# Patient Record
Sex: Male | Born: 1980 | Race: Black or African American | Hispanic: No | Marital: Single | Smoking: Former smoker
Health system: Southern US, Community
[De-identification: ages and names within clinical notes are randomized; demographics above are authoritative.]

## PROBLEM LIST (undated history)

## (undated) DIAGNOSIS — A5401 Gonococcal cystitis and urethritis, unspecified: Secondary | ICD-10-CM

## (undated) DIAGNOSIS — Z8659 Personal history of other mental and behavioral disorders: Secondary | ICD-10-CM

## (undated) DIAGNOSIS — R29898 Other symptoms and signs involving the musculoskeletal system: Secondary | ICD-10-CM

## (undated) DIAGNOSIS — F319 Bipolar disorder, unspecified: Secondary | ICD-10-CM

## (undated) DIAGNOSIS — F209 Schizophrenia, unspecified: Secondary | ICD-10-CM

## (undated) DIAGNOSIS — F259 Schizoaffective disorder, unspecified: Secondary | ICD-10-CM

## (undated) DIAGNOSIS — Z7252 High risk homosexual behavior: Secondary | ICD-10-CM

## (undated) DIAGNOSIS — H7290 Unspecified perforation of tympanic membrane, unspecified ear: Secondary | ICD-10-CM

## (undated) DIAGNOSIS — R001 Bradycardia, unspecified: Secondary | ICD-10-CM

## (undated) HISTORY — DX: Personal history of other mental and behavioral disorders: Z86.59

## (undated) HISTORY — DX: Unspecified perforation of tympanic membrane, unspecified ear: H72.90

## (undated) HISTORY — DX: Bradycardia, unspecified: R00.1

## (undated) HISTORY — DX: Gonococcal cystitis and urethritis, unspecified: A54.01

## (undated) HISTORY — DX: High risk homosexual behavior: Z72.52

## (undated) HISTORY — DX: Other symptoms and signs involving the musculoskeletal system: R29.898

---
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
## 2002-03-30 ENCOUNTER — Encounter: Admission: RE | Admit: 2002-03-30 | Discharge: 2002-03-30 | Payer: Self-pay | Admitting: Internal Medicine

## 2004-07-06 ENCOUNTER — Emergency Department (HOSPITAL_COMMUNITY): Admission: EM | Admit: 2004-07-06 | Discharge: 2004-07-06 | Payer: Self-pay | Admitting: Emergency Medicine

## 2005-01-14 ENCOUNTER — Emergency Department (HOSPITAL_COMMUNITY): Admission: EM | Admit: 2005-01-14 | Discharge: 2005-01-14 | Payer: Self-pay | Admitting: Family Medicine

## 2005-01-21 ENCOUNTER — Emergency Department (HOSPITAL_COMMUNITY): Admission: EM | Admit: 2005-01-21 | Discharge: 2005-01-21 | Payer: Self-pay | Admitting: Family Medicine

## 2005-01-22 ENCOUNTER — Ambulatory Visit: Payer: Self-pay | Admitting: Internal Medicine

## 2005-06-14 ENCOUNTER — Emergency Department (HOSPITAL_COMMUNITY): Admission: EM | Admit: 2005-06-14 | Discharge: 2005-06-14 | Payer: Self-pay | Admitting: Family Medicine

## 2005-06-23 ENCOUNTER — Emergency Department (HOSPITAL_COMMUNITY): Admission: EM | Admit: 2005-06-23 | Discharge: 2005-06-23 | Payer: Self-pay | Admitting: Family Medicine

## 2006-03-02 ENCOUNTER — Ambulatory Visit: Payer: Self-pay | Admitting: Internal Medicine

## 2006-03-02 ENCOUNTER — Ambulatory Visit (HOSPITAL_COMMUNITY): Admission: RE | Admit: 2006-03-02 | Discharge: 2006-03-02 | Payer: Self-pay | Admitting: Internal Medicine

## 2006-11-01 DIAGNOSIS — A5400 Gonococcal infection of lower genitourinary tract, unspecified: Secondary | ICD-10-CM | POA: Insufficient documentation

## 2006-11-01 DIAGNOSIS — I498 Other specified cardiac arrhythmias: Secondary | ICD-10-CM

## 2007-03-16 ENCOUNTER — Encounter (INDEPENDENT_AMBULATORY_CARE_PROVIDER_SITE_OTHER): Payer: Self-pay | Admitting: Unknown Physician Specialty

## 2007-03-16 ENCOUNTER — Ambulatory Visit: Payer: Self-pay | Admitting: Internal Medicine

## 2007-03-16 DIAGNOSIS — F250 Schizoaffective disorder, bipolar type: Secondary | ICD-10-CM

## 2007-03-16 DIAGNOSIS — F259 Schizoaffective disorder, unspecified: Secondary | ICD-10-CM

## 2007-03-16 DIAGNOSIS — Z9189 Other specified personal risk factors, not elsewhere classified: Secondary | ICD-10-CM

## 2007-03-16 HISTORY — DX: Schizoaffective disorder, unspecified: F25.9

## 2008-01-22 ENCOUNTER — Encounter (INDEPENDENT_AMBULATORY_CARE_PROVIDER_SITE_OTHER): Payer: Self-pay | Admitting: *Deleted

## 2008-01-30 ENCOUNTER — Ambulatory Visit: Payer: Self-pay

## 2008-01-31 ENCOUNTER — Encounter (INDEPENDENT_AMBULATORY_CARE_PROVIDER_SITE_OTHER): Payer: Self-pay | Admitting: *Deleted

## 2008-01-31 LAB — CONVERTED CEMR LAB
ALT: 12 units/L (ref 0–53)
Albumin: 4.5 g/dL (ref 3.5–5.2)
Alkaline Phosphatase: 47 units/L (ref 39–117)
BUN: 14 mg/dL (ref 6–23)
Benzodiazepines.: NEGATIVE
Bilirubin Urine: NEGATIVE
CO2: 24 meq/L (ref 19–32)
Calcium: 9.6 mg/dL (ref 8.4–10.5)
Chlamydia, Swab/Urine, PCR: NEGATIVE
Creatinine, Ser: 0.92 mg/dL (ref 0.40–1.50)
Creatinine,U: 159.7 mg/dL
Opiates: NEGATIVE
Potassium: 4 meq/L (ref 3.5–5.3)
Propoxyphene: NEGATIVE
Protein, ur: NEGATIVE mg/dL
Sodium: 137 meq/L (ref 135–145)
Specific Gravity, Urine: 1.021 (ref 1.005–1.03)
Urine Glucose: NEGATIVE mg/dL

## 2008-08-20 ENCOUNTER — Encounter: Payer: Self-pay | Admitting: Internal Medicine

## 2008-08-20 ENCOUNTER — Encounter (INDEPENDENT_AMBULATORY_CARE_PROVIDER_SITE_OTHER): Payer: Self-pay | Admitting: Internal Medicine

## 2008-08-20 ENCOUNTER — Ambulatory Visit: Payer: Self-pay | Admitting: Internal Medicine

## 2008-08-20 DIAGNOSIS — R197 Diarrhea, unspecified: Secondary | ICD-10-CM | POA: Insufficient documentation

## 2008-08-22 LAB — CONVERTED CEMR LAB
ALT: 18 units/L (ref 0–53)
AST: 26 units/L (ref 0–37)
Alkaline Phosphatase: 56 units/L (ref 39–117)
Basophils Absolute: 0 10*3/uL (ref 0.0–0.1)
Basophils Relative: 0 % (ref 0–1)
CO2: 22 meq/L (ref 19–32)
Chloride: 98 meq/L (ref 96–112)
Creatinine, Ser: 1.19 mg/dL (ref 0.40–1.50)
Eosinophils Absolute: 0.1 10*3/uL (ref 0.0–0.7)
Hemoglobin: 17.4 g/dL — ABNORMAL HIGH (ref 13.0–17.0)
Lymphs Abs: 1 10*3/uL (ref 0.7–4.0)
Monocytes Relative: 12 % (ref 3–12)
Neutro Abs: 4.8 10*3/uL (ref 1.7–7.7)
Neutrophils Relative %: 72 % (ref 43–77)
RDW: 13.2 % (ref 11.5–15.5)
Total Protein: 9.2 g/dL — ABNORMAL HIGH (ref 6.0–8.3)

## 2008-08-23 ENCOUNTER — Telehealth (INDEPENDENT_AMBULATORY_CARE_PROVIDER_SITE_OTHER): Payer: Self-pay | Admitting: Internal Medicine

## 2008-11-05 ENCOUNTER — Telehealth (INDEPENDENT_AMBULATORY_CARE_PROVIDER_SITE_OTHER): Payer: Self-pay | Admitting: *Deleted

## 2008-12-03 ENCOUNTER — Ambulatory Visit: Payer: Self-pay | Admitting: Internal Medicine

## 2008-12-03 ENCOUNTER — Encounter: Payer: Self-pay | Admitting: Internal Medicine

## 2008-12-03 LAB — CONVERTED CEMR LAB: GC Probe Amp, Urine: NEGATIVE

## 2008-12-04 ENCOUNTER — Encounter: Payer: Self-pay | Admitting: Internal Medicine

## 2009-04-13 ENCOUNTER — Emergency Department (HOSPITAL_COMMUNITY): Admission: EM | Admit: 2009-04-13 | Discharge: 2009-04-13 | Payer: Self-pay | Admitting: Emergency Medicine

## 2009-07-28 ENCOUNTER — Ambulatory Visit: Payer: Self-pay | Admitting: Infectious Diseases

## 2009-07-28 LAB — CONVERTED CEMR LAB: CO2: 23 meq/L (ref 19–32)

## 2010-01-29 ENCOUNTER — Ambulatory Visit: Payer: Self-pay | Admitting: Internal Medicine

## 2010-01-29 DIAGNOSIS — N342 Other urethritis: Secondary | ICD-10-CM | POA: Insufficient documentation

## 2010-01-30 LAB — CONVERTED CEMR LAB
Casts: NONE SEEN /lpf
Crystals: NONE SEEN
Ketones, ur: NEGATIVE mg/dL
Squamous Epithelial / LPF: NONE SEEN /lpf
Urine Glucose: NEGATIVE mg/dL
Urobilinogen, UA: 0.2 (ref 0.0–1.0)
pH: 7.5 (ref 5.0–8.0)

## 2010-02-17 ENCOUNTER — Ambulatory Visit: Payer: Self-pay | Admitting: Internal Medicine

## 2010-05-02 ENCOUNTER — Emergency Department (HOSPITAL_COMMUNITY): Admission: EM | Admit: 2010-05-02 | Discharge: 2010-05-02 | Payer: Self-pay | Admitting: Emergency Medicine

## 2010-12-17 NOTE — Assessment & Plan Note (Signed)
Summary: f/u labs//kg   Vital Signs:  Patient profile:   30 year old male Height:      66.75 inches (169.55 cm) Weight:      129.5 pounds (58.86 kg) BMI:     20.51 Temp:     97.6 degrees F Pulse rate:   72 / minute BP sitting:   110 / 72  (left arm) Cuff size:   regular  Vitals Entered By: Dorie Rank RN (February 17, 2010 2:53 PM) CC: check up - follow up from previous visit - wonder if needs another shot - still has some burning with urination, Depression Is Patient Diabetic? No Pain Assessment Patient in pain? no      Nutritional Status BMI of 19 -24 = normal  Does patient need assistance? Functional Status Self care Ambulation Normal   Primary Care Provider:  Clerance Lav MD  CC:  check up - follow up from previous visit - wonder if needs another shot - still has some burning with urination and Depression.  History of Present Illness: 30 yo male with medical history outlined below present to [facility] for regular follow up appointment. He has no concerns at the time, except he thinks he still has active infection of gonorrhea and chlamydia. He describes penile discharge and dysuria, no fever, chills, no abdominal or other urinary concerns. no systemic symptoms.    Depression History:      The patient denies a depressed mood most of the day and a diminished interest in his usual daily activities.  The patient denies significant weight loss, significant weight gain, insomnia, hypersomnia, psychomotor agitation, psychomotor retardation, fatigue (loss of energy), feelings of worthlessness (guilt), impaired concentration (indecisiveness), and recurrent thoughts of death or suicide.        The patient denies that he feels like life is not worth living, denies that he wishes that he were dead, and denies that he has thought about ending his life.        Comments:  taking medicine and "feel OK".   Preventive Screening-Counseling & Management  Alcohol-Tobacco     Alcohol type:  BEER     Smoking Status: quit     Year Started: AT TIMES  Caffeine-Diet-Exercise     Does Patient Exercise: yes     Type of exercise: PUSH UP     Times/week:  7  Problems Prior to Update: 1)  Other Urethritis  (ICD-597.89) 2)  Problems Related To High-risk Sexual Behavior  (ICD-V69.2) 3)  Health Maintenance Exam  (ICD-V70.0) 4)  Diarrhea  (ICD-787.91) 5)  Hx of Hx, Personal, Health Hazards Nec  (ICD-V15.89) 6)  Hx of Schizophrenia Nec, Unspecified  (ICD-295.80) 7)  Hx of Disorder, Bipolar Nos  (ICD-296.80) 8)  Gonococcal Urethritis  (ICD-098.0) 9)  Bradycardia  (ICD-427.89)  Medications Prior to Update: 1)  Seroquel  Tabs (Quetiapine Fumarate Tabs) .... Unknown Dose - Per Mental Health 2)  Azithromycin 250 Mg Tabs (Azithromycin) .... Take 4 Tabs Together At Once. 3)  Phenazo 200 Mg Tabs (Phenazopyridine Hcl) .... Take 1 Tab 2-3 Times Aday As Needed For Urinary Burning and Frequency.  Current Medications (verified): 1)  Seroquel  Tabs (Quetiapine Fumarate Tabs) .... Unknown Dose - Per Mental Health 2)  Azithromycin 250 Mg Tabs (Azithromycin) .... Take 4 Tabs Together At Once. 3)  Phenazo 200 Mg Tabs (Phenazopyridine Hcl) .... Take 1 Tab 2-3 Times Aday As Needed For Urinary Burning and Frequency.  Allergies (verified): No Known Drug Allergies  Past  History:  Past Medical History: Last updated: 01/30/2008 bradycardia - Normal EKG and CMET psychiatric disorder - unknown dx hx of urethritis - gonoccal, 5/03 H/o tympanic membrane rupture  Family History: Last updated: 01/30/2008 Non-contributory.  Social History: Last updated: 01/30/2008 Disability, on SSI Occupation:No Single Has a Girl friend and 2 daughters Current Smoker - occ smokes a cigar Occ Alcohol  Risk Factors: Exercise: yes (02/17/2010)  Risk Factors: Smoking Status: quit (02/17/2010)  Family History: Reviewed history from 01/30/2008 and no changes required. Non-contributory.  Social  History: Reviewed history from 01/30/2008 and no changes required. Disability, on SSI Occupation:No Single Has a Girl friend and 2 daughters Current Smoker - occ smokes a cigar Occ Alcohol  Review of Systems       per HPI  Physical Exam  General:  Well-developed,well-nourished,in no acute distress; alert,appropriate and cooperative throughout examination Nose:  External nasal examination shows no deformity or inflammation. Nasal mucosa are pink and moist without lesions or exudates. Mouth:  Oral mucosa and oropharynx without lesions or exudates.  Teeth in good repair. Abdomen:  Bowel sounds positive,abdomen soft and non-tender without masses, organomegaly or hernias noted. Genitalia:  no scrotal masses, no testicular masses or atrophy, no cutaneous lesions, and no urethral discharge.   Psych:  Cognition and judgment appear intact. Alert and cooperative with normal attention span and concentration. No apparent delusions, illusions, hallucinations   Impression & Recommendations:  Problem # 1:  PROBLEMS RELATED TO HIGH-RISK SEXUAL BEHAVIOR (ICD-V69.2) Will treat for gonorrhea and chlamydia today with Rocephin 250 mg IM and Zithro 1 gm by mouth x 1 dose.  Complete Medication List: 1)  Seroquel Tabs (Quetiapine fumarate tabs) .... Unknown dose - per mental health 2)  Azithromycin 250 Mg Tabs (Azithromycin) .... Take 4 tabs together at once. 3)  Phenazo 200 Mg Tabs (Phenazopyridine hcl) .... Take 1 tab 2-3 times aday as needed for urinary burning and frequency.  Patient Instructions: 1)  Please schedule a follow-up appointment in 1 year. Prescriptions: PHENAZO 200 MG TABS (PHENAZOPYRIDINE HCL) take 1 tab 2-3 times aday as needed for urinary burning and frequency.  #10 x 0   Entered and Authorized by:   Mliss Sax MD   Signed by:   Mliss Sax MD on 02/17/2010   Method used:   Print then Give to Patient   RxID:   [id number] AZITHROMYCIN 250 MG TABS (AZITHROMYCIN) take 4  tabs together at once.  #4 x 0   Entered and Authorized by:   Mliss Sax MD   Signed by:   Mliss Sax MD on 02/17/2010   Method used:   Print then Give to Patient   RxID:   [id number]    Prevention & Chronic Care Immunizations   Influenza vaccine: Fluvax 3+  (08/20/2008)   Influenza vaccine deferral: Not indicated  (02/17/2010)    Tetanus booster: Not documented   Td booster deferral: Not indicated  (02/17/2010)    Pneumococcal vaccine: Not documented  Other Screening   Smoking status: quit  (02/17/2010)   Appended Document: f/u labs//kg    Nurse Visit   Vitals Entered By: Dorie Rank RN (February 17, 2010 3:21 PM)  Allergies: No Known Drug Allergies  Medication Administration  Injection # 1:    Medication: Rocephin  250mg     Diagnosis: GONOCOCCAL URETHRITIS (ICD-098.0)    Route: IM    Site: RUOQ gluteus    Exp Date: 04/15/2012    Lot #: [id number]    Mfr: Sandoz  Patient tolerated injection without complications    Given by: Dorie Rank RN (February 17, 2010 3:23 PM)    Medication Administration  Injection # 1:    Medication: Rocephin  250mg     Diagnosis: GONOCOCCAL URETHRITIS (ICD-098.0)    Route: IM    Site: RUOQ gluteus    Exp Date: 04/15/2012    Lot #: [id number]    Mfr: Sandoz    Patient tolerated injection without complications    Given by: Dorie Rank RN (February 17, 2010 3:23 PM)

## 2010-12-17 NOTE — Assessment & Plan Note (Signed)
Summary: ACUTE-BURNING  WITH EVERY BOWEL MOVEMENT/(SHAH)/CFB   Vital Signs:  Patient profile:   30 year old male Height:      66.75 inches (169.55 cm) Weight:      131.5 pounds (59.77 kg) BMI:     20.83 Temp:     98.6 degrees F (37.00 degrees C) oral Pulse rate:   67 / minute BP sitting:   121 / 73  (left arm) Cuff size:   regular  Vitals Entered By: Krystal Eaton Duncan Dull) (January 29, 2010 9:53 AM) CC: c/o burning sensation with urination, STD check, also c/o left knee pain Is Patient Diabetic? No Pain Assessment Patient in pain? no      Nutritional Status BMI of 19 -24 = normal  Have you ever been in a relationship where you felt threatened, hurt or afraid?No   Does patient need assistance? Functional Status Self care Ambulation Normal   Primary Care Provider:  Clerance Lav MD  CC:  c/o burning sensation with urination, STD check, and also c/o left knee pain.  History of Present Illness: 30 yo ols man with PMH as described in EMR is here today for a new onset increased in frequency of urination for last 2 days.  He says that symptoms started 2 days ago. He is having burning while urination as well as increased in freq. No fevers, chills or urinary discharge noted. He did say that he had a sexual contact with her "babymom" who was suffering from "UTI".  He has been using condoms infrequently and only when they are available.  Depression History:      The patient denies a depressed mood most of the day and a diminished interest in his usual daily activities.         Preventive Screening-Counseling & Management  Alcohol-Tobacco     Alcohol type: BEER     Smoking Status: quit     Year Started: AT TIMES  Problems Prior to Update: 1)  Problems Related To High-risk Sexual Behavior  (ICD-V69.2) 2)  Health Maintenance Exam  (ICD-V70.0) 3)  Diarrhea  (ICD-787.91) 4)  Hx of Hx, Personal, Health Hazards Nec  (ICD-V15.89) 5)  Hx of Schizophrenia Nec, Unspecified   (ICD-295.80) 6)  Hx of Disorder, Bipolar Nos  (ICD-296.80) 7)  Gonococcal Urethritis  (ICD-098.0) 8)  Bradycardia  (ICD-427.89)  Medications Prior to Update: 1)  Seroquel  Tabs (Quetiapine Fumarate Tabs) .... Unknown Dose - Per Mental Health  Current Medications (verified): 1)  Seroquel  Tabs (Quetiapine Fumarate Tabs) .... Unknown Dose - Per Mental Health 2)  Azithromycin 250 Mg Tabs (Azithromycin) .... Take 4 Tabs Together At Once. 3)  Phenazo 200 Mg Tabs (Phenazopyridine Hcl) .... Take 1 Tab 2-3 Times Aday As Needed For Urinary Burning and Frequency.  Allergies (verified): No Known Drug Allergies  Past History:  Past Medical History: Last updated: 01/30/2008 bradycardia - Normal EKG and CMET psychiatric disorder - unknown dx hx of urethritis - gonoccal, 5/03 H/o tympanic membrane rupture  Family History: Last updated: 01/30/2008 Non-contributory.  Social History: Last updated: 01/30/2008 Disability, on SSI Occupation:No Single Has a Girl friend and 2 daughters Current Smoker - occ smokes a cigar Occ Alcohol  Risk Factors: Exercise: yes (08/20/2008)  Risk Factors: Smoking Status: quit (01/29/2010)  Social History: Smoking Status:  quit  Review of Systems      See HPI  Physical Exam  Additional Exam:  Gen: AOx3, in no acute distress Eyes: PERRL, EOMI ENT:MMM, No erythema noted in posterior  pharynx Neck: No JVD, No LAP Chest: CTAB with  good respiratory effort CVS: regular rhythmic rate, NO M/R/G, S1 S2 normal Abdo: soft,ND, BS+x4, Non tender and No hepatosplenomegaly Genital: circumcised penis with some watery discharge at the mouth, no ulcers or rash noted, no epidydimal or testicular tenderness noted. EXT: No odema noted Neuro: Non focal, gait is normal Skin: no rashes noted.    Impression & Recommendations:  Problem # 1:  OTHER URETHRITIS (ICD-597.89) Assessment New Patient is at high risk of having a STD given his high risk sexual activity. We  will treat him empirically for GC and Chlamydia with IM ceftriaxone and Azithromycin respectively. He was also instructed to abstain from sexual activity for 7 days. We will also obtain GC/Chlamydia probe in urine along with UA and U Culture. Most common causes are Urethritis, epidydimitis or prostatitis which were considered during the history and examination. Orders: T-Chlamydia & GC Probe, Urine ([id number]) T-Urinalysis ([id number]) T-Culture, Urine ([id number])  Problem # 2:  PROBLEMS RELATED TO HIGH-RISK SEXUAL BEHAVIOR (ICD-V69.2) Assessment: Unchanged HIV, RPR for syphilis and as mentioned above. he was given free condoms and also given adequate amount of education regarding the beenfits of protected sex.  Orders: T-Chlamydia & GC Probe, Urine ([id number]/[id number]) T-HIV Antibody  (Reflex) [id number]) T-Syphilis Test (RPR) [phone]) Rocephin  250mg  (M8413)  Problem # 3:  Hx of SCHIZOPHRENIA NEC, UNSPECIFIED (ICD-295.80) Being managed by behavioural health department.  Problem # 4:  Preventive Health Care (ICD-V70.0)  Reviewed preventive care protocols, scheduled due services, and updated immunizations.   Complete Medication List: 1)  Seroquel Tabs (Quetiapine fumarate tabs) .... Unknown dose - per mental health 2)  Azithromycin 250 Mg Tabs (Azithromycin) .... Take 4 tabs together at once. 3)  Phenazo 200 Mg Tabs (Phenazopyridine hcl) .... Take 1 tab 2-3 times aday as needed for urinary burning and frequency.  Patient Instructions: 1)  You should abstain from sexual intercourse for next 1 week. 2)  Do not involve your self in high risk sexual activities and use condom verytime you have sexual intercourse.It is important that you exercise regularly at least 20 minutes 5 times a week. If you develop chest pain, have severe difficulty breathing, or feel very tired , stop exercising immediately and seek medical attention. 3)  If you could be exposed to sexually transmitted  diseases, you should use a condom. 4)  If you are having sex and you or your partner don't want a child, use contraception. 5)  Take your antibiotic as prescribed until ALL of it is gone, but stop if you develop a rash or swelling and contact our office as soon as possible. 6)  Please schedule a follow-up appointment as needed. Prescriptions: PHENAZO 200 MG TABS (PHENAZOPYRIDINE HCL) take 1 tab 2-3 times aday as needed for urinary burning and frequency.  #10 x 0   Entered and Authorized by:   Lars Mage MD   Signed by:   Lars Mage MD on 01/29/2010   Method used:   Print then Give to Patient   RxID:   [id number] AZITHROMYCIN 250 MG TABS (AZITHROMYCIN) take 4 tabs together at once.  #4 x 0   Entered and Authorized by:   Lars Mage MD   Signed by:   Lars Mage MD on 01/29/2010   Method used:   Print then Give to Patient   RxID:   [id number]   Process Orders Check Orders Results:     [facility]: ABN not required for this insurance  Tests Sent for requisitioning (January 29, 2010 4:22 PM):     01/29/2010: [facility] -- T-Chlamydia & GC Probe, Urine [[id number]] (signed)     01/29/2010: [facility] -- T-HIV Antibody  (Reflex) [id number] (signed)     01/29/2010: [facility] -- T-Syphilis Test (RPR) [phone] (signed)     01/29/2010: [facility] -- T-Urinalysis [[id number]] (signed)     01/29/2010: [facility] -- T-Culture, Urine [[id number]] (signed)     Medication Administration  Injection # 1:    Medication: Rocephin  250mg     Diagnosis: PROBLEMS RELATED TO HIGH-RISK SEXUAL BEHAVIOR (ICD-V69.2)    Route: IM    Site: RUOQ gluteus    Exp Date: 04/2012    Lot #: [id number]    Mfr: sandoz    Patient tolerated injection without complications    Given by: Krystal Eaton Duncan Dull) (January 29, 2010 11:19 AM)  Orders Added: 1)  Est. Patient Level IV [76160] 2)   T-Chlamydia & GC Probe, Urine [[id number]/[id number]] 3)  T-HIV Antibody  (Reflex) [[id number]] 4)  T-Syphilis Test (RPR) [85462-70350] 5)  T-Urinalysis [81003-65000] 6)  T-Culture, Urine [[id number]] 7)  Rocephin  250mg  [[id number]]    Process Orders Check Orders Results:     [facility]: ABN not required for this insurance Tests Sent for requisitioning (January 29, 2010 4:22 PM):     01/29/2010: [facility] -- T-Chlamydia & GC Probe, Urine [[id number]] (signed)     01/29/2010: [facility] -- T-HIV Antibody  (Reflex) [[id number]] (signed)     01/29/2010: [facility] -- T-Syphilis Test (RPR) [[id number]] (signed)     01/29/2010: [facility] -- T-Urinalysis [[id number]] (signed)     01/29/2010: [facility] -- T-Culture, Urine [[id number]] (signed)   Prevention & Chronic Care Immunizations   Influenza vaccine: Fluvax 3+  (08/20/2008)   Influenza vaccine deferral: Deferred  (01/29/2010)    Tetanus booster: Not documented   Td booster deferral: Deferred  (01/29/2010)    Pneumococcal vaccine: Not documented  Other Screening   Smoking status: quit  (01/29/2010)

## 2011-02-23 LAB — RAPID URINE DRUG SCREEN, HOSP PERFORMED
Benzodiazepines: NOT DETECTED
Cocaine: NOT DETECTED
Tetrahydrocannabinol: POSITIVE — AB

## 2011-02-23 LAB — URINALYSIS, ROUTINE W REFLEX MICROSCOPIC
Glucose, UA: NEGATIVE mg/dL
Hgb urine dipstick: NEGATIVE
Nitrite: NEGATIVE
Specific Gravity, Urine: 1.033 — ABNORMAL HIGH (ref 1.005–1.030)
Urobilinogen, UA: 1 mg/dL (ref 0.0–1.0)

## 2011-02-24 ENCOUNTER — Encounter: Payer: Self-pay | Admitting: Internal Medicine

## 2011-03-07 ENCOUNTER — Encounter: Payer: Self-pay | Admitting: Internal Medicine

## 2011-03-11 ENCOUNTER — Encounter: Payer: Self-pay | Admitting: Internal Medicine

## 2011-03-11 ENCOUNTER — Ambulatory Visit (INDEPENDENT_AMBULATORY_CARE_PROVIDER_SITE_OTHER): Payer: Medicaid Other | Admitting: Internal Medicine

## 2011-03-11 DIAGNOSIS — A5400 Gonococcal infection of lower genitourinary tract, unspecified: Secondary | ICD-10-CM

## 2011-03-11 DIAGNOSIS — Z9189 Other specified personal risk factors, not elsewhere classified: Secondary | ICD-10-CM

## 2011-03-11 DIAGNOSIS — F2089 Other schizophrenia: Secondary | ICD-10-CM

## 2011-03-11 LAB — BASIC METABOLIC PANEL
CO2: 23 mEq/L (ref 19–32)
Chloride: 101 mEq/L (ref 96–112)
Glucose, Bld: 83 mg/dL (ref 70–99)
Potassium: 4.4 mEq/L (ref 3.5–5.3)

## 2011-03-11 LAB — CBC
HCT: 44.8 % (ref 39.0–52.0)
Hemoglobin: 14.7 g/dL (ref 13.0–17.0)
RBC: 5 MIL/uL (ref 4.22–5.81)
WBC: 6.3 10*3/uL (ref 4.0–10.5)

## 2011-03-11 NOTE — Assessment & Plan Note (Signed)
Stable on Depakote and Seroquel. Given by [facility]. No changes made. Routine labwork for drug effects, as I have now access to their data.

## 2011-03-11 NOTE — Assessment & Plan Note (Signed)
Concerned about HIV as had a history of homosexuality. Check HIV. I doubt there is data out there which would advise how long to keep testing him. He is getting tested yearly.

## 2011-03-11 NOTE — Patient Instructions (Signed)
Return in one year Doctor will call you if your labwork has any concerns

## 2011-03-11 NOTE — Progress Notes (Signed)
  Subjective:    Patient ID: Shawn Leach, male    DOB: 12-Dec-1980, 30 y.o.   MRN: [id number]  HPI 30 years old male in overall good health is here for followup. He has no other complains. Denies all ROS questions. He smokes cigars occasionally and drinks alcohol socially. He lives with his girlfriend of last six year, reports her to be the only partner. 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 He did have Gonoccocal uretheritis a  Year ago.    Review of Systems  Constitutional: Negative for fever, chills, activity change and appetite change.  HENT: Negative for nosebleeds, facial swelling, neck pain and tinnitus.   Eyes: Negative for pain, discharge and visual disturbance.  Respiratory: Negative for cough, chest tightness and shortness of breath.   Cardiovascular: Negative for chest pain and palpitations.  Gastrointestinal: Negative for nausea, vomiting, abdominal pain, blood in stool and abdominal distention.  Skin: Negative for rash.  Neurological: Negative for dizziness, seizures, weakness and headaches.  Psychiatric/Behavioral: Negative for suicidal ideas, confusion and agitation.       Objective:   Physical Exam  Constitutional: He is oriented to person, place, and time. He appears well-developed and well-nourished.  HENT:  Head: Normocephalic and atraumatic.  Right Ear: External ear normal.  Left Ear: External ear normal.  Eyes: Conjunctivae and EOM are normal. Pupils are equal, round, and reactive to light. Right eye exhibits no discharge. Left eye exhibits no discharge.  Neck: Normal range of motion. Neck supple. No thyromegaly present.  Cardiovascular: Normal rate and regular rhythm.   No murmur heard. Pulmonary/Chest: Effort normal and breath sounds normal. No respiratory distress. He has no wheezes. He has no rales.  Abdominal: Soft. Bowel sounds are normal. He exhibits no distension and no mass. There  is no tenderness. There is no rebound and no guarding.  Musculoskeletal: Normal range of motion.  Neurological: He is alert and oriented to person, place, and time. He has normal reflexes. No cranial nerve deficit. Coordination normal.  Skin: No rash noted. He is not diaphoretic. No erythema.  Psychiatric: He has a normal mood and affect. His behavior is normal. Judgment and thought content normal.          Assessment & Plan:

## 2011-03-11 NOTE — Assessment & Plan Note (Signed)
Treated a year ago. Concerned that he might have this again. I will check with urine molecular probe.

## 2011-03-12 LAB — HIV ANTIBODY (ROUTINE TESTING W REFLEX): HIV: NONREACTIVE

## 2011-03-12 LAB — GC/CHLAMYDIA PROBE AMP, URINE: GC Probe Amp, Urine: NEGATIVE

## 2012-02-10 ENCOUNTER — Ambulatory Visit (INDEPENDENT_AMBULATORY_CARE_PROVIDER_SITE_OTHER): Payer: Medicaid Other | Admitting: Internal Medicine

## 2012-02-10 ENCOUNTER — Other Ambulatory Visit (HOSPITAL_COMMUNITY)
Admission: RE | Admit: 2012-02-10 | Discharge: 2012-02-10 | Disposition: A | Discharge status: Home / Self Care | Payer: Medicaid Other | Source: Ambulatory Visit | Attending: Internal Medicine | Admitting: Internal Medicine

## 2012-02-10 ENCOUNTER — Encounter: Payer: Medicaid Other | Admitting: Internal Medicine

## 2012-02-10 ENCOUNTER — Encounter: Payer: Self-pay | Admitting: Internal Medicine

## 2012-02-10 VITALS — BP 115/78 | HR 59 | Temp 97.4°F | Ht 68.0 in | Wt 133.8 lb

## 2012-02-10 DIAGNOSIS — Z113 Encounter for screening for infections with a predominantly sexual mode of transmission: Secondary | ICD-10-CM | POA: Insufficient documentation

## 2012-02-10 DIAGNOSIS — R42 Dizziness and giddiness: Secondary | ICD-10-CM

## 2012-02-10 DIAGNOSIS — E782 Mixed hyperlipidemia: Secondary | ICD-10-CM

## 2012-02-10 DIAGNOSIS — Z202 Contact with and (suspected) exposure to infections with a predominantly sexual mode of transmission: Secondary | ICD-10-CM

## 2012-02-10 LAB — RPR

## 2012-02-10 LAB — COMPREHENSIVE METABOLIC PANEL
ALT: 18 U/L (ref 0–53)
Alkaline Phosphatase: 48 U/L (ref 39–117)
CO2: 27 mEq/L (ref 19–32)
Sodium: 141 mEq/L (ref 135–145)
Total Bilirubin: 0.5 mg/dL (ref 0.3–1.2)

## 2012-02-10 LAB — CBC
HCT: 43 % (ref 39.0–52.0)
MCHC: 33 g/dL (ref 30.0–36.0)
Platelets: 226 10*3/uL (ref 150–400)
RDW: 13.1 % (ref 11.5–15.5)
WBC: 4.5 10*3/uL (ref 4.0–10.5)

## 2012-02-10 LAB — HIV ANTIBODY (ROUTINE TESTING W REFLEX): HIV: NONREACTIVE

## 2012-02-10 NOTE — Assessment & Plan Note (Signed)
Given previous history of sexually transmitted infections, and ongoing high-risk sexual behavior, will check for common sexually transmitted diseases such as hepatitis, HIV, GC, Chlamydia, and syphilis. We'll also check CBC and basic metabolic panel.

## 2012-02-10 NOTE — Patient Instructions (Addendum)
Please take your medications as prescribed, I will call you with your lab results on [phone]

## 2012-02-10 NOTE — Progress Notes (Signed)
Patient ID: Shawn Leach, male   DOB: 1981/05/12, 31 y.o.   MRN: [id number]  HPI:   Patient is a 31 year old male with past medical history listed below, presents to the outpatient clinic for routine yearly followup, reports that he is compliant with his medications and is going to behavioral health next week for reevaluation of his schizophrenic disorder, today patient reports recently sexual activity that that is high-risk, and without protection, he would like to have a full STD workup. No other complaints today.  Review of Systems: Negative except per history of present illness  Physical Exam:  Nursing notes and vitals reviewed General:  alert, well-developed, and cooperative to examination.   Lungs:  normal respiratory effort, no accessory muscle use, normal breath sounds, no crackles, and no wheezes. Heart:  normal rate, regular rhythm, no murmurs, no gallop, and no rub.   Abdomen:  soft, non-tender, normal bowel sounds, no distention, no guarding, no rebound tenderness, no hepatomegaly, and no splenomegaly.   Extremities:  No cyanosis, clubbing, edema Neurologic:  alert & oriented X3, nonfocal exam  Meds: Medications Prior to Admission  Medication Sig Dispense Refill  . divalproex (DEPAKOTE) 500 MG 24 hr tablet Take 500 mg by mouth 2 (two) times daily.        . QUEtiapine (SEROQUEL) 300 MG tablet Take 300 mg by mouth at bedtime.         No current facility-administered medications on file as of 02/10/2012.    Allergies: Review of patient's allergies indicates no known allergies. Past Medical History  Diagnosis Date  . Bradycardia     asymptomatic, normal EKG and BMET  . History of psychiatric disorder     unknown prior diagnosis  . Gonococcal urethritis     hx in 5/03  . Tympanic membrane rupture     history of  . High risk homosexual behavior     Hx of relationship with man 2002-2005, no known disease in the partnet   No past surgical history on file. No family  history on file. History   Social History  . Marital Status: Single    Spouse Name: N/A    Number of Children: N/A  . Years of Education: N/A   Occupational History  . Not on file.   Social History Main Topics  . Smoking status: Former Smoker    Types: Cigars    Quit date: 01/14/2011  . Smokeless tobacco: Not on file  . Alcohol Use: Yes     occassional  . Drug Use: Not on file  . Sexually Active: Yes -- Male partner(s)     disabled on SSI, single, has a girlfriend (now only heterosexual) and 2 daughters,    Other Topics Concern  . Not on file   Social History Narrative  . No narrative on file

## 2012-02-11 LAB — HEPATITIS PANEL, ACUTE
HCV Ab: NEGATIVE
Hep B C IgM: NEGATIVE
Hepatitis B Surface Ag: NEGATIVE

## 2013-01-03 ENCOUNTER — Encounter: Payer: Self-pay | Admitting: Internal Medicine

## 2013-01-03 ENCOUNTER — Ambulatory Visit (HOSPITAL_COMMUNITY)
Admission: RE | Admit: 2013-01-03 | Discharge: 2013-01-03 | Disposition: A | Discharge status: Home / Self Care | Payer: Medicaid Other | Source: Ambulatory Visit | Attending: Internal Medicine | Admitting: Internal Medicine

## 2013-01-03 ENCOUNTER — Ambulatory Visit (INDEPENDENT_AMBULATORY_CARE_PROVIDER_SITE_OTHER): Payer: Medicaid Other | Admitting: Internal Medicine

## 2013-01-03 VITALS — BP 118/70 | HR 67 | Temp 97.0°F | Ht 68.0 in | Wt 142.7 lb

## 2013-01-03 DIAGNOSIS — F1010 Alcohol abuse, uncomplicated: Secondary | ICD-10-CM

## 2013-01-03 DIAGNOSIS — Z87898 Personal history of other specified conditions: Secondary | ICD-10-CM | POA: Insufficient documentation

## 2013-01-03 DIAGNOSIS — Z0000 Encounter for general adult medical examination without abnormal findings: Secondary | ICD-10-CM | POA: Insufficient documentation

## 2013-01-03 DIAGNOSIS — Z7289 Other problems related to lifestyle: Secondary | ICD-10-CM | POA: Insufficient documentation

## 2013-01-03 DIAGNOSIS — Z23 Encounter for immunization: Secondary | ICD-10-CM

## 2013-01-03 DIAGNOSIS — F209 Schizophrenia, unspecified: Secondary | ICD-10-CM

## 2013-01-03 DIAGNOSIS — R079 Chest pain, unspecified: Secondary | ICD-10-CM | POA: Insufficient documentation

## 2013-01-03 DIAGNOSIS — Z7251 High risk heterosexual behavior: Secondary | ICD-10-CM

## 2013-01-03 DIAGNOSIS — I498 Other specified cardiac arrhythmias: Secondary | ICD-10-CM

## 2013-01-03 DIAGNOSIS — F319 Bipolar disorder, unspecified: Secondary | ICD-10-CM

## 2013-01-03 DIAGNOSIS — F2089 Other schizophrenia: Secondary | ICD-10-CM

## 2013-01-03 DIAGNOSIS — Z202 Contact with and (suspected) exposure to infections with a predominantly sexual mode of transmission: Secondary | ICD-10-CM

## 2013-01-03 LAB — BASIC METABOLIC PANEL WITH GFR
BUN: 15 mg/dL (ref 6–23)
CO2: 24 mEq/L (ref 19–32)
Calcium: 9.4 mg/dL (ref 8.4–10.5)
GFR, Est African American: >89 mL/min
GFR, Est Non African American: >89 mL/min
Sodium: 135 mEq/L (ref 135–145)

## 2013-01-03 LAB — HEPATIC FUNCTION PANEL
Alkaline Phosphatase: 36 U/L — ABNORMAL LOW (ref 39–117)
Bilirubin, Direct: 0.1 mg/dL (ref 0.0–0.3)
Total Bilirubin: 0.5 mg/dL (ref 0.3–1.2)

## 2013-01-03 LAB — MAGNESIUM: Magnesium: 2 mg/dL (ref 1.5–2.5)

## 2013-01-03 NOTE — Assessment & Plan Note (Signed)
Stable. Has appointment with at [facility] on the 01/26/2013.

## 2013-01-03 NOTE — Assessment & Plan Note (Addendum)
I have counseled the patient about his risk for HIV. He does not need to check annually given that his current risk is not currently increased and his has had five negative tests. According the USPTF repeated HIV screening is only reasonable in those who are known to be at risk for HIV infection, those who are actively engaged in risky behaviors, and those who live or receive medical care in a high-prevalence setting. He does not currently have any of those and further testing is not currently indicated. I have emphasized that if his risk factors change, then we can recheck.

## 2013-01-03 NOTE — Patient Instructions (Addendum)
Please keep you appointment with Behavioral health  Please reduce alcohol intake  Please take your medications as prescribed  I will contact you if your lab work     Chronic Alcoholism Alcoholism is an addiction to alcohol. Addiction is a medical illness. It is not an one-time incident of heavy drinking that defines the disease of alcoholism.  The characteristics of addictive disease, such as alcoholism, include behaviors that the person finds pleasurable, at least initially. In alcohol addiction, drinking causes chemical changes in brain activity. This can lead to frequent cravings for alcohol. Unfortunately over time, an increased amount of alcohol is needed to produce the pleasure (tolerance). As a result, the person will start to feel uncomfortable symptoms when he or she is not drinking (withdrawal). Over time, a bad cycle develops. When painful withdrawal symptoms start to appear, alcohol is needed to make the symptoms go away. During this process, the person addicted to alcohol may become so used to the effects of alcohol that the usual signs of intoxication such as slurred speech, a staggering walk, or sleepiness may no longer be shown. Many people addicted to alcohol are able to function and even complete tasks. CAUSES   Drinking heavily and frequently.  Other factors like genetics. SYMPTOMS   Headaches.  Frequent trouble falling or staying asleep (insomnia).  Irritability.  Uncontrolled shaking or movement (tremors).  Forgetting events (brownouts) or passing out (blackouts).  Seizures or hallucinations (delirium tremens).  Problems at work or at home that are related to drinking.  Medical problems related to drinking such as heart disease, stroke, high blood pressure, diabetes, stomach ulcers, bleeding from the GI tract, and liver failure.  Trauma (falls, broken bones, automobile crashes). TREATMENT  Alcoholism usually gets worse over time and almost never gets better  without treatment. Your caregiver can help recommend a course of treatment for you depending on how severe your symptoms are and the level of your alcohol abuse. In some patients, stopping alcohol use or even decreasing use can bring about withdrawal symptoms that are dangerous or even deadly. For this reason, hospitalization is sometimes required to medically stabilize a patient.  If hospitalization is not required, but the risk of withdrawal is high, a detoxification (detox) facility may be recommended as an initial treatment step. In a detox center, medications can be given to protect against seizures and other withdrawal symptoms. Rehabilitation treatment may also be necessary. This is the process of treating the psychological and lifestyle element of addiction. There is both inpatient and outpatient rehabilitation treatment. Inpatient programs help patients through a systematic plan of psychological questioning, both individually and in groups, and at times using a "twelve-step" format. Outpatient rehabilitation programs have a similar structure and are aimed at promoting continued sobriety and preventing relapse into addiction. Make treatment decisions together with your caregiver. HOME CARE INSTRUCTIONS   If you are concerned about your alcohol use, talk to someone who can help. Trying to quit on your own is not easy. It can even be medically dangerous. This is especially true if you have been drinking heavily for a long time.  Call your caregiver, Alcoholics Anonymous, or other alcoholic treatment programs for help.  AL-ANON and ALA-TEEN are support groups for friends and family members of an alcohol or drug dependent person. These people also often need help too. For information about these organizations, check your phone directory or the internet. You can also call a local alcohol or chemical dependency treatment center. Document Released: 12/09/2004 Document Revised:  01/24/2012 Document  Reviewed: 04/17/2010 ExitCare Patient Information 2013 [place], [place].  has a concern  Please come back in 12 month

## 2013-01-03 NOTE — Assessment & Plan Note (Signed)
I have counseled the patient about excessive alcohol use. I have given him resources about the dangers of excessive alcohol use.  Plan  - Mg level  - Liver panel  - BMP

## 2013-01-03 NOTE — Progress Notes (Signed)
Patient ID: Shawn Leach, male   DOB: [birth year], 32 y.o.   MRN: [id number]  Subjective:   Patient ID: Shawn Leach male   DOB: 1981/04/18 32 y.o.   MRN: [id number]  HPI: Shawn Leach is a 32 y.o. with pms of Schizophrenia and bipolar disorder, history of high-risk behavior, presents to the clinic for routine followup. The patient also requests an HIV test.  He had a high risk sexual behavior in 2002- 2005 where he was engaged in having sex with men. However, since then, the patient reports that has been in a monogamous relationship with his wife. They have 3 children. Previous yearly tests for HIV from 2009 have been negative.  He reports that he drinks alcohol report 80-120 ounces of beer every 2-3 days. CAGE - negative.  He has no complaints today. He reports that he is compliant with his medications, including Seroquel, and Depakote. He has an upcoming appointment with his psychiatrist on 01/26/2013.  Please see the A&P for the status of the pt's chronic medical problems.    Past Medical History  Diagnosis Date  . Bradycardia     asymptomatic, normal EKG and BMET  . History of psychiatric disorder     unknown prior diagnosis  . Gonococcal urethritis     hx in 5/03  . Tympanic membrane rupture     history of  . High risk homosexual behavior     Hx of relationship with man 2002-2005, no known disease in the partnet   Current Outpatient Prescriptions  Medication Sig Dispense Refill  . divalproex (DEPAKOTE) 500 MG 24 hr tablet Take 500 mg by mouth 2 (two) times daily.        . QUEtiapine (SEROQUEL) 300 MG tablet Take 300 mg by mouth at bedtime.         No current facility-administered medications for this visit.   History reviewed. No pertinent family history. History   Social History  . Marital Status: Single    Spouse Name: N/A    Number of Children: N/A  . Years of Education: N/A   Social History Main Topics  . Smoking status: Former Smoker   Types: Cigars    Quit date: 01/14/2011  . Smokeless tobacco: None     Comment: Black and Milds  . Alcohol Use: 1.8 oz/week    3 Cans of beer per week     Comment: occassional  . Drug Use: No  . Sexually Active: Yes -- Male partner(s)     Comment: disabled on SSI, single, has a girlfriend (now only heterosexual) and 2 daughters,    Other Topics Concern  . None   Social History Narrative  . None   Review of Systems: Constitutional: Denies fever, chills, diaphoresis, appetite change and fatigue.  HEENT: Denies photophobia, eye pain, redness, hearing loss, ear pain, congestion, sore throat, rhinorrhea, sneezing, mouth sores, trouble swallowing, neck pain, neck stiffness and tinnitus.   Respiratory: Denies SOB, DOE, cough, chest tightness,  and wheezing.   Cardiovascular: Denies chest pain, palpitations and leg swelling.  Gastrointestinal: Denies nausea, vomiting, abdominal pain, diarrhea, constipation, blood in stool and abdominal distention.  Genitourinary: Denies dysuria, urgency, frequency, hematuria, flank pain and difficulty urinating.  Musculoskeletal: Denies myalgias, back pain, joint swelling, arthralgias and gait problem.  Skin: Denies pallor, rash and wound.  Neurological: Denies dizziness, seizures, syncope, weakness, light-headedness, numbness and headaches.  Hematological: Denies adenopathy. Easy bruising, personal or family bleeding history  Psychiatric/Behavioral: Denies suicidal ideation, mood changes,  confusion, nervousness, sleep disturbance and agitation  Objective:  Physical Exam: Filed Vitals:   01/03/13 0856  BP: 118/70  Pulse: 67  Temp: 97 F (36.1 C)  TempSrc: Oral  Height: 5\' 8"  (1.727 m)  Weight: 142 lb 11.2 oz (64.728 kg)  SpO2: 95%   Constitutional: Vital signs reviewed.  Patient is a well-developed and well-nourished in no acute distress and cooperative with exam. Alert and oriented x3.  Head: Normocephalic and atraumatic Ear: TM normal  bilaterally Mouth: no erythema or exudates, MMM Eyes: PERRL, EOMI, conjunctivae normal, No scleral icterus.  Neck: Supple, Trachea midline normal ROM, No JVD, mass, thyromegaly, or carotid bruit present.  Cardiovascular: RRR, S1 normal, S2 normal, no MRG, pulses symmetric and intact bilaterally Pulmonary/Chest: CTAB, no wheezes, rales, or rhonchi Abdominal: Soft. Non-tender, non-distended, bowel sounds are normal, no masses, organomegaly, or guarding present.  GU: no CVA tenderness Musculoskeletal: No joint deformities, erythema, or stiffness, ROM full and no nontender Hematology: no cervical, inginal, or axillary adenopathy.  Neurological: A&O x3, Strength is normal and symmetric bilaterally, cranial nerve II-XII are grossly intact, no focal motor deficit, sensory intact to light touch bilaterally.  Skin: Warm, dry and intact. No rash, cyanosis, or clubbing.  Psychiatric: Normal mood and affect. speech and behavior is normal. Judgment and thought content normal. Cognition and memory are normal.   Assessment & Plan:  I have discussed my assessment, and plan for the care of this patient with Dr. Dalphine Handing as documented in the problem based charting.  In brief, we will test for HIV today for screening, we'll do a baseline basic metabolic panel, and a liver panel, together with magnesium level given the history of excessive alcohol use. We will also do an EKG due to previous history of bradycardia and antipsychotic medications. He will be given a flu shot and Tdap. He'll followup in one year for routine visit.

## 2013-01-03 NOTE — Assessment & Plan Note (Signed)
His symptoms are stable. On Seroquel and Depakote. Has an appointment with the [facility] on the 01/26/2013.

## 2013-03-05 ENCOUNTER — Encounter (HOSPITAL_COMMUNITY): Payer: Self-pay | Admitting: *Deleted

## 2013-03-05 ENCOUNTER — Emergency Department (HOSPITAL_COMMUNITY)
Admission: EM | Admit: 2013-03-05 | Discharge: 2013-03-06 | Disposition: A | Discharge status: Home / Self Care | Payer: Medicaid Other | Attending: Emergency Medicine | Admitting: Emergency Medicine

## 2013-03-05 DIAGNOSIS — Z8659 Personal history of other mental and behavioral disorders: Secondary | ICD-10-CM | POA: Insufficient documentation

## 2013-03-05 DIAGNOSIS — F17200 Nicotine dependence, unspecified, uncomplicated: Secondary | ICD-10-CM | POA: Insufficient documentation

## 2013-03-05 DIAGNOSIS — F2089 Other schizophrenia: Secondary | ICD-10-CM | POA: Insufficient documentation

## 2013-03-05 DIAGNOSIS — Z8679 Personal history of other diseases of the circulatory system: Secondary | ICD-10-CM | POA: Insufficient documentation

## 2013-03-05 DIAGNOSIS — F319 Bipolar disorder, unspecified: Secondary | ICD-10-CM | POA: Insufficient documentation

## 2013-03-05 DIAGNOSIS — Z79899 Other long term (current) drug therapy: Secondary | ICD-10-CM | POA: Insufficient documentation

## 2013-03-05 HISTORY — DX: Bipolar disorder, unspecified: F31.9

## 2013-03-05 HISTORY — DX: Schizophrenia, unspecified: F20.9

## 2013-03-05 LAB — COMPREHENSIVE METABOLIC PANEL
AST: 27 U/L (ref 0–37)
Albumin: 4 g/dL (ref 3.5–5.2)
Calcium: 10.3 mg/dL (ref 8.4–10.5)
Chloride: 98 mEq/L (ref 96–112)
GFR calc Af Amer: >90 mL/min (ref >90)
GFR calc non Af Amer: >90 mL/min (ref >90)
Total Bilirubin: 0.3 mg/dL (ref 0.3–1.2)

## 2013-03-05 LAB — RAPID URINE DRUG SCREEN, HOSP PERFORMED
Amphetamines: NOT DETECTED
Barbiturates: NOT DETECTED
Tetrahydrocannabinol: POSITIVE — AB

## 2013-03-05 LAB — CBC
MCH: 28.8 pg (ref 26.0–34.0)
MCHC: 33.1 g/dL (ref 30.0–36.0)
MCV: 86.9 fL (ref 78.0–100.0)

## 2013-03-05 LAB — ACETAMINOPHEN LEVEL: Acetaminophen (Tylenol), Serum: <15 ug/mL (ref 10–30)

## 2013-03-05 MED ORDER — LORAZEPAM 1 MG PO TABS: 1.0000 mg | ORAL_TABLET | Freq: Three times a day (TID) | ORAL | Status: DC | PRN

## 2013-03-05 MED ORDER — ONDANSETRON HCL 4 MG PO TABS: 4.0000 mg | ORAL_TABLET | Freq: Three times a day (TID) | ORAL | Status: DC | PRN

## 2013-03-05 MED ORDER — IBUPROFEN 600 MG PO TABS: 600.0000 mg | ORAL_TABLET | Freq: Three times a day (TID) | ORAL | Status: DC | PRN

## 2013-03-05 MED ORDER — QUETIAPINE FUMARATE 300 MG PO TABS
300.0000 mg | ORAL_TABLET | Freq: Every day | ORAL | Status: DC
  Administered 2013-03-05: 300 mg via ORAL
  Filled 2013-03-05: qty 1

## 2013-03-05 MED ORDER — ZOLPIDEM TARTRATE 5 MG PO TABS: 5.0000 mg | ORAL_TABLET | Freq: Every evening | ORAL | Status: DC | PRN

## 2013-03-05 MED ORDER — ALUM & MAG HYDROXIDE-SIMETH 200-200-20 MG/5ML PO SUSP: 30.0000 mL | ORAL | Status: DC | PRN

## 2013-03-05 MED ORDER — NICOTINE 21 MG/24HR TD PT24: 21.0000 mg | MEDICATED_PATCH | Freq: Every day | TRANSDERMAL | Status: DC

## 2013-03-05 MED ORDER — ACETAMINOPHEN 325 MG PO TABS: 650.0000 mg | ORAL_TABLET | ORAL | Status: DC | PRN

## 2013-03-05 MED ORDER — DIVALPROEX SODIUM ER 500 MG PO TB24
500.0000 mg | ORAL_TABLET | Freq: Two times a day (BID) | ORAL | Status: DC
  Administered 2013-03-05: 500 mg via ORAL
  Filled 2013-03-05: qty 1

## 2013-03-05 NOTE — ED Notes (Signed)
[employer] here to pick up patient.  

## 2013-03-05 NOTE — ED Notes (Signed)
Non emergent dispatch contacted for transportation to take patient back home. Dispatcher stated that she would send officer to pick up patient.

## 2013-03-05 NOTE — ED Notes (Signed)
Patient denies SI and HI at discharge time.
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
## 2013-03-05 NOTE — ED Provider Notes (Signed)
Medical screening examination/treatment/procedure(s) were performed by non-physician practitioner and as supervising physician I was immediately available for consultation/collaboration.    Celene Kras, MD 03/05/13 2125
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
## 2013-03-05 NOTE — ED Provider Notes (Signed)
Pt evaluated by Telepsych who does not feel the patient needs inpatient psychiatric admission. IVC reversed. Continue with outpatient resources.   Shawn Leach B. Bernette Mayers, MD 03/05/13 [phone]

## 2013-03-06 ENCOUNTER — Other Ambulatory Visit: Payer: Self-pay | Admitting: *Deleted

## 2013-03-06 NOTE — Telephone Encounter (Signed)
Pt can no longer get meds at mental health.

## 2013-03-08 NOTE — Telephone Encounter (Signed)
I f/u with [facility] and pt is able to get his med through them.   He was last seen in December so has been out of meds.

## 2013-03-10 ENCOUNTER — Telehealth: Payer: Self-pay | Admitting: Internal Medicine

## 2013-03-10 MED ORDER — QUETIAPINE FUMARATE 300 MG PO TABS: 300.0000 mg | ORAL_TABLET | Freq: Every day | ORAL | Status: DC

## 2013-03-10 MED ORDER — DIVALPROEX SODIUM ER 500 MG PO TB24: 500.0000 mg | ORAL_TABLET | Freq: Two times a day (BID) | ORAL | Status: DC

## 2013-03-10 NOTE — Telephone Encounter (Addendum)
I received a call from the patient, indicating need for medication refill of Seroquel and Depakote.  Per refill encounter note from a few days ago, it appears the patient no longer gets these medications through [facility].  He was recently seen in the ED by telepsych, but this note did not comment on whether the patient should continue or discontinue these medications.  Since it seems that he has been on these medications long-term, and I'm concerned about abrupt withdrawal of these medications, I've sent in a 72-month refill of these medications to the patient's pharmacy.  We will need to address with the patient whether these medications should be continued long-term, or whether these need to be adjusted.  Signed, Janalyn Harder, MD 03/10/2013,2:30 PM

## 2013-03-11 NOTE — Telephone Encounter (Signed)
Noted  

## 2013-09-17 ENCOUNTER — Emergency Department (HOSPITAL_COMMUNITY)
Admission: EM | Admit: 2013-09-17 | Discharge: 2013-09-17 | Disposition: A | Discharge status: Home / Self Care | Payer: Medicaid Other | Attending: Emergency Medicine | Admitting: Emergency Medicine

## 2013-09-17 ENCOUNTER — Encounter (HOSPITAL_COMMUNITY): Payer: Self-pay | Admitting: Emergency Medicine

## 2013-09-17 DIAGNOSIS — F17200 Nicotine dependence, unspecified, uncomplicated: Secondary | ICD-10-CM | POA: Insufficient documentation

## 2013-09-17 DIAGNOSIS — Z8679 Personal history of other diseases of the circulatory system: Secondary | ICD-10-CM | POA: Insufficient documentation

## 2013-09-17 DIAGNOSIS — F319 Bipolar disorder, unspecified: Secondary | ICD-10-CM | POA: Insufficient documentation

## 2013-09-17 DIAGNOSIS — Z79899 Other long term (current) drug therapy: Secondary | ICD-10-CM | POA: Insufficient documentation

## 2013-09-17 DIAGNOSIS — M25559 Pain in unspecified hip: Secondary | ICD-10-CM | POA: Insufficient documentation

## 2013-09-17 DIAGNOSIS — Z8619 Personal history of other infectious and parasitic diseases: Secondary | ICD-10-CM | POA: Insufficient documentation

## 2013-09-17 DIAGNOSIS — R52 Pain, unspecified: Secondary | ICD-10-CM | POA: Insufficient documentation

## 2013-09-17 DIAGNOSIS — F209 Schizophrenia, unspecified: Secondary | ICD-10-CM | POA: Insufficient documentation

## 2013-09-17 DIAGNOSIS — M25551 Pain in right hip: Secondary | ICD-10-CM

## 2013-09-17 MED ORDER — MELOXICAM 15 MG PO TABS: 15.0000 mg | ORAL_TABLET | Freq: Every day | ORAL | Status: DC

## 2013-09-17 MED ORDER — DEXAMETHASONE SODIUM PHOSPHATE 10 MG/ML IJ SOLN
10.0000 mg | Freq: Once | INTRAMUSCULAR | Status: AC
  Administered 2013-09-17: 10 mg via INTRAMUSCULAR
  Filled 2013-09-17: qty 1

## 2013-09-17 MED ORDER — HYDROCODONE-ACETAMINOPHEN 5-325 MG PO TABS: 1.0000 | ORAL_TABLET | Freq: Four times a day (QID) | ORAL | Status: DC | PRN

## 2013-09-17 MED ORDER — KETOROLAC TROMETHAMINE 30 MG/ML IJ SOLN
30.0000 mg | Freq: Once | INTRAMUSCULAR | Status: AC
  Administered 2013-09-17: 30 mg via INTRAVENOUS
  Filled 2013-09-17: qty 1

## 2013-09-17 NOTE — ED Notes (Signed)
C/o right hip pain since doing excessive walking 2 days ago. Pain is "sharp".

## 2013-09-17 NOTE — ED Provider Notes (Signed)
CSN: [id number]     Arrival date & time 09/17/13  0903 History  This chart was scribed for Arthor Captain, PA working with Audree Camel, MD by Quintella Reichert, ED Scribe. This patient was seen in room TR06C/TR06C and the patient's care was started at 10:36 AM.   Chief Complaint  Patient presents with  . Hip Pain    The history is provided by the patient. No language interpreter was used.    HPI Comments: Shawn Leach is a 32 y.o. male who presents to the Emergency Department complaining of moderate-to-severe right hip pain since doing excessive walking 2 days ago.  Pt describes pain as "throbbing" and worsened with lying down, sitting, standing, walking, and stairs.  He denies h/o injuries to the hip or prior h/o pain to that area.  He attempted to treat pain with Tylenol 3, without relief.  He denies weakness, numbness, tingling, back pain, fevers, chills, SOB, CP, testicular pain, groin pain, or urinary symptoms.  He denies h/o DVT/PE or any other pertinent medical history.    Past Medical History  Diagnosis Date  . Bradycardia     asymptomatic, normal EKG and BMET  . History of psychiatric disorder     unknown prior diagnosis  . Gonococcal urethritis     hx in 5/03  . Tympanic membrane rupture     history of  . High risk homosexual behavior     Hx of relationship with man 2002-2005, no known disease in the partnet  . Bipolar 1 disorder   . Schizophrenia     History reviewed. No pertinent past surgical history.  No family history on file.   History  Substance Use Topics  . Smoking status: Current Some Day Smoker    Types: Cigars    Last Attempt to Quit: 01/14/2011  . Smokeless tobacco: Never Used     Comment: Black and Milds  . Alcohol Use: 12.6 oz/week    21 Cans of beer per week     Review of Systems  Constitutional: Negative for fever and chills.  Respiratory: Negative for shortness of breath.   Cardiovascular: Negative for chest pain.   Genitourinary: Negative for dysuria, urgency, frequency, hematuria, decreased urine volume, discharge, penile swelling, scrotal swelling, difficulty urinating, penile pain and testicular pain.  Musculoskeletal: Positive for arthralgias (right hip). Negative for back pain.  Neurological: Negative for weakness and numbness.     Allergies  Review of patient's allergies indicates no known allergies.  Home Medications   Current Outpatient Rx  Name  Route  Sig  Dispense  Refill  . acetaminophen-codeine (TYLENOL #3) 300-30 MG per tablet   Oral   Take 1 tablet by mouth once.         . divalproex (DEPAKOTE ER) 500 MG 24 hr tablet   Oral   Take 1 tablet (500 mg total) by mouth 2 (two) times daily.   60 tablet   0   . QUEtiapine (SEROQUEL) 300 MG tablet   Oral   Take 1 tablet (300 mg total) by mouth at bedtime.   30 tablet   0    BP 129/87  Pulse 84  Temp(Src) 98.1 F (36.7 C) (Oral)  SpO2 95%  Physical Exam  Nursing note and vitals reviewed. Constitutional: He is oriented to person, place, and time. He appears well-developed and well-nourished. No distress.  HENT:  Head: Normocephalic and atraumatic.  Eyes: EOM are normal.  Neck: Neck supple. No tracheal deviation present.  Cardiovascular:  Normal rate.   Pulmonary/Chest: Effort normal. No respiratory distress.  Musculoskeletal:       Right hip: He exhibits decreased range of motion, decreased strength and tenderness.  Point tenderness over greater trochanter on the right.  Pain with external rotation.  No pain with internal rotation.  ROM and strength limited secondary to pain.  Neurological: He is alert and oriented to person, place, and time.  Skin: Skin is warm and dry.  Psychiatric: He has a normal mood and affect. His behavior is normal.    ED Course  Procedures (including critical care time)  DIAGNOSTIC STUDIES: Oxygen Saturation is 95% on room air, adequate by my interpretation.    COORDINATION OF  CARE: 10:41 AM-Informed pt that symptoms are likely due to bursitis.  Discussed treatment plan which includes pain medication, anti-inflammatories, RICE treatment, and orthopedic f/u with pt at bedside and pt agreed to plan.    Labs Review Labs Reviewed - No data to display  Imaging Review No results found.  EKG Interpretation   None       MDM   1. Hip pain, acute, right    Pt with point tenderness and acute left hip pain.  Suspect pt has trochanteric bursitis.  Advise anti-inflammatories, pain medication and supportive care including RICE protocol.  F/u with orthopedist.  Will order crutches.  I personally performed the services described in this documentation, which was scribed in my presence. The recorded information has been reviewed and is accurate.     Arthor Captain, PA-C 09/23/13 [phone]

## 2013-09-24 NOTE — ED Provider Notes (Signed)
Medical screening examination/treatment/procedure(s) were performed by non-physician practitioner and as supervising physician I was immediately available for consultation/collaboration.  EKG Interpretation   None         Calix Heinbaugh T Juleen Sorrels, MD 09/24/13 0001 

## 2014-03-27 ENCOUNTER — Encounter: Payer: Medicaid Other | Admitting: Internal Medicine

## 2014-04-17 ENCOUNTER — Encounter: Payer: Self-pay | Admitting: Internal Medicine

## 2014-04-17 ENCOUNTER — Ambulatory Visit (INDEPENDENT_AMBULATORY_CARE_PROVIDER_SITE_OTHER): Payer: Medicaid Other | Admitting: Internal Medicine

## 2014-04-17 VITALS — BP 113/67 | HR 71 | Temp 97.6°F | Ht 68.0 in | Wt 135.2 lb

## 2014-04-17 DIAGNOSIS — IMO0002 Reserved for concepts with insufficient information to code with codable children: Secondary | ICD-10-CM

## 2014-04-17 DIAGNOSIS — F319 Bipolar disorder, unspecified: Secondary | ICD-10-CM

## 2014-04-17 DIAGNOSIS — F1010 Alcohol abuse, uncomplicated: Secondary | ICD-10-CM

## 2014-04-17 DIAGNOSIS — S46912A Strain of unspecified muscle, fascia and tendon at shoulder and upper arm level, left arm, initial encounter: Secondary | ICD-10-CM

## 2014-04-17 DIAGNOSIS — F2089 Other schizophrenia: Secondary | ICD-10-CM

## 2014-04-17 MED ORDER — DIVALPROEX SODIUM ER 500 MG PO TB24: 500.0000 mg | ORAL_TABLET | Freq: Two times a day (BID) | ORAL | Status: DC

## 2014-04-17 MED ORDER — QUETIAPINE FUMARATE 300 MG PO TABS: 300.0000 mg | ORAL_TABLET | Freq: Every day | ORAL | Status: DC

## 2014-04-17 NOTE — Assessment & Plan Note (Signed)
Assessment: Most likely diagnosis: Left shoulder strain in view of history of pain mainly being increased with strenuous activities involving the arms, like lifting heavy objects and painting. Also, physical examination findings reveal lateral deltoid Mild tenderness. Other differentials including osteoarthritis of the left shoulder joints are unlikely given patient's age, clinical history and physical examination findings.   Plan: 1. Labs/imaging: none 2. Therapy: Recommended conservative treatments with ice packs, rest , and avoiding inciting activities. 3. Follow up: As needed, otherwise will see him after one year.

## 2014-04-17 NOTE — Assessment & Plan Note (Signed)
Refill Depakote. Will refer to mental health through social work.

## 2014-04-17 NOTE — Assessment & Plan Note (Signed)
Patient reports that he has cut down on his alcohol use to just a few beers in a month.

## 2014-04-17 NOTE — Patient Instructions (Signed)
Please use warm compressions for your shoulder pain  You can try Aleve over the counter if your pain is worse.  If the pain persists please come back to be seen here again  We will try to find you another mental health provider Shoulder Pain The shoulder is the joint that connects your arm to your body. Muscles and band-like tissues that connect bones to muscles (tendons) hold the joint together. Shoulder pain is felt if an injury or medical problem affects one or more parts of the shoulder. HOME CARE   Put ice on the sore area.  Put ice in a plastic bag.  Place a towel between your skin and the bag.  Leave the ice on for 15-20 minutes, 03-04 times a day for the first 2 days.  Stop using cold packs if they do not help with the pain.  If you were given something to keep your shoulder from moving (sling, shoulder immobilizer), wear it as told. Only take it off to shower or bathe.  Move your arm as little as possible, but keep your hand moving to prevent puffiness (swelling).  Squeeze a soft ball or foam pad as much as possible to help prevent swelling.  Take medicine as told by your doctor. GET HELP RIGHT AWAY IF:   Your arm, hand, or fingers are numb or tingling.  Your arm, hand, or fingers are puffy (swollen), painful, or turn white or blue.  You have more pain.  You have progressing new pain in your arm, hand, or fingers.  Your hand or fingers get cold.  Your medicine does not help lessen your pain. MAKE SURE YOU:   Understand these instructions.  Will watch your condition.  Will get help right away if you are not doing well or get worse. Document Released: 04/19/2008 Document Revised: 07/26/2012 Document Reviewed: 05/15/2012 [facility] Patient Information 2014 [place], [place].

## 2014-04-17 NOTE — Assessment & Plan Note (Signed)
Patient does not take his Seroquel daily as prescribed. Instead, he takes it once a week. He reports excessive drowsiness with this medication. He also reports that his wife takes a similar medication and it becomes difficult for both of them to take care of their children after taking Seroquel. Patient was recently fired from his mental health provider due to aggressive behavior.  Plan. -Referral to social work to identify alternative mental health providers in the community. - Will refill Seroquel for the next 2 months as we sort out his referral. - No suicidal ideations.

## 2014-04-17 NOTE — Progress Notes (Signed)
Patient ID: Shawn Leach, male   DOB: 1981-08-09, 33 y.o.   MRN: [id number]   Subjective:   HPI: Shawn HoughMr.Shawn Leach is a 33 y.o. African American gentleman, with past medical history of Schizophrenia, and bipolar disorder, asymptomatic bradycardia and a history of excessive alcohol use presents for clinic followup visit.  Last OV was in 12/2012 for which there was no active medical issues. HIV test was negative.   Today he reports left shoulder pain, which has been bothering him over the last several days. He reports that his shoulder joint sometimes "locks and pops" with movement. He has recently been moving heavy objects in the house and has also been doing some painting in his house, which has worsened the pain. He describes his pain as sharp, intermittent and associated with the use of his left arm. When he does movement maneuvers of the arm, the pain reduces from 7/10 to 0/10. He has no prior history of shoulder problems. No other joint involvement. No constitutional symptoms. He has not tried any treatments.  Patient reports to me that she was recently fired from a [facility] due to aggressive behavior. Chart review reveals an episode of [facility]. Currently, he does not have an active mental health provider, and he requests me to refill his Depakote and Seroquel. He reports that he takes his Depakote daily he takes his Seroquel once weekly as opposed to daily as prescribed. He reports excessive sleepiness with Seroquel. He is not actively suicidal and is otherwise feeling well.  Kindly see the A&P for the status of the pt's chronic medical problems.  ROS: Constitutional: Denies fever, chills, diaphoresis, appetite change and fatigue.  Respiratory: Denies SOB, DOE, cough, chest tightness, and wheezing. Denies chest pain. CVS: No chest pain, palpitations and leg swelling.  GI: No abdominal pain,  nausea, vomiting, bloody stools GU: No dysuria, frequency, hematuria, or flank pain.  MSK: No back pain, joint swelling, or other joint pains.  Psych: No depression symptoms. No SI or SA.   Past Medical History  Diagnosis Date  . Bradycardia     asymptomatic, normal EKG and BMET  . History of psychiatric disorder     unknown prior diagnosis  . Gonococcal urethritis     hx in 5/03  . Tympanic membrane rupture     history of  . High risk homosexual behavior     Hx of relationship with man 2002-2005, no known disease in the partnet  . Bipolar 1 disorder   . Schizophrenia    Current Outpatient Prescriptions  Medication Sig Dispense Refill  . acetaminophen-codeine (TYLENOL #3) 300-30 MG per tablet Take 1 tablet by mouth once.      . divalproex (DEPAKOTE ER) 500 MG 24 hr tablet Take 1 tablet (500 mg total) by mouth 2 (two) times daily.  60 tablet  0  . HYDROcodone-acetaminophen (NORCO) 5-325 MG per tablet Take 1-2 tablets by mouth every 6 (six) hours as needed for pain.  20 tablet  0  . meloxicam (MOBIC) 15 MG tablet Take 1 tablet (15 mg total) by mouth daily. Take 1 daily with food.  10 tablet  0  . QUEtiapine (SEROQUEL) 300 MG tablet Take 1 tablet (300 mg total) by mouth at bedtime.  30 tablet  0   No current facility-administered medications for this visit.   No family history on file. History   Social History  . Marital Status: Single  Spouse Name: N/A    Number of Children: N/A  . Years of Education: N/A   Social History Main Topics  . Smoking status: Current Some Day Smoker    Types: Cigars    Last Attempt to Quit: 01/14/2011  . Smokeless tobacco: Never Used     Comment: Black and Milds  . Alcohol Use: 12.6 oz/week    21 Cans of beer per week  . Drug Use: No  . Sexual Activity: Yes    Partners: Female     Comment: disabled on SSI, single, has a girlfriend (now only heterosexual) and 2 daughters,    Other Topics Concern  . Not on file   Social History Narrative   . No narrative on file    Objective:  Physical Exam: Filed Vitals:   04/17/14 1543  BP: 113/67  Pulse: 71  Temp: 97.6 F (36.4 C)  TempSrc: Oral  Height: 5\' 8"  (1.727 m)  Weight: 135 lb 3.2 oz (61.326 kg)  SpO2: 95%   General: Well nourished. No acute distress.  HEENT: Normal oral mucosa. MMM.  Lungs: CTA bilaterally. Heart: RRR; no extra sounds or murmurs  Abdomen: Non-distended, normal BS, soft, nontender; no hepatosplenomegaly  Extremities: No pedal edema. No joint swelling or tenderness. Left shoulder: No joint swelling. No signs of infection or inflammation. Has full range of motion of the left shoulder joint. Arm drop sign is negative. There is an area of tenderness of the lateral deltoid area. Otherwise, he has good strength in his left upper extremity. Right upper extremity exam is unremarkable. Neurologic: Normal EOM,  Alert and oriented x3. No obvious neurologic/cranial nerve deficits.  Assessment & Plan:  I have discussed my assessment and plan  with  my attending in the clinic, Dr. Dalphine Handing as detailed under problem based charting.

## 2014-04-19 ENCOUNTER — Telehealth: Payer: Self-pay | Admitting: Licensed Clinical Social Worker

## 2014-04-19 NOTE — Telephone Encounter (Signed)
Mr. Shawn Leach was referred to CSW for assistance with finding another mental health provider.  Pt states his last appointment with [name] was in the fall of 2014.  Pt became upset with [facility] because pt states he was not informed his providers left their agency.  Pt requesting both therapist and psychiatrist.  CSW will attempt to schedule with [facility] or [facility] and [employer].  Pt does not have preference of provider or day of appointment.

## 2014-04-23 NOTE — Telephone Encounter (Signed)
CSW placed call to [facility] and [employer].  Referral completed over the phone.  Agency to call patient for scheduling.  CSW will mail letter along with ROI.

## 2014-04-24 NOTE — Progress Notes (Signed)
Case discussed with Dr.Kazibwe at the time of the visit.  We reviewed the resident's history and exam and pertinent patient test results.  I agree with the assessment, diagnosis, and plan of care documented in the resident's note.    

## 2014-04-30 ENCOUNTER — Telehealth: Payer: Self-pay | Admitting: Licensed Clinical Social Worker

## 2014-04-30 NOTE — Telephone Encounter (Signed)
CSW placed call to Mr. Shawn Leach to follow up on referral to [facility]. Pt states he attempted to make appointment but was disconnected, pt stated he needed them to set up transportation too.  Confirmed pt's address.  CSW placed call to [facility].  Scheduler states pt requested to call back to schedule.  CSW was able to schedule pt's appt to establish care and sent transportation request.  Pt's appointment 6/22 at 2:30 pm.

## 2014-05-10 NOTE — Telephone Encounter (Signed)
CSW placed call to Shawn Leach to follow up on pt's appt scheduled for 05/06/14.  Pt states the appointment on 6/22 was canceled and rescheduled for a July date.  But, pt states he is unaware of July date.  Pt requesting CSW to set up appointment and transportation for another appointment.  CSW informed Mr. Shawn Leach he would need to arrange transportation through [employer].  Utilizing a walk-in clinic would be in his best interest as maintaining scheduled appointments has been challenging.  CSW [facility], provided address and walk-in hours.  Pt aware CSW will mail information.  CSW will sign off.

## 2014-06-27 ENCOUNTER — Encounter (HOSPITAL_COMMUNITY): Payer: Self-pay | Admitting: Emergency Medicine

## 2014-06-27 ENCOUNTER — Emergency Department (HOSPITAL_COMMUNITY)
Admission: EM | Admit: 2014-06-27 | Discharge: 2014-06-28 | Disposition: A | Discharge status: Home / Self Care | Payer: Medicaid Other | Attending: Emergency Medicine | Admitting: Emergency Medicine

## 2014-06-27 DIAGNOSIS — F17200 Nicotine dependence, unspecified, uncomplicated: Secondary | ICD-10-CM | POA: Insufficient documentation

## 2014-06-27 DIAGNOSIS — Z8669 Personal history of other diseases of the nervous system and sense organs: Secondary | ICD-10-CM | POA: Diagnosis not present

## 2014-06-27 DIAGNOSIS — Z8619 Personal history of other infectious and parasitic diseases: Secondary | ICD-10-CM | POA: Insufficient documentation

## 2014-06-27 DIAGNOSIS — Z79899 Other long term (current) drug therapy: Secondary | ICD-10-CM | POA: Diagnosis not present

## 2014-06-27 DIAGNOSIS — F411 Generalized anxiety disorder: Secondary | ICD-10-CM | POA: Diagnosis not present

## 2014-06-27 DIAGNOSIS — Z8679 Personal history of other diseases of the circulatory system: Secondary | ICD-10-CM | POA: Insufficient documentation

## 2014-06-27 DIAGNOSIS — F1010 Alcohol abuse, uncomplicated: Secondary | ICD-10-CM

## 2014-06-27 DIAGNOSIS — F319 Bipolar disorder, unspecified: Secondary | ICD-10-CM | POA: Insufficient documentation

## 2014-06-27 DIAGNOSIS — F1210 Cannabis abuse, uncomplicated: Secondary | ICD-10-CM | POA: Insufficient documentation

## 2014-06-27 DIAGNOSIS — Z008 Encounter for other general examination: Secondary | ICD-10-CM | POA: Insufficient documentation

## 2014-06-27 DIAGNOSIS — F4329 Adjustment disorder with other symptoms: Secondary | ICD-10-CM

## 2014-06-27 DIAGNOSIS — IMO0002 Reserved for concepts with insufficient information to code with codable children: Secondary | ICD-10-CM | POA: Insufficient documentation

## 2014-06-27 DIAGNOSIS — R451 Restlessness and agitation: Secondary | ICD-10-CM

## 2014-06-27 DIAGNOSIS — Z8659 Personal history of other mental and behavioral disorders: Secondary | ICD-10-CM

## 2014-06-27 LAB — CBC WITH DIFFERENTIAL/PLATELET
BASOS ABS: 0 10*3/uL (ref 0.0–0.1)
Basophils Relative: 1 % (ref 0–1)
EOS ABS: 0.3 10*3/uL (ref 0.0–0.7)
Eosinophils Relative: 8 % — ABNORMAL HIGH (ref 0–5)
HCT: 40.9 % (ref 39.0–52.0)
Hemoglobin: 13.8 g/dL (ref 13.0–17.0)
LYMPHS PCT: 49 % — AB (ref 12–46)
Lymphs Abs: 1.9 10*3/uL (ref 0.7–4.0)
MCH: 29.5 pg (ref 26.0–34.0)
MCHC: 33.7 g/dL (ref 30.0–36.0)
MCV: 87.4 fL (ref 78.0–100.0)
Monocytes Absolute: 0.3 10*3/uL (ref 0.1–1.0)
Monocytes Relative: 9 % (ref 3–12)
NEUTROS PCT: 33 % — AB (ref 43–77)
Neutro Abs: 1.3 10*3/uL — ABNORMAL LOW (ref 1.7–7.7)
PLATELETS: 236 10*3/uL (ref 150–400)
RBC: 4.68 MIL/uL (ref 4.22–5.81)
RDW: 13.3 % (ref 11.5–15.5)
WBC: 3.8 10*3/uL — AB (ref 4.0–10.5)

## 2014-06-27 LAB — COMPREHENSIVE METABOLIC PANEL
ALT: 23 U/L (ref 0–53)
AST: 35 U/L (ref 0–37)
Albumin: 4.1 g/dL (ref 3.5–5.2)
Alkaline Phosphatase: 42 U/L (ref 39–117)
Anion gap: 19 — ABNORMAL HIGH (ref 5–15)
BUN: 12 mg/dL (ref 6–23)
CO2: 22 mEq/L (ref 19–32)
Calcium: 10.1 mg/dL (ref 8.4–10.5)
Chloride: 98 mEq/L (ref 96–112)
Creatinine, Ser: 1.03 mg/dL (ref 0.50–1.35)
GFR calc non Af Amer: >90 mL/min (ref >90)
GLUCOSE: 100 mg/dL — AB (ref 70–99)
Potassium: 3.9 mEq/L (ref 3.7–5.3)
SODIUM: 139 meq/L (ref 137–147)
TOTAL PROTEIN: 8.2 g/dL (ref 6.0–8.3)
Total Bilirubin: 0.5 mg/dL (ref 0.3–1.2)

## 2014-06-27 LAB — RAPID URINE DRUG SCREEN, HOSP PERFORMED
AMPHETAMINES: NOT DETECTED
BARBITURATES: NOT DETECTED
BENZODIAZEPINES: NOT DETECTED
COCAINE: NOT DETECTED
Opiates: NOT DETECTED
TETRAHYDROCANNABINOL: POSITIVE — AB

## 2014-06-27 LAB — ETHANOL: ALCOHOL ETHYL (B): 145 mg/dL — AB (ref 0–11)

## 2014-06-27 NOTE — ED Notes (Addendum)
Pt presents with c/o suicidal thoughts. Pt was BIB police, police said that when they pulled up on the property, pt was holding a knife to his neck and after talking with the police, he dropped the knife and agreed to go voluntarily with the police to the ED to request some help. Pt calm and cooperative at this time, says that he needs his medication. Pt reports that he takes seroquel and depakote. Pt admits that he has some anger issues.

## 2014-06-27 NOTE — ED Provider Notes (Signed)
CSN: [id number]     Arrival date & time 06/27/14  2058 History  This chart was scribed for non-physician practitioner working with Ward Givens, MD by Elveria Rising, ED Scribe. This patient was seen in room [facility] and the patient's care was started at 10:05 PM.   Chief Complaint  Patient presents with  . Suicidal    The history is provided by the patient. No language interpreter was used.   HPI Comments: Shawn Leach is a 33 y.o. male brought in by [facility], who presents to the Emergency Department for medical evaluation. Patient vehemently denies plans to kill or injury himself. Patient did however threaten to hurt himself with a knife during an argument. Patient reports escalation of an argument concerning gas in his car. Patient states that he held a knife to his neck to "make a point" and to initiate police involvement. Patient denies suicidal or homicidal ideation, auditory or visual hallucinations. He reports alcohol consumption today: single cup. He denies illicit drug use. Patient shares past psychiatric history, but denies admissions. Patient reports past suicidal attempt in 2006. Past attempt also resultant of patient's anger. Patient shares subsequent treatment with a therapist.    Past Medical History  Diagnosis Date  . Bradycardia     asymptomatic, normal EKG and BMET  . History of psychiatric disorder     unknown prior diagnosis  . Gonococcal urethritis     hx in 5/03  . Tympanic membrane rupture     history of  . High risk homosexual behavior     Hx of relationship with man 2002-2005, no known disease in the partnet  . Bipolar 1 disorder   . Schizophrenia    History reviewed. No pertinent past surgical history. No family history on file. History  Substance Use Topics  . Smoking status: Current Some Day Smoker    Types: Cigars    Last Attempt to Quit: 01/14/2011  . Smokeless tobacco: Never Used     Comment: Black and Milds  . Alcohol Use: 12.6 oz/week    21  Cans of beer per week    Review of Systems  Constitutional: Negative for fever and chills.  Psychiatric/Behavioral: Positive for agitation. Negative for suicidal ideas, hallucinations and self-injury.    Allergies  Review of patient's allergies indicates no known allergies.  Home Medications   Prior to Admission medications   Medication Sig Start Date End Date Taking? Authorizing Provider  divalproex (DEPAKOTE ER) 500 MG 24 hr tablet Take 1 tablet (500 mg total) by mouth 2 (two) times daily. 04/17/14  Yes Dow Adolph, MD  QUEtiapine (SEROQUEL) 300 MG tablet Take 1 tablet (300 mg total) by mouth at bedtime. 04/17/14  Yes Dow Adolph, MD   Triage Vitals: BP 110/69  Pulse 72  Temp(Src) 98.1 F (36.7 C) (Oral)  Resp 14  SpO2 94%  Physical Exam  Nursing note and vitals reviewed. Constitutional: He is oriented to person, place, and time. He appears well-developed and well-nourished. No distress.  HENT:  Head: Normocephalic and atraumatic.  Eyes: Conjunctivae and EOM are normal. No scleral icterus.  Neck: Normal range of motion.  Cardiovascular: Normal rate, regular rhythm and normal heart sounds.   Pulmonary/Chest: Effort normal and breath sounds normal. No respiratory distress. He has no wheezes. He has no rales.  Musculoskeletal: Normal range of motion.  Neurological: He is alert and oriented to person, place, and time.  Skin: Skin is warm and dry. No rash noted. He is not diaphoretic.  No erythema. No pallor.  Psychiatric: His mood appears anxious. His affect is angry. His speech is rapid and/or pressured. He is agitated. Cognition and memory are normal. He expresses no homicidal and no suicidal ideation. He expresses no suicidal plans and no homicidal plans.    ED Course  Procedures (including critical care time)  COORDINATION OF CARE: 5:39 AM- Discussed treatment plan with patient at bedside and patient agreed to plan.   Labs Review Labs Reviewed  CBC WITH  DIFFERENTIAL - Abnormal; Notable for the following:    WBC 3.8 (*)    Neutrophils Relative % 33 (*)    Neutro Abs 1.3 (*)    Lymphocytes Relative 49 (*)    Eosinophils Relative 8 (*)    All other components within normal limits  COMPREHENSIVE METABOLIC PANEL - Abnormal; Notable for the following:    Glucose, Bld 100 (*)    Anion gap 19 (*)    All other components within normal limits  URINE RAPID DRUG SCREEN (HOSP PERFORMED) - Abnormal; Notable for the following:    Tetrahydrocannabinol POSITIVE (*)    All other components within normal limits  ETHANOL - Abnormal; Notable for the following:    Alcohol, Ethyl (B) 145 (*)    All other components within normal limits  VALPROIC ACID LEVEL - Abnormal; Notable for the following:    Valproic Acid Lvl <10.0 (*)    All other components within normal limits    Imaging Review No results found.   EKG Interpretation None      MDM   Final diagnoses:  History of impulsive behavior  Agitation    Patient is a 33 year old male with a history of bipolar 1 disorder and schizophrenia who presents to the emergency department via [employer]. Patient states that he got into an argument with his girlfriend which caused him to become agitated. Patient states that he picked up a knife in this instance to "make a statement". He denies any intent of suicide with this gesture. Police, however, state that patient had knife to neck on arrival. They do state that patient was able to be calmed to the point where he dropped the knife and agreed to voluntarily come to the ED for evaluation. Patient has remained in the ED voluntarily, and very cooperative. Patient denies SI/HI. He endorses ETOH use earlier today. He has been complaint with his psychiatric medications.  Patient medically cleared and TTS currently pending. Disposition to be determined by oncoming ED provider.  I personally performed the services described in this documentation, which was scribed in my  presence. The recorded information has been reviewed and is accurate.   Filed Vitals:   06/27/14 2120 06/28/14 0124  BP: 110/69 126/82  Pulse: 72 54  Temp: 98.1 F (36.7 C) 98.2 F (36.8 C)  TempSrc: Oral Oral  Resp: 14 16  SpO2: 94% 100%     Antony MaduraKelly Karry Causer, PA-C 06/28/14 0543

## 2014-06-28 ENCOUNTER — Encounter (HOSPITAL_COMMUNITY): Payer: Self-pay | Admitting: *Deleted

## 2014-06-28 DIAGNOSIS — F438 Other reactions to severe stress: Secondary | ICD-10-CM

## 2014-06-28 DIAGNOSIS — F4329 Adjustment disorder with other symptoms: Secondary | ICD-10-CM | POA: Diagnosis present

## 2014-06-28 DIAGNOSIS — F4389 Other reactions to severe stress: Secondary | ICD-10-CM

## 2014-06-28 DIAGNOSIS — F1010 Alcohol abuse, uncomplicated: Secondary | ICD-10-CM | POA: Diagnosis present

## 2014-06-28 LAB — VALPROIC ACID LEVEL: Valproic Acid Lvl: <10 ug/mL — ABNORMAL LOW (ref 50.0–100.0)

## 2014-06-28 MED ORDER — QUETIAPINE FUMARATE 300 MG PO TABS: 300.0000 mg | ORAL_TABLET | Freq: Every day | ORAL | Status: DC

## 2014-06-28 MED ORDER — ACETAMINOPHEN 325 MG PO TABS: 650.0000 mg | ORAL_TABLET | ORAL | Status: DC | PRN

## 2014-06-28 MED ORDER — ZOLPIDEM TARTRATE 5 MG PO TABS: 5.0000 mg | ORAL_TABLET | Freq: Every evening | ORAL | Status: DC | PRN

## 2014-06-28 MED ORDER — NICOTINE 21 MG/24HR TD PT24: 21.0000 mg | MEDICATED_PATCH | Freq: Every day | TRANSDERMAL | Status: DC

## 2014-06-28 MED ORDER — IBUPROFEN 200 MG PO TABS: 600.0000 mg | ORAL_TABLET | Freq: Three times a day (TID) | ORAL | Status: DC | PRN

## 2014-06-28 MED ORDER — ONDANSETRON HCL 4 MG PO TABS: 4.0000 mg | ORAL_TABLET | Freq: Three times a day (TID) | ORAL | Status: DC | PRN

## 2014-06-28 MED ORDER — DIVALPROEX SODIUM ER 500 MG PO TB24
500.0000 mg | ORAL_TABLET | Freq: Two times a day (BID) | ORAL | Status: DC
  Filled 2014-06-28 (×2): qty 1

## 2014-06-28 MED ORDER — ALUM & MAG HYDROXIDE-SIMETH 200-200-20 MG/5ML PO SUSP: 30.0000 mL | ORAL | Status: DC | PRN

## 2014-06-28 MED ORDER — LORAZEPAM 1 MG PO TABS: 1.0000 mg | ORAL_TABLET | Freq: Three times a day (TID) | ORAL | Status: DC | PRN

## 2014-06-28 NOTE — Consult Note (Signed)
Patient seen, evaluated by me, treatment plan formulated by me. Patient can be discharged, states that he sees a therapist and plans to continue to followup with a therapist. Patient denies being suicidal, homicidal.

## 2014-06-28 NOTE — BH Assessment (Signed)
Tele Assessment Note   Shawn Leach is a 33 y.o. male who voluntarily presents to [facility] by [employer].  Police were called to a disturbance, when they arrived, pt was holding a knife to his neck threatening to harm himself.  Pt told this writer--"I was just putting on a show, they had gun so I had a knife".  He states he had no intention of harming himself and agreed to come to the emerg dept to get some help.  Pt states had an argument about gas in his car with someone and admits that he has anger issues.  Pt has 1 previous SI attempt in 2006.  Pt admits he had been drinking--1 shot and smoked marijuana.  Pt did not engage well with this Clinical research associate and wanted to speak with a psychiatrist because he said that he had to leave because he had to go to work at [employer].  Pt is able to contract for safety and wants to return home.    Axis I: Bipolar D/O, NOS  Axis II: Deferred Axis III:  Past Medical History  Diagnosis Date  . Bradycardia     asymptomatic, normal EKG and BMET  . History of psychiatric disorder     unknown prior diagnosis  . Gonococcal urethritis     hx in 5/03  . Tympanic membrane rupture     history of  . High risk homosexual behavior     Hx of relationship with man 2002-2005, no known disease in the partnet  . Bipolar 1 disorder   . Schizophrenia    Axis IV: other psychosocial or environmental problems, problems related to social environment and problems with primary support group Axis V: 41-50 serious symptoms  Past Medical History:  Past Medical History  Diagnosis Date  . Bradycardia     asymptomatic, normal EKG and BMET  . History of psychiatric disorder     unknown prior diagnosis  . Gonococcal urethritis     hx in 5/03  . Tympanic membrane rupture     history of  . High risk homosexual behavior     Hx of relationship with man 2002-2005, no known disease in the partnet  . Bipolar 1 disorder   . Schizophrenia     History reviewed. No pertinent past surgical  history.  Family History: No family history on file.  Social History:  reports that he has been smoking Cigars.  He has never used smokeless tobacco. He reports that he drinks about 12.6 ounces of alcohol per week. He reports that he uses illicit drugs (Marijuana).  Additional Social History:  Alcohol / Drug Use Pain Medications: None  Prescriptions: Depakote, Seroquel Over the Counter: None  History of alcohol / drug use?: Yes  CIWA: CIWA-Ar BP: 126/82 mmHg Pulse Rate: 54 COWS:    PATIENT STRENGTHS: (choose at least two) Work skills  Allergies: No Known Allergies  Home Medications:  (Not in a hospital admission)  OB/GYN Status:  No LMP for male patient.  General Assessment Data [facility] of Assessment: [facility] Is this a Tele or Face-to-Face Assessment?: Face-to-Face Is this an Initial Assessment or a Re-assessment for this encounter?: Initial Assessment Living Arrangements: Alone Can pt return to current living arrangement?: Yes Admission Status: Voluntary Is patient capable of signing voluntary admission?: Yes Transfer from: Acute Hospital Referral Source: MD  Medical Screening Exam [facility] Walk-in ONLY) Medical Exam completed: No Reason for MSE not completed: Other: (None )  [facility] Crisis Care Plan Living Arrangements: Alone Name  of Psychiatrist: [facility]  Name of Therapist: [facility]   Education Status Is patient currently in school?: No Current Grade: None  Highest grade of school patient has completed: None  Name of school: None  Contact person: None   Risk to self with the past 6 months Suicidal Ideation: No Suicidal Intent: No Is patient at risk for suicide?: No Suicidal Plan?: No Access to Means: No What has been your use of drugs/alcohol within the last 12 months?: Abusing: alcohol/thc  Previous Attempts/Gestures: Yes How many times?: 1 Other Self Harm Risks: None  Triggers for Past Attempts: Unpredictable Intentional  Self Injurious Behavior: None Family Suicide History: No Recent stressful life event(s): Conflict (Comment) (Argument about gas(car) and issues with police ) Persecutory voices/beliefs?: No Depression: Yes Depression Symptoms: Feeling angry/irritable Substance abuse history and/or treatment for substance abuse?: Yes Suicide prevention information given to non-admitted patients: Not applicable  Risk to Others within the past 6 months Homicidal Ideation: No Thoughts of Harm to Others: No Current Homicidal Intent: No Current Homicidal Plan: No Access to Homicidal Means: No Identified Victim: None  History of harm to others?: No Assessment of Violence: None Noted Violent Behavior Description: None  Does patient have access to weapons?: No Criminal Charges Pending?: No Does patient have a court date: No  Psychosis Hallucinations: None noted Delusions: None noted  Mental Status Report Appear/Hygiene: Disheveled;In scrubs Eye Contact: Poor Motor Activity: Unremarkable Speech: Logical/coherent;Soft Level of Consciousness: Alert;Irritable Mood: Irritable Affect: Irritable Anxiety Level: None Thought Processes: Coherent;Relevant Judgement: Impaired Orientation: Person;Place;Time;Situation Obsessive Compulsive Thoughts/Behaviors: None  Cognitive Functioning Concentration: Normal Memory: Recent Intact;Remote Intact IQ: Average Insight: Poor Impulse Control: Poor Appetite: Good Weight Loss: 0 Weight Gain: 0 Sleep: No Change Total Hours of Sleep: 6 Vegetative Symptoms: None  ADLScreening [facility] Assessment Services) Patient's cognitive ability adequate to safely complete daily activities?: Yes Patient able to express need for assistance with ADLs?: Yes Independently performs ADLs?: Yes (appropriate for developmental age)  Prior Inpatient Therapy Prior Inpatient Therapy: No Prior Therapy Dates: None  Prior Therapy Facilty/Provider(s): None  Reason for Treatment: None    Prior Outpatient Therapy Prior Outpatient Therapy: No Prior Therapy Dates: None  Prior Therapy Facilty/Provider(s): None  Reason for Treatment: None   ADL Screening (condition at time of admission) Patient's cognitive ability adequate to safely complete daily activities?: Yes Is the patient deaf or have difficulty hearing?: No Does the patient have difficulty seeing, even when wearing glasses/contacts?: No Does the patient have difficulty concentrating, remembering, or making decisions?: No Patient able to express need for assistance with ADLs?: Yes Does the patient have difficulty dressing or bathing?: No Independently performs ADLs?: Yes (appropriate for developmental age) Does the patient have difficulty walking or climbing stairs?: No Weakness of Legs: None Weakness of Arms/Hands: None  Home Assistive Devices/Equipment Home Assistive Devices/Equipment: None  Therapy Consults (therapy consults require a physician order) PT Evaluation Needed: No OT Evalulation Needed: No SLP Evaluation Needed: No Abuse/Neglect Assessment (Assessment to be complete while patient is alone) Physical Abuse: Denies Verbal Abuse: Denies Sexual Abuse: Denies Exploitation of patient/patient's resources: Denies Self-Neglect: Denies Values / Beliefs Cultural Requests During Hospitalization: None Spiritual Requests During Hospitalization: None Consults Spiritual Care Consult Needed: No Social Work Consult Needed: No Merchant navy officer (For Healthcare) Does patient have an advance directive?: No Would patient like information on creating an advanced directive?: No - patient declined information Nutrition Screen- [facility] Patient's home diet: Regular  Additional Information 1:1 In Past 12 Months?:  No CIRT Risk: No Elopement Risk: No Does patient have medical clearance?: Yes     Disposition:  Disposition Initial Assessment Completed for this Encounter: Yes Disposition of Patient:  Other dispositions (Per Alberteen SamFran Hobson, NP possible d/c after valporic acid levels) Other disposition(s): Other (Comment) (Possible d/c after valporic acid levels drawn )  Murrell ReddenSimmons, Tahni Porchia C 06/28/2014 4:39 AM

## 2014-06-28 NOTE — Consult Note (Signed)
[facility] Face-to-Face Psychiatry Consult   Reason for Consult:  Alcohol intoxication Referring Physician:  EDP  Shawn Leach is an 33 y.o. male. Total Time spent with patient: 20 minutes  Assessment: AXIS I:  Alcohol Abuse; adjustment disorder with emotional disturbance AXIS II:  Deferred AXIS III:   Past Medical History  Diagnosis Date  . Bradycardia     asymptomatic, normal EKG and BMET  . History of psychiatric disorder     unknown prior diagnosis  . Gonococcal urethritis     hx in 5/03  . Tympanic membrane rupture     history of  . High risk homosexual behavior     Hx of relationship with man 2002-2005, no known disease in the partnet  . Bipolar 1 disorder   . Schizophrenia    AXIS IV:  other psychosocial or environmental problems, problems related to social environment and problems with primary support group AXIS V:  61-70 mild symptoms  Plan:  No evidence of imminent risk to self or others at present.  Dr. Lucianne Muss assessed the patient and concurs with the plan.  Subjective:   Shawn Leach is a 33 y.o. male patient does not warrant admission.  HPI:  Patient states he was intoxicated last night and just wanted attention when he put a knife to his throat, denies prior attempts.  Denies depression, suicidal/homicidal ideations, hallucinations.  He is sober, calm, cooperative today with request to leave to get to work at 11 am. HPI Elements:   Location:  generalized. Quality:  acute. Severity:  mild. Timing:  brief. Duration:  brief. Context:  intoxicated.  Past Psychiatric History: Past Medical History  Diagnosis Date  . Bradycardia     asymptomatic, normal EKG and BMET  . History of psychiatric disorder     unknown prior diagnosis  . Gonococcal urethritis     hx in 5/03  . Tympanic membrane rupture     history of  . High risk homosexual behavior     Hx of relationship with man 2002-2005, no known disease in the partnet  . Bipolar 1 disorder   .  Schizophrenia     reports that he has been smoking Cigars.  He has never used smokeless tobacco. He reports that he drinks about 12.6 ounces of alcohol per week. He reports that he uses illicit drugs (Marijuana). History reviewed. No pertinent family history. Family History Substance Abuse: No Family Supports: No Living Arrangements: Alone Can pt return to current living arrangement?: Yes Abuse/Neglect [facility]) Physical Abuse: Denies Verbal Abuse: Denies Sexual Abuse: Denies Allergies:  No Known Allergies  ACT Assessment Complete:  Yes:    Educational Status    Risk to Self: Risk to self with the past 6 months Suicidal Ideation: No Suicidal Intent: No Is patient at risk for suicide?: No Suicidal Plan?: No Access to Means: No What has been your use of drugs/alcohol within the last 12 months?: Abusing: alcohol/thc  Previous Attempts/Gestures: Yes How many times?: 1 Other Self Harm Risks: None  Triggers for Past Attempts: Unpredictable Intentional Self Injurious Behavior: None Family Suicide History: No Recent stressful life event(s): Conflict (Comment) (Argument about gas(car) and issues with police ) Persecutory voices/beliefs?: No Depression: Yes Depression Symptoms: Feeling angry/irritable Substance abuse history and/or treatment for substance abuse?: Yes Suicide prevention information given to non-admitted patients: Not applicable  Risk to Others: Risk to Others within the past 6 months Homicidal Ideation: No Thoughts of Harm to Others: No Current Homicidal Intent: No Current Homicidal  Plan: No Access to Homicidal Means: No Identified Victim: None  History of harm to others?: No Assessment of Violence: None Noted Violent Behavior Description: None  Does patient have access to weapons?: No Criminal Charges Pending?: No Does patient have a court date: No  Abuse: Abuse/Neglect Assessment (Assessment to be complete while patient is alone) Physical Abuse: Denies Verbal  Abuse: Denies Sexual Abuse: Denies Exploitation of patient/patient's resources: Denies Self-Neglect: Denies  Prior Inpatient Therapy: Prior Inpatient Therapy Prior Inpatient Therapy: No Prior Therapy Dates: None  Prior Therapy Facilty/Provider(s): None  Reason for Treatment: None   Prior Outpatient Therapy: Prior Outpatient Therapy Prior Outpatient Therapy: No Prior Therapy Dates: None  Prior Therapy Facilty/Provider(s): None  Reason for Treatment: None   Additional Information: Additional Information 1:1 In Past 12 Months?: No CIRT Risk: No Elopement Risk: No Does patient have medical clearance?: Yes                  Objective: Blood pressure 129/75, pulse 76, temperature 98.6 F (37 C), temperature source Oral, resp. rate 16, SpO2 99.00%.There is no weight on file to calculate BMI. Results for orders placed during the hospital encounter of 06/27/14 (from the past 72 hour(s))  CBC WITH DIFFERENTIAL     Status: Abnormal   Collection Time    06/27/14  9:35 PM      Result Value Ref Range   WBC 3.8 (*) 4.0 - 10.5 K/uL   RBC 4.68  4.22 - 5.81 MIL/uL   Hemoglobin 13.8  13.0 - 17.0 g/dL   HCT 40.9  39.0 - 52.0 %   MCV 87.4  78.0 - 100.0 fL   MCH 29.5  26.0 - 34.0 pg   MCHC 33.7  30.0 - 36.0 g/dL   RDW 13.3  11.5 - 15.5 %   Platelets 236  150 - 400 K/uL   Neutrophils Relative % 33 (*) 43 - 77 %   Neutro Abs 1.3 (*) 1.7 - 7.7 K/uL   Lymphocytes Relative 49 (*) 12 - 46 %   Lymphs Abs 1.9  0.7 - 4.0 K/uL   Monocytes Relative 9  3 - 12 %   Monocytes Absolute 0.3  0.1 - 1.0 K/uL   Eosinophils Relative 8 (*) 0 - 5 %   Eosinophils Absolute 0.3  0.0 - 0.7 K/uL   Basophils Relative 1  0 - 1 %   Basophils Absolute 0.0  0.0 - 0.1 K/uL  COMPREHENSIVE METABOLIC PANEL     Status: Abnormal   Collection Time    06/27/14  9:35 PM      Result Value Ref Range   Sodium 139  137 - 147 mEq/L   Potassium 3.9  3.7 - 5.3 mEq/L   Chloride 98  96 - 112 mEq/L   CO2 22  19 - 32 mEq/L    Glucose, Bld 100 (*) 70 - 99 mg/dL   BUN 12  6 - 23 mg/dL   Creatinine, Ser 1.03  0.50 - 1.35 mg/dL   Calcium 10.1  8.4 - 10.5 mg/dL   Total Protein 8.2  6.0 - 8.3 g/dL   Albumin 4.1  3.5 - 5.2 g/dL   AST 35  0 - 37 U/L   Comment: SLIGHT HEMOLYSIS     HEMOLYSIS AT THIS LEVEL MAY AFFECT RESULT   ALT 23  0 - 53 U/L   Alkaline Phosphatase 42  39 - 117 U/L   Total Bilirubin 0.5  0.3 - 1.2 mg/dL  GFR calc non Af Amer >90  >90 mL/min   GFR calc Af Amer >90  >90 mL/min   Comment: (NOTE)     The eGFR has been calculated using the CKD EPI equation.     This calculation has not been validated in all clinical situations.     eGFR's persistently <90 mL/min signify possible Chronic Kidney     Disease.   Anion gap 19 (*) 5 - 15  ETHANOL     Status: Abnormal   Collection Time    06/27/14  9:35 PM      Result Value Ref Range   Alcohol, Ethyl (B) 145 (*) 0 - 11 mg/dL   Comment:            LOWEST DETECTABLE LIMIT FOR     SERUM ALCOHOL IS 11 mg/dL     FOR MEDICAL PURPOSES ONLY  URINE RAPID DRUG SCREEN (HOSP PERFORMED)     Status: Abnormal   Collection Time    06/27/14 10:35 PM      Result Value Ref Range   Opiates NONE DETECTED  NONE DETECTED   Cocaine NONE DETECTED  NONE DETECTED   Benzodiazepines NONE DETECTED  NONE DETECTED   Amphetamines NONE DETECTED  NONE DETECTED   Tetrahydrocannabinol POSITIVE (*) NONE DETECTED   Barbiturates NONE DETECTED  NONE DETECTED   Comment:            DRUG SCREEN FOR MEDICAL PURPOSES     ONLY.  IF CONFIRMATION IS NEEDED     FOR ANY PURPOSE, NOTIFY LAB     WITHIN 5 DAYS.                LOWEST DETECTABLE LIMITS     FOR URINE DRUG SCREEN     Drug Class       Cutoff (ng/mL)     Amphetamine      1000     Barbiturate      200     Benzodiazepine   093     Tricyclics       267     Opiates          300     Cocaine          300     THC              50  VALPROIC ACID LEVEL     Status: Abnormal   Collection Time    06/28/14  2:11 AM      Result Value  Ref Range   Valproic Acid Lvl <10.0 (*) 50.0 - 100.0 ug/mL   Comment: Performed at [facility] are reviewed and are pertinent for no medical issues noted.  Current Facility-Administered Medications  Medication Dose Route Frequency Provider Last Rate Last Dose  . acetaminophen (TYLENOL) tablet 650 mg  650 mg Oral Q4H PRN Antonietta Breach, PA-C      . alum & mag hydroxide-simeth (MAALOX/MYLANTA) 200-200-20 MG/5ML suspension 30 mL  30 mL Oral PRN Antonietta Breach, PA-C      . divalproex (DEPAKOTE ER) 24 hr tablet 500 mg  500 mg Oral BID Lurena Nida, NP      . ibuprofen (ADVIL,MOTRIN) tablet 600 mg  600 mg Oral Q8H PRN Antonietta Breach, PA-C      . LORazepam (ATIVAN) tablet 1 mg  1 mg Oral Q8H PRN Antonietta Breach, PA-C      . nicotine (NICODERM CQ - dosed in mg/24 hours)  patch 21 mg  21 mg Transdermal Daily Antonietta Breach, PA-C      . ondansetron [facility]) tablet 4 mg  4 mg Oral Q8H PRN Antonietta Breach, PA-C      . QUEtiapine (SEROQUEL) tablet 300 mg  300 mg Oral QHS Lurena Nida, NP      . zolpidem (AMBIEN) tablet 5 mg  5 mg Oral QHS PRN Antonietta Breach, PA-C       Current Outpatient Prescriptions  Medication Sig Dispense Refill  . divalproex (DEPAKOTE ER) 500 MG 24 hr tablet Take 1 tablet (500 mg total) by mouth 2 (two) times daily.  120 tablet  0  . QUEtiapine (SEROQUEL) 300 MG tablet Take 1 tablet (300 mg total) by mouth at bedtime.  60 tablet  0    Psychiatric Specialty Exam:     Blood pressure 129/75, pulse 76, temperature 98.6 F (37 C), temperature source Oral, resp. rate 16, SpO2 99.00%.There is no weight on file to calculate BMI.  General Appearance: Casual  Eye Contact::  Good  Speech:  Normal Rate  Volume:  Normal  Mood:  Euthymic  Affect:  Congruent  Thought Process:  Coherent  Orientation:  Full (Time, Place, and Person)  Thought Content:  WDL  Suicidal Thoughts:  No  Homicidal Thoughts:  No  Memory:  Immediate;   Good Recent;   Good Remote;   Good  Judgement:  Fair  Insight:   Fair  Psychomotor Activity:  Normal  Concentration:  Fair  Recall:  Good  Fund of Knowledge:Good  Language: Good  Akathisia:  No  Handed:  Right  AIMS (if indicated):     Assets:  Communication Skills Housing Intimacy Physical Health Resilience Social Support Transportation Vocational/Educational  Sleep:      Musculoskeletal: Strength & Muscle Tone: within normal limits Gait & Station: normal Patient leans: N/A  Treatment Plan Summary: Discharge home with follow-up with his regular provider.  Waylan Boga, Brenda 06/28/2014 12:13 PM

## 2014-06-28 NOTE — [facility] Suicide Risk Assessment (Signed)
Suicide Risk Assessment  Discharge Assessment     Demographic Factors:  Male  Total Time spent with patient: 20 minutes   Psychiatric Specialty Exam:     Blood pressure 129/75, pulse 76, temperature 98.6 F (37 C), temperature source Oral, resp. rate 16, SpO2 99.00%.There is no weight on file to calculate BMI.  General Appearance: Casual  Eye Contact::  Good  Speech:  Normal Rate  Volume:  Normal  Mood:  Euthymic  Affect:  Congruent  Thought Process:  Coherent  Orientation:  Full (Time, Place, and Person)  Thought Content:  WDL  Suicidal Thoughts:  No  Homicidal Thoughts:  No  Memory:  Immediate;   Good Recent;   Good Remote;   Good  Judgement:  Fair  Insight:  Fair  Psychomotor Activity:  Normal  Concentration:  Fair  Recall:  Good  Fund of Knowledge:Good  Language: Good  Akathisia:  No  Handed:  Right  AIMS (if indicated):     Assets:  Communication Skills Housing Intimacy Physical Health Resilience Social Support Transportation Vocational/Educational  Sleep:      Musculoskeletal: Strength & Muscle Tone: within normal limits Gait & Station: normal Patient leans: N/A  Mental Status Per Nursing Assessment::   On Admission:   alcohol intoxication  Current Mental Status by Physician: NA  Loss Factors: NA  Historical Factors: NA  Risk Reduction Factors:   Sense of responsibility to family, Employed, Living with another person, especially a relative, Positive social support and Positive therapeutic relationship  Continued Clinical Symptoms:  None  Cognitive Features That Contribute To Risk:  None  Suicide Risk:  Minimal: No identifiable suicidal ideation.  Patients presenting with no risk factors but with morbid ruminations; may be classified as minimal risk based on the severity of the depressive symptoms  Discharge Diagnoses:   AXIS I:  Alcohol Abuse; adjustment disorder with disturbance of emotion AXIS II:  Deferred AXIS III:   Past  Medical History  Diagnosis Date  . Bradycardia     asymptomatic, normal EKG and BMET  . History of psychiatric disorder     unknown prior diagnosis  . Gonococcal urethritis     hx in 5/03  . Tympanic membrane rupture     history of  . High risk homosexual behavior     Hx of relationship with man 2002-2005, no known disease in the partnet  . Bipolar 1 disorder   . Schizophrenia    AXIS IV:  other psychosocial or environmental problems, problems related to social environment and problems with primary support group AXIS V:  61-70 mild symptoms  Plan Of Care/Follow-up recommendations:  Activity:  as tolerated Diet:  low-sodium heart healthy diet  Is patient on multiple antipsychotic therapies at discharge:  No   Has Patient had three or more failed trials of antipsychotic monotherapy by history:  No  Recommended Plan for Multiple Antipsychotic Therapies: NA    Garwood Wentzell, PMH-NP 06/28/2014, 12:26 PM

## 2014-06-28 NOTE — ED Notes (Signed)
[id number] Expected date:  Expected time:  Means of arrival:  Comments: Tr4 [place]

## 2014-06-28 NOTE — ED Notes (Signed)
Patient agitated. Denies SI, HI, AVH. Denies feeling anxious and having feelings of depression. Patient states that he needs to get home as he has work at 4 or 5 am. Patient states "I wasn't trying to kill myself. I had a knife out because the police had guns out". Patient has not had his rx Seroquel and Depakote.

## 2014-06-30 NOTE — ED Provider Notes (Signed)
Medical screening examination/treatment/procedure(s) were performed by non-physician practitioner and as supervising physician I was immediately available for consultation/collaboration.   EKG Interpretation None      Devoria AlbeIva Kiearra Oyervides, MD, Armando GangFACEP   Ward GivensIva L Alondria Mousseau, MD 06/30/14 [phone]

## 2015-02-26 ENCOUNTER — Encounter: Payer: Self-pay | Admitting: Internal Medicine

## 2015-02-26 ENCOUNTER — Ambulatory Visit (INDEPENDENT_AMBULATORY_CARE_PROVIDER_SITE_OTHER): Payer: Medicaid Other | Admitting: Internal Medicine

## 2015-02-26 VITALS — BP 134/73 | HR 71 | Temp 98.1°F | Ht 68.0 in | Wt 133.9 lb

## 2015-02-26 DIAGNOSIS — F3170 Bipolar disorder, currently in remission, most recent episode unspecified: Secondary | ICD-10-CM

## 2015-02-26 DIAGNOSIS — F209 Schizophrenia, unspecified: Secondary | ICD-10-CM

## 2015-02-26 DIAGNOSIS — Z7251 High risk heterosexual behavior: Secondary | ICD-10-CM | POA: Diagnosis present

## 2015-02-26 DIAGNOSIS — F1010 Alcohol abuse, uncomplicated: Secondary | ICD-10-CM

## 2015-02-26 DIAGNOSIS — S46912D Strain of unspecified muscle, fascia and tendon at shoulder and upper arm level, left arm, subsequent encounter: Secondary | ICD-10-CM | POA: Diagnosis not present

## 2015-02-26 DIAGNOSIS — X58XXXD Exposure to other specified factors, subsequent encounter: Secondary | ICD-10-CM

## 2015-02-26 DIAGNOSIS — F319 Bipolar disorder, unspecified: Secondary | ICD-10-CM

## 2015-02-26 DIAGNOSIS — F259 Schizoaffective disorder, unspecified: Secondary | ICD-10-CM

## 2015-02-26 NOTE — Progress Notes (Signed)
Patient ID: Vick FreesRawmanne H Spraker, male   DOB: Jan 04, 1981, 34 y.o.   MRN: [id number]   Subjective:   HPI: Wyonia HoughMr.Machi H Rossner is a 34 y.o. African-American gentleman with bipolar disorder, alcohol dependence and Schizophrenia presents for routine visit.  He requests for an HIV test. He has no risk behavior for HIV infection, but he has been requesting for annual HIV testing. He is in a monogamous relationship with his wife of many years.  ROS: Constitutional: Denies fever, chills, diaphoresis, appetite change and fatigue.  Respiratory: Denies SOB, DOE, cough, chest tightness, and wheezing. Denies chest pain. CVS: No chest pain, palpitations and leg swelling.  GI: No abdominal pain, nausea, vomiting, bloody stools GU: No dysuria, frequency, hematuria, or flank pain.  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 Psych: Bipolar symptoms have been stable with his current treatment including Depakote ans Seroquel even though he is not very compliant with the Seroquel. Recently he has been evaluated by mental health after identifying a different clinic in the area [facility] in [place]). No SI or SA.    Objective:  Physical Exam: Filed Vitals:   02/26/15 1356  BP: 134/73  Pulse: 71  Temp: 98.1 F (36.7 C)  TempSrc: Oral  Height: 5\' 8"  (1.727 m)  Weight: 133 lb 14.4 oz (60.737 kg)  SpO2: 98%   General: Well nourished. No acute distress. Tattooes on his arms HEENT: Normal oral mucosa. MMM.  Lungs: CTA bilaterally. Heart: RRR; no extra sounds or murmurs  Abdomen: Non-distended, normal bowel sounds, soft, nontender; no hepatosplenomegaly  Extremities: No pedal edema. No joint swelling or tenderness. Neurologic: Normal EOM,  Alert and oriented x3. No obvious neurologic/cranial nerve deficits.  Assessment & Plan:  Discussed case with my attending in the clinic, Dr. Dalphine HandingBhardwaj See problem based charting.

## 2015-02-26 NOTE — Assessment & Plan Note (Signed)
Check HIV per patient request

## 2015-02-26 NOTE — Progress Notes (Signed)
Case discussed with Dr. Kazibwe soon after the resident saw the patient.  We reviewed the resident's history and exam and pertinent patient test results.  I agree with the assessment, diagnosis, and plan of care documented in the resident's note. 

## 2015-02-26 NOTE — Assessment & Plan Note (Signed)
He takes about 3-4 beers a day.  Encouraged him to quit.

## 2015-02-26 NOTE — Assessment & Plan Note (Signed)
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   Plan: 1. Labs/imaging: none 2. Therapy: I encouraged him to use OTC Tylenol as needed  3. Follow up: As needed, otherwise will see him after one year.

## 2015-02-26 NOTE — Assessment & Plan Note (Signed)
Symptoms controlled. See mental health care details under schizophrenia problem documentation.

## 2015-02-26 NOTE — Patient Instructions (Signed)
General Instructions: I will call you with results from your blood test Please take your medications as prescribed  Please come back for a follow up in our year  Please bring your medicines with you each time you come to clinic.  Medicines may include prescription medications, over-the-counter medications, herbal remedies, eye drops, vitamins, or other pills.   Progress Toward Treatment Goals:  No flowsheet data found.  Self Care Goals & Plans:  No flowsheet data found.  No flowsheet data found.   Care Management & Community Referrals:  No flowsheet data found.

## 2015-02-26 NOTE — Assessment & Plan Note (Signed)
Symptoms are stable. Now goes to [facility] in [place]. Not compliant with Seroquel to S/E including sedation.  Plan  Valproic serum level per patient request. We will fax results to his mental clinic  Follow up with mental clinic. He states he has appt with them this coming week. Will get medical records from there as well.

## 2015-02-27 LAB — HIV ANTIBODY (ROUTINE TESTING W REFLEX): HIV 1&2 Ab, 4th Generation: NONREACTIVE

## 2015-02-27 LAB — VALPROIC ACID LEVEL

## 2015-04-01 ENCOUNTER — Encounter: Payer: Self-pay | Admitting: *Deleted

## 2016-03-15 ENCOUNTER — Encounter: Payer: Self-pay | Admitting: Internal Medicine

## 2016-05-10 ENCOUNTER — Encounter: Payer: Self-pay | Admitting: Internal Medicine

## 2016-05-31 ENCOUNTER — Encounter: Payer: Self-pay | Admitting: Internal Medicine

## 2016-05-31 ENCOUNTER — Ambulatory Visit (INDEPENDENT_AMBULATORY_CARE_PROVIDER_SITE_OTHER): Payer: Medicaid Other | Admitting: Internal Medicine

## 2016-05-31 VITALS — BP 114/67 | HR 99 | Temp 98.2°F | Wt 134.9 lb

## 2016-05-31 DIAGNOSIS — F259 Schizoaffective disorder, unspecified: Secondary | ICD-10-CM | POA: Diagnosis not present

## 2016-05-31 DIAGNOSIS — F1020 Alcohol dependence, uncomplicated: Secondary | ICD-10-CM | POA: Diagnosis not present

## 2016-05-31 DIAGNOSIS — Z0000 Encounter for general adult medical examination without abnormal findings: Secondary | ICD-10-CM

## 2016-05-31 DIAGNOSIS — F1010 Alcohol abuse, uncomplicated: Secondary | ICD-10-CM

## 2016-05-31 NOTE — Assessment & Plan Note (Signed)
Pt is down to drinking about 1 12 oz beer (budlight) every day, from 2-3 drinks before. Explained side effects of chronic alcohol use.   Pt will follow up in 1 year, earlier if needed.

## 2016-05-31 NOTE — Progress Notes (Signed)
Patient ID: Shawn Leach, male   DOB: 1980/11/19, 35 y.o.   MRN: [id number]    CC: 1 year follow up for alcohol use, and psychiatric conditions HPI: [name] is a 35 y.o. man with PMH noted below, here for alcohol use, and schizophrenia/schizoaffective/bipolar?  Please see Problem List/A&P for the status of the patient's chronic medical problems   Past Medical History  Diagnosis Date  . Bradycardia     asymptomatic, normal EKG and BMET  . History of psychiatric disorder     unknown prior diagnosis  . Gonococcal urethritis     hx in 5/03  . Tympanic membrane rupture     history of  . High risk homosexual behavior     Hx of relationship with man 2002-2005, no known disease in the partnet  . Bipolar 1 disorder (HCC)   . Schizophrenia (HCC)     Review of Systems:  Negative except as per HPI No seizures  Physical Exam: Filed Vitals:   05/31/16 1522  BP: 114/67  Pulse: 99  Temp: 98.2 F (36.8 C)  TempSrc: Oral  Weight: 134 lb 14.4 oz (61.19 kg)  SpO2: 99%    General: A&O, in NAD CV: RRR, normal s1, s2, no m/r/g Resp: equal and symmetric breath sounds, no wheezing or crackles  Abdomen: soft, nontender, nondistended, +BS Skin: warm, dry, intact,  Has tattoos on arm     Assessment & Plan:   See encounters tab for problem based medical decision making. Patient discussed with Dr. Cleda DaubE. Hoffman

## 2016-05-31 NOTE — Assessment & Plan Note (Signed)
Pt used to go to [facility], but now is going to [facility]. No office records found in epic. He says his mood is good. Denies seizures.   He wanted refill of Seroquel and valproic acid. I explained that we do not really refill those meds and they need to be monitored by a psychiatrist.

## 2016-05-31 NOTE — Patient Instructions (Signed)
Thank you for your visit today Please [facility] with [facility] for your refills. Please let us know if you have any questions and/or for any acute problem  Please follow up in 1 year

## 2016-05-31 NOTE — Assessment & Plan Note (Signed)
Pt will get flu vaccine in fall.  Lipid panel when he turns 37

## 2016-06-01 NOTE — Progress Notes (Signed)
Internal Medicine Clinic Attending  Case discussed with Dr. Saraiya soon after the resident saw the patient.  We reviewed the resident's history and exam and pertinent patient test results.  I agree with the assessment, diagnosis, and plan of care documented in the resident's note.  

## 2016-06-23 ENCOUNTER — Encounter (HOSPITAL_COMMUNITY): Payer: Self-pay | Admitting: Emergency Medicine

## 2016-06-23 ENCOUNTER — Ambulatory Visit (HOSPITAL_COMMUNITY)
Admission: EM | Admit: 2016-06-23 | Discharge: 2016-06-23 | Disposition: A | Discharge status: Home / Self Care | Payer: Medicaid Other | Attending: Family Medicine | Admitting: Family Medicine

## 2016-06-23 ENCOUNTER — Ambulatory Visit (INDEPENDENT_AMBULATORY_CARE_PROVIDER_SITE_OTHER): Payer: Medicaid Other

## 2016-06-23 DIAGNOSIS — S62639B Displaced fracture of distal phalanx of unspecified finger, initial encounter for open fracture: Secondary | ICD-10-CM

## 2016-06-23 DIAGNOSIS — Z23 Encounter for immunization: Secondary | ICD-10-CM | POA: Diagnosis not present

## 2016-06-23 MED ORDER — TETANUS-DIPHTH-ACELL PERTUSSIS 5-2.5-18.5 LF-MCG/0.5 IM SUSP
0.5000 mL | Freq: Once | INTRAMUSCULAR | Status: AC
  Administered 2016-06-23: 0.5 mL via INTRAMUSCULAR

## 2016-06-23 MED ORDER — HYDROCODONE-ACETAMINOPHEN 5-325 MG PO TABS: 1.0000 | ORAL_TABLET | Freq: Four times a day (QID) | ORAL | 0 refills | Status: DC | PRN

## 2016-06-23 MED ORDER — TETANUS-DIPHTH-ACELL PERTUSSIS 5-2.5-18.5 LF-MCG/0.5 IM SUSP
INTRAMUSCULAR | Status: AC
  Filled 2016-06-23: qty 0.5

## 2016-06-23 NOTE — ED Triage Notes (Signed)
Reports hitting left thumb nail with pliers.  Blood under nail, bleeding around nail.

## 2016-06-23 NOTE — ED Provider Notes (Signed)
[facility]    CSN: [id number] Arrival date & time: 06/23/16  1722  First Provider Contact:  First MD Initiated Contact with Patient 06/23/16 1743        History   Chief Complaint No chief complaint on file.   HPI Shawn Leach is a 35 y.o. male.    Hand Injury  Location:  Finger Finger location:  L thumb Injury: yes   Mechanism of injury: crush   Crush injury:    Mechanism:  Hand tool (struck with hammer while fixing a bike .) Pain details:    Quality:  Throbbing   Severity:  Mild   Onset quality:  Sudden Dislocation: no   Foreign body present:  No foreign bodies Tetanus status:  Out of date Prior injury to area:  No Relieved by:  Nothing Worsened by:  Nothing   Past Medical History:  Diagnosis Date  . Bipolar 1 disorder (HCC)   . Bradycardia    asymptomatic, normal EKG and BMET  . Gonococcal urethritis    hx in 5/03  . High risk homosexual behavior    Hx of relationship with man 2002-2005, no known disease in the partnet  . History of psychiatric disorder    unknown prior diagnosis  . Schizophrenia (HCC)   . Tympanic membrane rupture    history of    Patient Active Problem List   Diagnosis Date Noted  . Alcohol abuse 06/28/2014  . Adjustment disorder with emotional disturbance 06/28/2014  . Left shoulder strain 04/17/2014  . Health care maintenance 01/03/2013  . Excessive drinking alcohol 01/03/2013  . History of Risky sexual behavior in 2002-2005 01/03/2013  . Schizoaffective disorder, unspecified type (HCC) 03/16/2007  . Bipolar disorder (HCC) 03/16/2007    No past surgical history on file.     Home Medications    Prior to Admission medications   Medication Sig Start Date End Date Taking? Authorizing Provider  divalproex (DEPAKOTE ER) 500 MG 24 hr tablet Take 1 tablet (500 mg total) by mouth 2 (two) times daily. 04/17/14   Dow Adolph, MD  QUEtiapine (SEROQUEL) 300 MG tablet Take 1 tablet (300 mg total) by mouth at  bedtime. 04/17/14   Dow Adolph, MD    Family History No family history on file.  Social History Social History  Substance Use Topics  . Smoking status: Former Smoker    Types: Cigars    Quit date: 01/14/2011  . Smokeless tobacco: Never Used     Comment: Black and Milds  . Alcohol use 12.6 oz/week    21 Cans of beer per week     Allergies   Review of patient's allergies indicates no known allergies.   Review of Systems Review of Systems  Constitutional: Negative.   Musculoskeletal: Positive for joint swelling.  Skin: Negative.   Neurological: Negative.   All other systems reviewed and are negative.    Physical Exam Triage Vital Signs ED Triage Vitals [06/23/16 1743]  Enc Vitals Group     BP 119/68     Pulse Rate 89     Resp 16     Temp 97.9 F (36.6 C)     Temp Source Oral     SpO2 100 %     Weight      Height      Head Circumference      Peak Flow      Pain Score      Pain Loc      Pain Edu?  Excl. in GC?    No data found.   Updated Vital Signs BP 119/68 (BP Location: Right Arm)   Pulse 89   Temp 97.9 F (36.6 C) (Oral)   Resp 16   SpO2 100%   Visual Acuity Right Eye Distance:   Left Eye Distance:   Bilateral Distance:    Right Eye Near:   Left Eye Near:    Bilateral Near:     Physical Exam  Constitutional: He is oriented to person, place, and time. He appears well-developed and well-nourished. No distress.  Musculoskeletal: Normal range of motion. He exhibits tenderness and deformity.       Hands: Neurological: He is alert and oriented to person, place, and time.  Skin: Skin is warm and dry.  Nursing note and vitals reviewed.    UC Treatments / Results  Labs (all labs ordered are listed, but only abnormal results are displayed) Labs Reviewed - No data to display  EKG  EKG Interpretation None       Radiology No results found. X-rays reviewed and report per radiologist.  Procedures Procedures (including critical  care time)  Medications Ordered in UC Medications  Tdap (BOOSTRIX) injection 0.5 mL (not administered)     Initial Impression / Assessment and Plan / UC Course  I have reviewed the triage vital signs and the nursing notes.  Pertinent labs & imaging results that were available during my care of the patient were reviewed by me and considered in my medical decision making (see chart for details).  Clinical Course      Final Clinical Impressions(s) / UC Diagnoses   Final diagnoses:  None    New Prescriptions New Prescriptions   No medications on file     Linna HoffJames D Ogle Hoeffner, MD 06/23/16 [phone]

## 2016-07-08 ENCOUNTER — Ambulatory Visit (INDEPENDENT_AMBULATORY_CARE_PROVIDER_SITE_OTHER): Payer: Medicaid Other | Admitting: Internal Medicine

## 2016-07-08 VITALS — BP 121/80 | HR 53 | Temp 97.8°F | Ht 68.0 in | Wt 133.4 lb

## 2016-07-08 DIAGNOSIS — S60112D Contusion of left thumb with damage to nail, subsequent encounter: Secondary | ICD-10-CM | POA: Diagnosis not present

## 2016-07-08 DIAGNOSIS — Z23 Encounter for immunization: Secondary | ICD-10-CM | POA: Insufficient documentation

## 2016-07-08 DIAGNOSIS — S62502G Fracture of unspecified phalanx of left thumb, subsequent encounter for fracture with delayed healing: Secondary | ICD-10-CM

## 2016-07-08 DIAGNOSIS — W228XXD Striking against or struck by other objects, subsequent encounter: Secondary | ICD-10-CM

## 2016-07-08 DIAGNOSIS — S62522G Displaced fracture of distal phalanx of left thumb, subsequent encounter for fracture with delayed healing: Secondary | ICD-10-CM | POA: Diagnosis not present

## 2016-07-08 DIAGNOSIS — Z0000 Encounter for general adult medical examination without abnormal findings: Secondary | ICD-10-CM | POA: Insufficient documentation

## 2016-07-08 MED ORDER — KETOROLAC TROMETHAMINE 30 MG/ML IJ SOLN
30.0000 mg | Freq: Once | INTRAMUSCULAR | Status: AC
Start: 2016-07-08 — End: 2016-07-08
  Administered 2016-07-08: 30 mg via INTRAMUSCULAR

## 2016-07-08 MED ORDER — NAPROXEN 500 MG PO TABS: 500.0000 mg | ORAL_TABLET | Freq: Two times a day (BID) | ORAL | 0 refills | Status: DC

## 2016-07-08 NOTE — Progress Notes (Signed)
    CC: finger pain  HPI: Mr.Lipa H Kateri PlummerMcAuley is a 35 y.o. male with PMHx of Schizoaffective Disorder and Alcohol Abuse who presents to the clinic for follow up for a communited first distal left phalangeal fracture.   Patient was seen in Urgent Care on 06/22/16 after sustaining and injury to his left thumb while fixing his bike. He was using pliers which slipped and hit his left thumb. Xrays at urgent care on 06/22/16 showed a comminuted left first distal phalangeal tuft fracture. Patient received a tetanus vaccination, was splinted, and discharged home with Hydrocodone. Patient returns today stating he has run out of his pain medication. He continues to have occasional throbbing of the finger. He had been wearing the splint, but the cushion wore off several days ago and he has no longer been wearing it. He denies fever, chills or drainage from the finger. He is able to move the finger and has normal sensation.  Past Medical History:  Diagnosis Date  . Bipolar 1 disorder (HCC)   . Bradycardia    asymptomatic, normal EKG and BMET  . Gonococcal urethritis    hx in 5/03  . High risk homosexual behavior    Hx of relationship with man 2002-2005, no known disease in the partnet  . History of psychiatric disorder    unknown prior diagnosis  . Schizophrenia (HCC)   . Tympanic membrane rupture    history of    Review of Systems: Please see pertinent ROS reviewed in HPI and problem based charting.   Physical Exam: Vitals:   07/08/16 0846  BP: 121/80  Pulse: (!) 53  Temp: 97.8 F (36.6 C)  TempSrc: Oral  SpO2: 99%  Weight: 133 lb 6.4 oz (60.5 kg)  Height: 5\' 8"  (1.727 m)   General: Vital signs reviewed.  Patient is well-developed and well-nourished, in no acute distress and cooperative with exam.  Musculoskeletal: Left first digit is edematous and tender on palpation of distal phalanx. Normal sensation. Normal capillary refill. 2+ radial pulse. No evidence of infection. Hematoma noted  beneath nail. Neurological:Sensory intact.  Skin: Warm, dry and intact. No rashes, drainage or erythema. Psychiatric: Normal mood and affect. speech and behavior is normal. Cognition and memory are normal.   DG Finger Thumb Left 06/22/16: Independently reviewed FINDINGS: Distal phalangeal tuft fracture is noted in the first digit. Some comminution is noted. Soft tissue swelling is seen as well.  Assessment & Plan:  See encounters tab for problem based medical decision making. Patient discussed with Dr. Criselda PeachesMullen

## 2016-07-08 NOTE — Assessment & Plan Note (Signed)
Received Flu shot.

## 2016-07-08 NOTE — Assessment & Plan Note (Signed)
Patient was seen in Urgent Care on 06/22/16 after sustaining and injury to his left thumb while fixing his bike. He was using pliers which slipped and hit his left thumb. Xrays at urgent care on 06/22/16 showed a comminuted left first distal phalangeal tuft fracture. Patient received a tetanus vaccination, was splinted, and discharged home with Hydrocodone. Patient returns today stating he has run out of his pain medication. He continues to have occasional throbbing of the finger. He had been wearing the splint, but the cushion wore off several days ago and he has no longer been wearing it. He denies fever, chills or drainage from the finger. He is able to move the finger and has normal sensation. On physical exam, left first digit is edematous and tender on palpation of distal phalanx. Normal sensation. Normal capillary refill. 2+ radial pulse. No evidence of infection. Hematoma noted beneath nail.   Assessment: Closed fracture of phalanx of left thumb   Plan: -Called ortho tech to apply new splint  -Immobilize for at least 3 weeks -Return in one week for repeat imaging to ensure healing -Gave Toradol 30 mg IM once for pain -Naproxen 500 mg BID WC x 10 days

## 2016-07-08 NOTE — Patient Instructions (Addendum)
Please leave splint in place for immobilization. Please return in one week for follow up and repeat xray.  Take Naproxen 500 mg twice a day with meals for pain and inflammation.   Finger Fracture Fractures of fingers are breaks in the bones of the fingers. There are many types of fractures. There are different ways of treating these fractures. Your health care provider will discuss the best way to treat your fracture. CAUSES Traumatic injury is the main cause of broken fingers. These include:  Injuries while playing sports.  Workplace injuries.  Falls. RISK FACTORS Activities that can increase your risk of finger fractures include:  Sports.  Workplace activities that involve machinery.  A condition called osteoporosis, which can make your bones less dense and cause them to fracture more easily. SIGNS AND SYMPTOMS The main symptoms of a broken finger are pain and swelling within 15 minutes after the injury. Other symptoms include:  Bruising of your finger.  Stiffness of your finger.  Numbness of your finger.  Exposed bones (compound fracture) if the fracture is severe. DIAGNOSIS  The best way to diagnose a broken bone is with X-ray imaging. Additionally, your health care provider will use this X-ray image to evaluate the position of the broken finger bones.  TREATMENT  Finger fractures can be treated with:   Nonreduction--This means the bones are in place. The finger is splinted without changing the positions of the bone pieces. The splint is usually left on for about a week to 10 days. This will depend on your fracture and what your health care provider thinks.  Closed reduction--The bones are put back into position without using surgery. The finger is then splinted.  Open reduction and internal fixation--The fracture site is opened. Then the bone pieces are fixed into place with pins or some type of hardware. This is seldom required. It depends on the severity of the  fracture. HOME CARE INSTRUCTIONS   Follow your health care provider's instructions regarding activities, exercises, and physical therapy.  Only take over-the-counter or prescription medicines for pain, discomfort, or fever as directed by your health care provider. SEEK MEDICAL CARE IF: You have pain or swelling that limits the motion or use of your fingers. SEEK IMMEDIATE MEDICAL CARE IF:  Your finger becomes numb. MAKE SURE YOU:   Understand these instructions.  Will watch your condition.  Will get help right away if you are not doing well or get worse.   This information is not intended to replace advice given to you by your health care provider. Make sure you discuss any questions you have with your health care provider.   Document Released: 02/13/2001 Document Revised: 08/22/2013 Document Reviewed: 06/13/2013 Elsevier Interactive Patient Education [employer]2016 Elsevier Inc.

## 2016-07-09 NOTE — Progress Notes (Signed)
Internal Medicine Clinic Attending  Case discussed with Dr. Burns at the time of the visit.  We reviewed the resident's history and exam and pertinent patient test results.  I agree with the assessment, diagnosis, and plan of care documented in the resident's note.  

## 2016-07-22 ENCOUNTER — Ambulatory Visit (INDEPENDENT_AMBULATORY_CARE_PROVIDER_SITE_OTHER): Payer: Medicaid Other | Admitting: Internal Medicine

## 2016-07-22 DIAGNOSIS — S62522D Displaced fracture of distal phalanx of left thumb, subsequent encounter for fracture with routine healing: Secondary | ICD-10-CM

## 2016-07-22 DIAGNOSIS — X58XXXD Exposure to other specified factors, subsequent encounter: Secondary | ICD-10-CM

## 2016-07-22 DIAGNOSIS — S62502G Fracture of unspecified phalanx of left thumb, subsequent encounter for fracture with delayed healing: Secondary | ICD-10-CM

## 2016-07-22 NOTE — Progress Notes (Signed)
    CC: Comminuted first distal phalangeal tuft fracture  HPI: [name] is a 35 y.o. man with PMH noted below here for follow up of his Comminuted first distal phalangeal tuft fracture  Please see Problem List/A&P for the status of the patient's chronic medical problems   Past Medical History:  Diagnosis Date  . Bipolar 1 disorder (HCC)   . Bradycardia    asymptomatic, normal EKG and BMET  . Gonococcal urethritis    hx in 5/03  . High risk homosexual behavior    Hx of relationship with man 2002-2005, no known disease in the partnet  . History of psychiatric disorder    unknown prior diagnosis  . Schizophrenia (HCC)   . Tympanic membrane rupture    history of    Review of Systems: Denies fevers, chills Denies any drainage, increased pain, swelling or restricted ROm of his thumb. Has been wearing splint for last 2-3 weeks  Physical Exam: Vitals:   07/22/16 1000  BP: 107/73  Pulse: (!) 50  Temp: 97.8 F (36.6 C)  TempSrc: Oral  SpO2: 100%  Weight: 134 lb (60.8 kg)  Height: 5\' 5"  (1.651 m)    General: A&O, in NAD CV: RRR, normal s1, s2, no m/r/g,  Resp: equal and symmetric breath sounds, no wheezing or crackles   Left hand: was wearing cast. Cast removed. Thumb has good ROM both active and passive. No signs of erythema, warmth or swelling   Assessment & Plan:   See encounters tab for problem based medical decision making. Patient discussed with Dr. Criselda PeachesMullen

## 2016-07-22 NOTE — Patient Instructions (Signed)
Thank you for your visit today   Please avoid heavy work with the thumb and avoid strenuous activities   Please come back in 2 weeks for another check up

## 2016-07-22 NOTE — Assessment & Plan Note (Addendum)
Patient is here for follow up of his Comminuted first distal phalangeal tuft fracture. He says he has been wearing a cast day and night. He was interested in getting an xray. He says his pain is better.   On exam, Thumb has good ROM both active and passive. No signs of erythema, warmth or swelling . Denies pain with active and passive motion.  Plan: -advised to remove cast now, and resume normal activities, but no heavy lifting or strenuous activities with the left thumb.  -RTC in 2 weeks to reassess symptoms

## 2016-07-26 ENCOUNTER — Telehealth: Payer: Self-pay | Admitting: *Deleted

## 2016-07-26 NOTE — Progress Notes (Signed)
Internal Medicine Clinic Attending  Case discussed with Dr. Saraiya at the time of the visit.  We reviewed the resident's history and exam and pertinent patient test results.  I agree with the assessment, diagnosis, and plan of care documented in the resident's note.  

## 2016-07-26 NOTE — Telephone Encounter (Signed)
Received call from pt asking if physician he saw on was going to call in something for his thumb.  Pt informed that per MD notes, he wasn't having any pain and to resume normal activities with no heavy lifting or strenuous activities involving left thumb and f/u in 2 wks, therefore no rx was pain was intended, pt agreed with plan-phone call complete.Criss AlvineGoldston, Darlene Cassady9/11/201710:34 AM

## 2016-08-05 ENCOUNTER — Ambulatory Visit (INDEPENDENT_AMBULATORY_CARE_PROVIDER_SITE_OTHER): Payer: Medicaid Other | Admitting: Internal Medicine

## 2016-08-05 VITALS — BP 115/68 | HR 50 | Temp 98.1°F | Ht 65.0 in | Wt 131.4 lb

## 2016-08-05 DIAGNOSIS — S62502D Fracture of unspecified phalanx of left thumb, subsequent encounter for fracture with routine healing: Secondary | ICD-10-CM

## 2016-08-05 DIAGNOSIS — S62502G Fracture of unspecified phalanx of left thumb, subsequent encounter for fracture with delayed healing: Secondary | ICD-10-CM

## 2016-08-05 DIAGNOSIS — X58XXXD Exposure to other specified factors, subsequent encounter: Secondary | ICD-10-CM

## 2016-08-05 NOTE — Patient Instructions (Addendum)
Please continue to take your medications as prescribed and refill your psychiatric medications at your soonest convenience.  Remain careful when working with you hands to avoid re-injuring your thumb!

## 2016-08-05 NOTE — Progress Notes (Signed)
   CC: Left thumb fracture follow up  HPI:  Mr.Crespin H Kateri PlummerMcAuley is a 35 y.o. male with PMHx detailed below presenting for follow up recheck of his fractured distal left thumb. His pain is almost completely gone and he has no difficulties using his left thumb or hand. Some bruising under his nail and tenderness to palpation but overall healing very well.  See problem based assessment and plan below for additional details.  Past Medical History:  Diagnosis Date  . Bipolar 1 disorder (HCC)   . Bradycardia    asymptomatic, normal EKG and BMET  . Gonococcal urethritis    hx in 5/03  . High risk homosexual behavior    Hx of relationship with man 2002-2005, no known disease in the partnet  . History of psychiatric disorder    unknown prior diagnosis  . Schizophrenia (HCC)   . Tympanic membrane rupture    history of    Review of Systems: Review of Systems  Constitutional: Negative for chills and fever.  Respiratory: Negative for shortness of breath.   Cardiovascular: Negative for chest pain.  Gastrointestinal: Negative for abdominal pain.  Musculoskeletal: Negative for joint pain and myalgias.    Physical Exam: Vitals:   08/05/16 1050  BP: 115/68  Pulse: (!) 50  Temp: 98.1 F (36.7 C)  TempSrc: Oral  SpO2: 99%  Weight: 131 lb 6.4 oz (59.6 kg)  Height: 5\' 5"  (1.651 m)   Body mass index is 21.87 kg/m. GENERAL- Gentleman sitting comfortably in exam room chair, alert, in no distress CARDIAC- Regular rate and rhythm, no murmurs, rubs or gallops. RESP- Clear to ascultation bilaterally, no wheezing or crackles, normal work of breathing EXTREMITIES- Normal bulk and range of motion, no edema, 2+ peripheral pulses, noted bruising underneath nail of the left thumb, mild tenderness to palpation, no swelling  SKIN- Warm, dry, intact, without visible rash PSYCH- Appropriate affect, clear speech, thoughts linear and goal-directed  Assessment & Plan:   See encounters tab for problem  based medical decision making.  Patient seen with Dr. Heide SparkNarendra

## 2016-08-05 NOTE — Assessment & Plan Note (Addendum)
Complete resolution of fracture, minimal-to-no pain , not requiring any pain medications, no swelling, full range of motion and has returned to normal activity. Minimal nailbed bruising present with mild tenderness to palpation should continue to heal nicely.  Plan:  - Advised to remain careful at work to avoid reinjury - Naproxen or OTC medications as needed for pain - Follow up at PCP visit next year

## 2016-08-11 NOTE — Progress Notes (Signed)
Internal Medicine Clinic Attending  I saw and evaluated the patient.  I personally confirmed the key portions of the history and exam documented by Dr. Johnson and I reviewed pertinent patient test results.  The assessment, diagnosis, and plan were formulated together and I agree with the documentation in the resident's note.  

## 2017-02-21 ENCOUNTER — Ambulatory Visit (INDEPENDENT_AMBULATORY_CARE_PROVIDER_SITE_OTHER): Payer: Medicaid Other | Admitting: Internal Medicine

## 2017-02-21 ENCOUNTER — Encounter: Payer: Self-pay | Admitting: Internal Medicine

## 2017-02-21 ENCOUNTER — Encounter (INDEPENDENT_AMBULATORY_CARE_PROVIDER_SITE_OTHER): Payer: Self-pay

## 2017-02-21 VITALS — BP 118/66 | HR 76 | Temp 98.2°F | Ht 65.0 in | Wt 136.4 lb

## 2017-02-21 DIAGNOSIS — Z9189 Other specified personal risk factors, not elsewhere classified: Secondary | ICD-10-CM | POA: Diagnosis not present

## 2017-02-21 DIAGNOSIS — Z560 Unemployment, unspecified: Secondary | ICD-10-CM

## 2017-02-21 DIAGNOSIS — Z87891 Personal history of nicotine dependence: Secondary | ICD-10-CM | POA: Diagnosis not present

## 2017-02-21 DIAGNOSIS — F251 Schizoaffective disorder, depressive type: Secondary | ICD-10-CM

## 2017-02-21 DIAGNOSIS — Z0000 Encounter for general adult medical examination without abnormal findings: Secondary | ICD-10-CM

## 2017-02-21 DIAGNOSIS — F259 Schizoaffective disorder, unspecified: Secondary | ICD-10-CM

## 2017-02-21 DIAGNOSIS — Z114 Encounter for screening for human immunodeficiency virus [HIV]: Secondary | ICD-10-CM | POA: Diagnosis present

## 2017-02-21 NOTE — Progress Notes (Signed)
Case discussed with Dr. Saraiya at the time of the visit. We reviewed the resident's history and exam and pertinent patient test results. I agree with the assessment, diagnosis, and plan of care documented in the resident's note. 

## 2017-02-21 NOTE — Assessment & Plan Note (Addendum)
Patient used to go to [facility] where he received the seroquel and depakote, and he wanted refill of those meds. He has not taken them in 1 and half years. He was diagnosed with schizoaffective 15 years ago.  I explained that we are not able to refill those meds since he has not been taking them and this needs to be managed by psychiatry.  His PHQ-9 is 8 indicating mild depression but will hold off on any treatment given his concurrent schizoaffective and bipolar disorder- defer to psychiatry  Plan -Referred for psychiatry and CSW to help with psychiatry referral.

## 2017-02-21 NOTE — Patient Instructions (Signed)
Thank you for your visit today  Please follow up with a psychiatrist   I will call you with the lab results   Please follow up in 6 months

## 2017-02-21 NOTE — Assessment & Plan Note (Addendum)
Patient is here for a follow up for 'check up'  He is currently unemployed. He lives with a male significant other and is sexually active and does not use contraception. He has 4 kids. He quit smoking last year. He currently drinks about 1 beer per day, and maximum is 2 beers per day. He is physically active and goes to gym. His PHQ-9 is 8 indicating mild depression. He would like to be screened for HIV.   -Plan -Lipid panel as he has one risk factor needing screening -HIV screening  Addendum: 4/10- I called the patient at 10:50 AM and informed him of results Lipid panel shows some triglyceride elevation- encouraged him lifestyle modification including eating healthier.  Explained his HIV screen was negative. He had no further questions.

## 2017-02-21 NOTE — Progress Notes (Signed)
    CC: health maintenance and screening, schizoaffective HPI: Shawn Leach is a 36 y.o. man with PMH noted below here for health maintenance and screening, schizoaffective, alcohol use   Please see Problem List/A&P for the status of the patient's chronic medical problems   Past Medical History:  Diagnosis Date  . Bipolar 1 disorder (HCC)   . Bradycardia    asymptomatic, normal EKG and BMET  . Gonococcal urethritis    hx in 5/03  . High risk homosexual behavior    Hx of relationship with man 2002-2005, no known disease in the partnet  . History of psychiatric disorder    unknown prior diagnosis  . Schizophrenia (HCC)   . Tympanic membrane rupture    history of    Review of Systems: Denies fevers, chills, weight loss Denies headaches, cough or SOB Denies n,v,abd pain, diarrhea Denies rash  Physical Exam: Vitals:   02/21/17 1509  BP: 118/66  Pulse: 76  Temp: 98.2 F (36.8 C)  TempSrc: Oral  SpO2: 100%  Weight: 136 lb 6.4 oz (61.9 kg)  Height:  (1.651 m)    General: A&O, in NAD HEENT: PERRLA Neck: supple, midline trachea, no cervical lymphadenopathy  CV: RRR, normal s1, s2, no m/r/g Resp: equal and symmetric breath sounds, no wheezing or crackles  Abdomen: soft, nontender, nondistended, +BS   Assessment & Plan:   See encounters tab for problem based medical decision making. Patient discussed with Dr. Josem Kaufmann

## 2017-02-22 LAB — LIPID PANEL
Chol/HDL Ratio: 2.8 ratio (ref 0.0–5.0)
Cholesterol, Total: 199 mg/dL (ref 100–199)
HDL: 72 mg/dL (ref >39)
LDL Calculated: 78 mg/dL (ref 0–99)
Triglycerides: 247 mg/dL — ABNORMAL HIGH (ref 0–149)
VLDL Cholesterol Cal: 49 mg/dL — ABNORMAL HIGH (ref 5–40)

## 2017-02-22 LAB — HIV ANTIBODY (ROUTINE TESTING W REFLEX): HIV SCREEN 4TH GENERATION: NONREACTIVE

## 2017-04-14 ENCOUNTER — Ambulatory Visit (HOSPITAL_COMMUNITY): Payer: Self-pay | Admitting: Psychiatry

## 2017-05-07 ENCOUNTER — Emergency Department (HOSPITAL_COMMUNITY): Payer: Medicaid Other

## 2017-05-07 ENCOUNTER — Encounter (HOSPITAL_COMMUNITY): Payer: Self-pay | Admitting: Emergency Medicine

## 2017-05-07 ENCOUNTER — Emergency Department (HOSPITAL_COMMUNITY)
Admission: EM | Admit: 2017-05-07 | Discharge: 2017-05-07 | Disposition: A | Discharge status: Home / Self Care | Payer: Medicaid Other | Attending: Emergency Medicine | Admitting: Emergency Medicine

## 2017-05-07 DIAGNOSIS — M545 Low back pain, unspecified: Secondary | ICD-10-CM

## 2017-05-07 DIAGNOSIS — F17200 Nicotine dependence, unspecified, uncomplicated: Secondary | ICD-10-CM | POA: Insufficient documentation

## 2017-05-07 DIAGNOSIS — M62830 Muscle spasm of back: Secondary | ICD-10-CM | POA: Diagnosis not present

## 2017-05-07 MED ORDER — OXYCODONE-ACETAMINOPHEN 5-325 MG PO TABS
2.0000 | ORAL_TABLET | Freq: Once | ORAL | Status: AC
  Administered 2017-05-07: 2 via ORAL
  Filled 2017-05-07: qty 2

## 2017-05-07 MED ORDER — CYCLOBENZAPRINE HCL 10 MG PO TABS: 10.0000 mg | ORAL_TABLET | Freq: Three times a day (TID) | ORAL | 0 refills | Status: DC | PRN

## 2017-05-07 MED ORDER — NAPROXEN 500 MG PO TABS: 500.0000 mg | ORAL_TABLET | Freq: Two times a day (BID) | ORAL | 0 refills | Status: AC

## 2017-05-07 MED ORDER — KETOROLAC TROMETHAMINE 60 MG/2ML IM SOLN
60.0000 mg | Freq: Once | INTRAMUSCULAR | Status: AC
  Administered 2017-05-07: 60 mg via INTRAMUSCULAR
  Filled 2017-05-07: qty 2

## 2017-05-07 MED ORDER — OXYCODONE-ACETAMINOPHEN 5-325 MG PO TABS: 1.0000 | ORAL_TABLET | ORAL | 0 refills | Status: DC | PRN

## 2017-05-07 MED ORDER — METHYLPREDNISOLONE 4 MG PO TBPK: ORAL_TABLET | ORAL | 0 refills | Status: DC

## 2017-05-07 MED ORDER — DIAZEPAM 5 MG PO TABS
5.0000 mg | ORAL_TABLET | Freq: Once | ORAL | Status: AC
  Administered 2017-05-07: 5 mg via ORAL
  Filled 2017-05-07: qty 1

## 2017-05-07 MED ORDER — HYDROMORPHONE HCL 1 MG/ML IJ SOLN
1.0000 mg | Freq: Once | INTRAMUSCULAR | Status: AC
  Administered 2017-05-07: 1 mg via INTRAMUSCULAR
  Filled 2017-05-07: qty 1

## 2017-05-07 MED ORDER — PREDNISONE 20 MG PO TABS
60.0000 mg | ORAL_TABLET | Freq: Once | ORAL | Status: AC
  Administered 2017-05-07: 60 mg via ORAL
  Filled 2017-05-07: qty 3

## 2017-05-07 NOTE — ED Notes (Addendum)
Pt had difficulty getting standing position without assist. Ambulated in hall with steady gait.

## 2017-05-07 NOTE — ED Provider Notes (Signed)
[facility]-EMERGENCY DEPT Provider Note   CSN: [id number] Arrival date & time: 05/07/17  1521     History   Chief Complaint Chief Complaint  Patient presents with  . Back Pain    HPI Shawn Leach is a 36 y.o. male.  HPI   36 yo M with h/o schizophrenia here with low back pain. Pt states that he was lifting a cooler today when he experienced acute onset of sharp, stabbing, cramping lower back pain. The pain was located in his lower lumbar spine and shoots up his back and down his legs any time he moves. He has had difficulty walking due to the pain. No numbness or weakness. No loss of bowel or bladder function. He has had mild, chronic low back pain but nothing this severe. Denies fevers, night sweats, or weight loss.  Past Medical History:  Diagnosis Date  . Bipolar 1 disorder (HCC)   . Bradycardia    asymptomatic, normal EKG and BMET  . Gonococcal urethritis    hx in 5/03  . High risk homosexual behavior    Hx of relationship with man 2002-2005, no known disease in the partnet  . History of psychiatric disorder    unknown prior diagnosis  . Schizophrenia (HCC)   . Tympanic membrane rupture    history of    Patient Active Problem List   Diagnosis Date Noted  . Encounter for immunization 07/08/2016  . Adjustment disorder with emotional disturbance 06/28/2014  . Health care maintenance 01/03/2013  . Alcohol use 01/03/2013  . Schizoaffective disorder, unspecified type (HCC) 03/16/2007  . Bipolar disorder (HCC) 03/16/2007    History reviewed. No pertinent surgical history.     Home Medications    Prior to Admission medications   Medication Sig Start Date End Date Taking? Authorizing Provider  cyclobenzaprine (FLEXERIL) 10 MG tablet Take 1 tablet (10 mg total) by mouth 3 (three) times daily as needed for muscle spasms. 05/07/17   Shaune Pollack, MD  divalproex (DEPAKOTE ER) 500 MG 24 hr tablet Take 1 tablet (500 mg total) by mouth 2 (two) times daily. Patient  not taking: Reported on 02/21/2017 04/17/14   Dow Adolph, MD  methylPREDNISolone (MEDROL DOSEPAK) 4 MG TBPK tablet Take as directed 05/07/17   Shaune Pollack, MD  naproxen (NAPROSYN) 500 MG tablet Take 1 tablet (500 mg total) by mouth 2 (two) times daily. 05/07/17 05/17/17  Shaune Pollack, MD  oxyCODONE-acetaminophen (PERCOCET/ROXICET) 5-325 MG tablet Take 1-2 tablets by mouth every 4 (four) hours as needed for severe pain. 05/07/17   Shaune Pollack, MD  QUEtiapine (SEROQUEL) 300 MG tablet Take 1 tablet (300 mg total) by mouth at bedtime. Patient not taking: Reported on 02/21/2017 04/17/14   Dow Adolph, MD    Family History History reviewed. No pertinent family history.  Social History Social History  Substance Use Topics  . Smoking status: Former Smoker    Types: Cigars    Quit date: 01/14/2011  . Smokeless tobacco: Never Used     Comment: Black and Milds  . Alcohol use 12.6 oz/week    21 Cans of beer per week     Allergies   Patient has no known allergies.   Review of Systems Review of Systems  Musculoskeletal: Positive for arthralgias, back pain and gait problem.  All other systems reviewed and are negative.    Physical Exam Updated Vital Signs BP 124/84   Pulse 63   Temp 97.9 F (36.6 C) (Oral)   Resp 18  Ht 5\' 9"  (1.753 m)   Wt 59 kg (130 lb)   SpO2 96%   BMI 19.20 kg/m   Physical Exam  Constitutional: He is oriented to person, place, and time. He appears well-developed and well-nourished. He appears distressed.  Tearful, in pain  HENT:  Head: Normocephalic and atraumatic.  Eyes: Conjunctivae are normal.  Neck: Neck supple.  Cardiovascular: Normal rate, regular rhythm and normal heart sounds.   Pulmonary/Chest: Effort normal. No respiratory distress. He has no wheezes.  Abdominal: He exhibits no distension.  Musculoskeletal: He exhibits no edema.  Neurological: He is alert and oriented to person, place, and time. He exhibits normal muscle tone.  Skin:  Skin is warm. Capillary refill takes less than 2 seconds. No rash noted.  Nursing note and vitals reviewed.   Spine Exam: Inspection/Palpation: Exquisite TTP along lower lumbar paraspinal muscles with palpable spasm.  Strength: 5/5 throughout LE bilaterally (hip flexion/extension, adduction/abduction; knee flexion/extension; foot dorsiflexion/plantarflexion, inversion/eversion; great toe inversion) Sensation: Intact to light touch in proximal and distal LE bilaterally Reflexes: 2+ quadriceps and achilles reflexes Rectal: Intact perianal sensation to light touch with no saddle anesthesia   ED Treatments / Results  Labs (all labs ordered are listed, but only abnormal results are displayed) Labs Reviewed - No data to display  EKG  EKG Interpretation None       Radiology Dg Lumbar Spine Complete  Result Date: 05/07/2017 CLINICAL DATA:  Low back pain. EXAM: LUMBAR SPINE - COMPLETE 4+ VIEW COMPARISON:  None. FINDINGS: There is no evidence of lumbar spine fracture. Alignment is normal. Intervertebral disc spaces are maintained. IMPRESSION: Negative. Electronically Signed   By: Gerome Sam III M.D   On: 05/07/2017 18:22    Procedures Procedures (including critical care time)  Medications Ordered in ED Medications  HYDROmorphone (DILAUDID) injection 1 mg (1 mg Intramuscular Given 05/07/17 1645)  diazepam (VALIUM) tablet 5 mg (5 mg Oral Given 05/07/17 1645)  ketorolac (TORADOL) injection 60 mg (60 mg Intramuscular Given 05/07/17 1645)  oxyCODONE-acetaminophen (PERCOCET/ROXICET) 5-325 MG per tablet 2 tablet (2 tablets Oral Given 05/07/17 1918)  predniSONE (DELTASONE) tablet 60 mg (60 mg Oral Given 05/07/17 1918)     Initial Impression / Assessment and Plan / ED Course  I have reviewed the triage vital signs and the nursing notes.  Pertinent labs & imaging results that were available during my care of the patient were reviewed by me and considered in my medical decision making (see  chart for details).     36 yo M here with severe lower back pain after lifting a heavy container today. Suspect lower paraspinal sprain and spasm. There may also be a component of chronic radiculopathy but he is fully neurologically intact, with normal reflexes, no loss of bowel/bladder function, no perianal anesthesia, and no signs of cauda equina. He was well prior to the episode with no sx to suggest osteo or infectious process. Plain films neg. He is ambulatory after analgesia in ED. Will treat with analgesia, muscle relaxants, outpt follow-up.  Final Clinical Impressions(s) / ED Diagnoses   Final diagnoses:  Acute bilateral low back pain without sciatica  Paraspinal muscle spasm    New Prescriptions Discharge Medication List as of 05/07/2017  7:40 PM    START taking these medications   Details  cyclobenzaprine (FLEXERIL) 10 MG tablet Take 1 tablet (10 mg total) by mouth 3 (three) times daily as needed for muscle spasms., Starting Sat 05/07/2017, Print    methylPREDNISolone (MEDROL DOSEPAK) 4 MG  TBPK tablet Take as directed, Print    naproxen (NAPROSYN) 500 MG tablet Take 1 tablet (500 mg total) by mouth 2 (two) times daily., Starting Sat 05/07/2017, Until Tue 05/17/2017, Print    oxyCODONE-acetaminophen (PERCOCET/ROXICET) 5-325 MG tablet Take 1-2 tablets by mouth every 4 (four) hours as needed for severe pain., Starting Sat 05/07/2017, Print         Shaune PollackIsaacs, Maythe Deramo, MD 05/07/17 2340

## 2017-05-07 NOTE — ED Notes (Signed)
Patient transported to X-ray 

## 2017-05-07 NOTE — ED Notes (Signed)
Pt stable, understands discharge instructions, and reasons for return.   

## 2017-05-07 NOTE — ED Triage Notes (Signed)
Pt to ER for evaluation of lower back pain radiating down both legs after "throwing it out" when he bent over with a heavy cooler. Pt reports hx of same. Pt unable to sit in wheelchair due to pain. A/o x4.

## 2017-05-17 ENCOUNTER — Ambulatory Visit (INDEPENDENT_AMBULATORY_CARE_PROVIDER_SITE_OTHER): Payer: Medicaid Other | Admitting: Psychiatry

## 2017-05-17 ENCOUNTER — Encounter (HOSPITAL_COMMUNITY): Payer: Self-pay | Admitting: Psychiatry

## 2017-05-17 VITALS — BP 108/74 | HR 78 | Ht 67.5 in | Wt 136.0 lb

## 2017-05-17 DIAGNOSIS — G4700 Insomnia, unspecified: Secondary | ICD-10-CM | POA: Diagnosis not present

## 2017-05-17 DIAGNOSIS — F5104 Psychophysiologic insomnia: Secondary | ICD-10-CM

## 2017-05-17 DIAGNOSIS — Z87891 Personal history of nicotine dependence: Secondary | ICD-10-CM

## 2017-05-17 DIAGNOSIS — F250 Schizoaffective disorder, bipolar type: Secondary | ICD-10-CM | POA: Diagnosis not present

## 2017-05-17 DIAGNOSIS — F1290 Cannabis use, unspecified, uncomplicated: Secondary | ICD-10-CM

## 2017-05-17 MED ORDER — TRAZODONE HCL 100 MG PO TABS: 100.0000 mg | ORAL_TABLET | Freq: Every day | ORAL | 2 refills | Status: DC

## 2017-05-17 MED ORDER — DIVALPROEX SODIUM ER 500 MG PO TB24: 1000.0000 mg | ORAL_TABLET | Freq: Every day | ORAL | 2 refills | Status: DC

## 2017-05-17 NOTE — Patient Instructions (Signed)
Take depakote 2 tablets in the morning  Take trazodone at bedtime for sleep
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
## 2017-05-24 ENCOUNTER — Ambulatory Visit (INDEPENDENT_AMBULATORY_CARE_PROVIDER_SITE_OTHER): Payer: Medicaid Other | Admitting: Licensed Clinical Social Worker

## 2017-05-24 ENCOUNTER — Encounter (HOSPITAL_COMMUNITY): Payer: Self-pay | Admitting: Licensed Clinical Social Worker

## 2017-05-24 DIAGNOSIS — F5104 Psychophysiologic insomnia: Secondary | ICD-10-CM | POA: Diagnosis not present

## 2017-05-24 DIAGNOSIS — F250 Schizoaffective disorder, bipolar type: Secondary | ICD-10-CM | POA: Diagnosis not present

## 2017-05-24 NOTE — Progress Notes (Signed)
Comprehensive Clinical Assessment (CCA) Note  05/24/2017 Shawn Leach [id number]  Visit Diagnosis:      ICD-10-CM   1. Schizoaffective disorder, bipolar type (HCC) F25.0   2. Psychophysiological insomnia F51.04       CCA Part One  Part One has been completed on paper by the patient.  (See scanned document in Chart Review)  CCA Part Two A  Intake/Chief Complaint:  CCA Intake With Chief Complaint CCA Part Two Date: 05/24/17 CCA Part Two Time: 1100 Chief Complaint/Presenting Problem: "I get angry when people tell me I don't know what I'm talking about" Patients Currently Reported Symptoms/Problems: "cussing people out,"  Collateral Involvement: gf Williemae Individual's Strengths: "I draw sometimes, I'm handy, I"m a good dad" Type of Services Patient Feels Are Needed: individual counseling  Mental Health Symptoms Depression:  Depression: Hopelessness, Irritability  Mania:     Anxiety:      Psychosis:     Trauma:     Obsessions:     Compulsions:     Inattention:     Hyperactivity/Impulsivity:     Oppositional/Defiant Behaviors:     Borderline Personality:     Other Mood/Personality Symptoms:      Mental Status Exam Appearance and self-care  Stature:  Stature: Average  Weight:  Weight: Thin  Clothing:  Clothing: Casual  Grooming:  Grooming: Normal  Cosmetic use:  Cosmetic Use: None  Posture/gait:  Posture/Gait: Slumped  Motor activity:  Motor Activity: Slowed  Sensorium  Attention:  Attention: Normal  Concentration:  Concentration: Normal  Orientation:  Orientation: X5  Recall/memory:  Recall/Memory: Normal  Affect and Mood  Affect:  Affect: Blunted, Flat  Mood:  Mood: Irritable  Relating  Eye contact:  Eye Contact: Avoided  Facial expression:  Facial Expression: Constricted  Attitude toward examiner:  Attitude Toward Examiner: Guarded, Uninterested  Thought and Language  Speech flow: Speech Flow: Paucity  Thought content:  Thought Content: Appropriate  to mood and circumstances  Preoccupation:     Hallucinations:     Organization:     Company secretary of Knowledge:  Fund of Knowledge: Average  Intelligence:  Intelligence: Below average  Abstraction:  Abstraction: Development worker, international aid:  Judgement: Normal  Reality Testing:     Insight:     Decision Making:     Social Functioning  Social Maturity:  Social Maturity: Isolates  Social Judgement:  Social Judgement: Normal  Stress  Stressors:  Stressors: Family conflict, Money  Coping Ability:  Coping Ability: Deficient supports  Skill Deficits:     Supports:      Family and Psychosocial History: Family history Marital status: Long term relationship Long term relationship, how long?: 12 years What types of issues is patient dealing with in the relationship?: "I get angry at her sometimes, sometimes there's silence" Are you sexually active?: Yes What is your sexual orientation?: heterosexual Does patient have children?: Yes How many children?: 4 How is patient's relationship with their children?: Good, I cook for them  Childhood History:  Childhood History By whom was/is the patient raised?: Mother Additional childhood history information: "no relationship w/ dad till 2008" Description of patient's relationship with caregiver when they were a child: good, "I didn't get in trouble a lot" Patient's description of current relationship with people who raised him/her: it's good How were you disciplined when you got in trouble as a child/adolescent?: "I don't remember" Does patient have siblings?: Yes Number of Siblings: 2 Description of patient's current relationship with siblings: I like  my older brother, I rarely see my younger brother Did patient suffer any verbal/emotional/physical/sexual abuse as a child?: No Did patient suffer from severe childhood neglect?: No Has patient ever been sexually abused/assaulted/raped as an adolescent or adult?: No Was the patient ever a  victim of a crime or a disaster?: No Witnessed domestic violence?: No Has patient been effected by domestic violence as an adult?: No  CCA Part Two B  Employment/Work Situation: Employment / Work Situation Employment situation: On disability Why is patient on disability: Bipolar  How long has patient been on disability: Since I was a child Patient's job has been impacted by current illness: No What is the longest time patient has a held a job?: 2011-2013 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 Has patient ever been in the Eli Lilly and Companymilitary?: No  Education: Education Last Grade Completed: 12 Name of High School: [employer] Did Garment/textile technologistYou Graduate From [employer] School?: No Did You Product managerAttend College?: No Did Designer, television/film setYou Attend Graduate School?: No Did You Have Any Special Interests In School?: Art Did You Have An Individualized Education Program (IIEP): No Did You Have Any Difficulty At [employer]?: No  Religion: Religion/Spirituality Are You A Religious Person?: Yes What is Your Religious Affiliation?: None  Leisure/Recreation: Leisure / Recreation Leisure and Hobbies: watch tv, fix things around the house  Exercise/Diet: Exercise/Diet Do You Exercise?: Yes What Type of Exercise Do You Do?: Weight Training How Many Times a Week Do You Exercise?: 1-3 times a week Have You Gained or Lost A Significant Amount of Weight in the Past Six Months?: No Do You Follow a Special Diet?: No Do You Have Any Trouble Sleeping?: No  CCA Part Two C  Alcohol/Drug Use: Alcohol / Drug Use Prescriptions: Depakote Over the Counter: none History of alcohol / drug use?: Yes Longest period of sobriety (when/how long): 18 months when I was on probation Substance #1 Name of Substance 1: Beer 1 - Age of First Use: 20 1 - Amount (size/oz): 2-3 beers 1 - Frequency: 1-3 times week Substance #2 Name of Substance 2: Marijuana 2 - Age of First Use: 20 2 - Amount (size/oz): 1 gr 2 - Frequency: near daily                   CCA Part Three  ASAM's:  Six Dimensions of Multidimensional Assessment  Dimension 1:  Acute Intoxication and/or Withdrawal Potential:     Dimension 2:  Biomedical Conditions and Complications:     Dimension 3:  Emotional, Behavioral, or Cognitive Conditions and Complications:     Dimension 4:  Readiness to Change:     Dimension 5:  Relapse, Continued use, or Continued Problem Potential:     Dimension 6:  Recovery/Living Environment:      Substance use Disorder (SUD)    Social Function:  Social Functioning Social Maturity: Isolates Social Judgement: Normal  Stress:  Stress Stressors: Family conflict, Money Coping Ability: Deficient supports Patient Takes Medications The Way The Doctor Instructed?: Yes Priority Risk: Low Acuity  Risk Assessment- Self-Harm Potential: Risk Assessment For Self-Harm Potential Thoughts of Self-Harm: No current thoughts Method: No plan Availability of Means: No access/NA Additional Comments for Self-Harm Potential: I've had thoughts of wanting to die  Risk Assessment -Dangerous to Others Potential: Risk Assessment For Dangerous to Others Potential Method: No Plan Availability of Means: No access or NA Intent: Vague intent or NA Notification Required: No need or identified person Additional Comments for Danger to Others Potential: I've threaten to  burn the house down when I'm angry, but I haven't struggled with that in over 6 mo  DSM5 Diagnoses: Patient Active Problem List   Diagnosis Date Noted  . Encounter for immunization 07/08/2016  . Adjustment disorder with emotional disturbance 06/28/2014  . Health care maintenance 01/03/2013  . Alcohol use 01/03/2013  . Schizoaffective disorder, unspecified type (HCC) 03/16/2007  . Schizoaffective disorder, bipolar type (HCC) 03/16/2007    Patient Centered Plan: Patient is on the following Treatment Plan(s):  Impulse Control  Recommendations for Services/Supports/Treatments: Recommendations  for Services/Supports/Treatments Recommendations For Services/Supports/Treatments: Individual Therapy, Medication Management  Treatment Plan Summary:  Patient will work on a bimonthly basis to increase his coping ability to handle stress, fear, anger. Pt will take all medications as px by Dr. Rene Kocher. Pt will gain insight into his underlying frustrations and increase his internal locus of control.  Referrals to Alternative Service(s): Referred to Alternative Service(s):   Place:   Date:   Time:    Referred to Alternative Service(s):   Place:   Date:   Time:    Referred to Alternative Service(s):   Place:   Date:   Time:    Referred to Alternative Service(s):   Place:   Date:   Time:     Margo Common

## 2017-06-01 ENCOUNTER — Telehealth: Payer: Self-pay

## 2017-06-01 NOTE — Telephone Encounter (Signed)
Questions about med. Please call pt back.  

## 2017-06-01 NOTE — Telephone Encounter (Signed)
Made appt for fri 7/20 at 1545

## 2017-06-03 ENCOUNTER — Encounter: Payer: Self-pay | Admitting: Internal Medicine

## 2017-06-03 ENCOUNTER — Ambulatory Visit (INDEPENDENT_AMBULATORY_CARE_PROVIDER_SITE_OTHER): Payer: Medicaid Other | Admitting: Internal Medicine

## 2017-06-03 VITALS — BP 132/72 | HR 78 | Temp 98.0°F | Wt 132.4 lb

## 2017-06-03 DIAGNOSIS — Z79899 Other long term (current) drug therapy: Secondary | ICD-10-CM | POA: Diagnosis not present

## 2017-06-03 DIAGNOSIS — M545 Low back pain: Secondary | ICD-10-CM | POA: Diagnosis present

## 2017-06-03 DIAGNOSIS — Z87891 Personal history of nicotine dependence: Secondary | ICD-10-CM | POA: Diagnosis not present

## 2017-06-03 MED ORDER — NAPROXEN 500 MG PO TABS: 500.0000 mg | ORAL_TABLET | Freq: Two times a day (BID) | ORAL | 0 refills | Status: DC

## 2017-06-03 NOTE — Patient Instructions (Signed)
Mr. Shawn Leach,  It was a pleasure meeting you today. I would recommend taking Flexeril 1-2 times a day for muscle spasms as needed. You can continue taking naproxen for the next 2 weeks to help with your back pain.  Please  return to the clinic if your symptoms worsen.

## 2017-06-03 NOTE — Progress Notes (Signed)
   CC: Back pain  HPI:  [name] is a 36 y.o. male with history noted below that presents to the acute care clinic for follow up of his back pain.  Patient states that at the end of June he had strained his back.  He visited the ED at the time and received flexeril and naproxen.  He states he hasn't taken the flexeril yet because he was not sure if there was an interaction with his trazodone or Depakote. He has taken the naproxen with benefit to his symptoms.  Today he states that his symptoms of back pain have improved significantly, although still has some pain and tightness to his lower back.  He denies weakness or numbness.  Past Medical History:  Diagnosis Date  . Bipolar 1 disorder (HCC)   . Bradycardia    asymptomatic, normal EKG and BMET  . Gonococcal urethritis    hx in 5/03  . High risk homosexual behavior    Hx of relationship with man 2002-2005, no known disease in the partnet  . History of psychiatric disorder    unknown prior diagnosis  . Schizophrenia (HCC)   . Tympanic membrane rupture    history of    Review of Systems:  Review of Systems  Cardiovascular: Negative for leg swelling.  Musculoskeletal: Positive for back pain. Negative for falls and neck pain.     Physical Exam:  Vitals:   06/03/17 1556  BP: 132/72  Pulse: 78  Temp: 98 F (36.7 C)  TempSrc: Oral  SpO2: 99%  Weight: 132 lb 6.4 oz (60.1 kg)   Physical Exam  Constitutional: He is well-developed, well-nourished, and in no distress.  Cardiovascular: Normal rate, regular rhythm and normal heart sounds.  Exam reveals no gallop and no friction rub.   No murmur heard. Pulmonary/Chest: Effort normal and breath sounds normal. He has no wheezes. He has no rales.  Musculoskeletal: He exhibits no edema.  Paraspinal musculature tenderness to lumbar spine  Neurological: Gait normal.  5/5 motor strength in lower extremities bilaterally   Skin: Skin is warm and dry. No rash noted. No  erythema.    Assessment & Plan:   See encounters tab for problem based medical decision making.   Patient discussed with Dr. Criselda PeachesMullen

## 2017-06-05 DIAGNOSIS — M549 Dorsalgia, unspecified: Secondary | ICD-10-CM | POA: Insufficient documentation

## 2017-06-05 DIAGNOSIS — M545 Low back pain: Secondary | ICD-10-CM

## 2017-06-05 NOTE — Assessment & Plan Note (Signed)
Assessment: acute back pain Patient was seen in the ED on 6/23 for acute back strain and was prescribed naproxen and flexeril.  Patient states the naproxen had helped his symptoms.  He hadn't taken flexeril due to uncertainty of interaction with his current medications of trazodone and Depakote. Upon review of medication interactions no suggestion of concerning interaction between all 3 medications was noted.  I recommended taking one tablet of flexeril at night as needed and to continue another short course of naproxen as needed for his back pain.  Plan -flexeril -naproxen refill

## 2017-06-06 NOTE — Progress Notes (Signed)
Internal Medicine Clinic Attending  Case discussed with Dr. Hoffman at the time of the visit.  We reviewed the resident's history and exam and pertinent patient test results.  I agree with the assessment, diagnosis, and plan of care documented in the resident's note.  

## 2017-06-07 ENCOUNTER — Encounter (HOSPITAL_COMMUNITY): Payer: Self-pay | Admitting: Licensed Clinical Social Worker

## 2017-06-07 ENCOUNTER — Ambulatory Visit (INDEPENDENT_AMBULATORY_CARE_PROVIDER_SITE_OTHER): Payer: Medicaid Other | Admitting: Licensed Clinical Social Worker

## 2017-06-07 DIAGNOSIS — F250 Schizoaffective disorder, bipolar type: Secondary | ICD-10-CM

## 2017-06-07 NOTE — Progress Notes (Signed)
   THERAPIST PROGRESS NOTE  Session Time: 10-10:45am  Participation Level: Active  Behavioral Response: DisheveledAlertEuthymic  Type of Therapy: Individual Therapy  Treatment Goals addressed: Coping  Interventions: CBT and Other: DMV Test Taking skills  Summary: Shawn Leach is a 36 y.o. male who presents seeking help managing his mood, stress, and anger related to his schizoaffective and bipolar dx. He states he has had a good week. he attended a HS reunion and helped cook for "a few hundred people". His mother calls him in session and he answers it for 1 min. He gets agitated upon hanging up saying his mother is always bothering him about getting car rides. Pt admits he does not have a driver's license bc he cannot pass the test. Counselor and pt explore online resources [facility]. He responds well and says he wants to learn more next session.   Suicidal/Homicidal: Nowithout intent/plan  Therapist Response: Counselor used open questions to allow pt to determine the direction of their session. Pt struggled to steer the session in any significant way. Thus the counselor helped direct him to the goal of studying for his driver's test to acquire a license which will hopefully counteract his current stress level, having to rely on his wife for legal transportation.   Plan: Return again in 2 weeks.  Diagnosis:    ICD-10-CM   1. Schizoaffective disorder, bipolar type [facility](HCC) F25.0        Margo CommonWesley E Swan, LPCA 06/07/2017

## 2017-06-17 ENCOUNTER — Ambulatory Visit (HOSPITAL_COMMUNITY): Payer: Medicaid Other | Admitting: Psychiatry

## 2017-06-18 ENCOUNTER — Encounter (HOSPITAL_COMMUNITY): Payer: Self-pay | Admitting: *Deleted

## 2017-06-18 ENCOUNTER — Emergency Department (HOSPITAL_COMMUNITY)
Admission: EM | Admit: 2017-06-18 | Discharge: 2017-06-18 | Disposition: A | Discharge status: Home / Self Care | Payer: Medicaid Other | Attending: Emergency Medicine | Admitting: Emergency Medicine

## 2017-06-18 ENCOUNTER — Emergency Department (HOSPITAL_COMMUNITY): Payer: Medicaid Other

## 2017-06-18 DIAGNOSIS — Z87891 Personal history of nicotine dependence: Secondary | ICD-10-CM | POA: Diagnosis not present

## 2017-06-18 DIAGNOSIS — M62830 Muscle spasm of back: Secondary | ICD-10-CM | POA: Insufficient documentation

## 2017-06-18 DIAGNOSIS — R52 Pain, unspecified: Secondary | ICD-10-CM

## 2017-06-18 DIAGNOSIS — M545 Low back pain: Secondary | ICD-10-CM | POA: Diagnosis present

## 2017-06-18 MED ORDER — PREDNISONE 10 MG PO TABS: 20.0000 mg | ORAL_TABLET | Freq: Two times a day (BID) | ORAL | 0 refills | Status: DC

## 2017-06-18 MED ORDER — KETOROLAC TROMETHAMINE 60 MG/2ML IM SOLN
30.0000 mg | Freq: Once | INTRAMUSCULAR | Status: AC
  Administered 2017-06-18: 30 mg via INTRAMUSCULAR
  Filled 2017-06-18: qty 2

## 2017-06-18 MED ORDER — OXYCODONE-ACETAMINOPHEN 5-325 MG PO TABS
1.0000 | ORAL_TABLET | Freq: Once | ORAL | Status: AC
  Administered 2017-06-18: 1 via ORAL
  Filled 2017-06-18: qty 1

## 2017-06-18 MED ORDER — DIAZEPAM 5 MG PO TABS
5.0000 mg | ORAL_TABLET | Freq: Once | ORAL | Status: AC
  Administered 2017-06-18: 5 mg via ORAL
  Filled 2017-06-18: qty 1

## 2017-06-18 MED ORDER — DIAZEPAM 5 MG PO TABS
5.0000 mg | ORAL_TABLET | Freq: Two times a day (BID) | ORAL | 0 refills | Status: DC
Start: 2017-06-18 — End: 2017-08-29

## 2017-06-18 NOTE — ED Triage Notes (Signed)
Pt reports being seen in past for pulled muscle to his back, was told no heavy lifting. Was carrying a platter of chicken and had tightening pain to his mid back that radiated all the way around his body. Pt appears anxious at triage, requesting full body xrays. ambulatory on arrival.

## 2017-06-18 NOTE — ED Provider Notes (Signed)
[facility]-EMERGENCY DEPT Provider Note   CSN: [id number] Arrival date & time: 06/18/17  1707     History   Chief Complaint Chief Complaint  Patient presents with  . Back Pain    HPI Shawn Leach is a 36 y.o. male who presents to the ED with back pain. The patient reports that the pain started suddenly when he was lifting a pain of chicken but it wasn't really heavy. The pain continued to get tighter and tighter on the pain started in the lower back and came around to the ribs on both sides. Patient denies loss of control of bladder or bowels. He has not taken anything for pain. The pain started about 5 hours prior to arrival to the ED.  Patient states that the pain at this time is worse in the anterior rib area bilateral. Patient thinks his ribs may be injured.   The history is provided by the patient. No language interpreter was used.  Back Pain   This is a new problem. The current episode started 3 to 5 hours ago. The problem occurs constantly. The problem has been gradually worsening. The pain is associated with no known injury. The pain is present in the lumbar spine. The quality of the pain is described as aching and shooting. Radiates to: ribs. The pain is at a severity of 10/10. The symptoms are aggravated by bending and twisting. Pertinent negatives include no chest pain, no fever, no headaches, no abdominal pain and no dysuria. He has tried nothing for the symptoms.    Past Medical History:  Diagnosis Date  . Bipolar 1 disorder (HCC)   . Bradycardia    asymptomatic, normal EKG and BMET  . Gonococcal urethritis    hx in 5/03  . High risk homosexual behavior    Hx of relationship with man 2002-2005, no known disease in the partnet  . History of psychiatric disorder    unknown prior diagnosis  . Schizophrenia (HCC)   . Tympanic membrane rupture    history of    Patient Active Problem List   Diagnosis Date Noted  . Acute low back pain 06/05/2017  . Encounter for  immunization 07/08/2016  . Adjustment disorder with emotional disturbance 06/28/2014  . Health care maintenance 01/03/2013  . Alcohol use 01/03/2013  . Schizoaffective disorder, unspecified type (HCC) 03/16/2007  . Schizoaffective disorder, bipolar type (HCC) 03/16/2007    History reviewed. No pertinent surgical history.     Home Medications    Prior to Admission medications   Medication Sig Start Date End Date Taking? Authorizing Provider  cyclobenzaprine (FLEXERIL) 10 MG tablet Take 1 tablet (10 mg total) by mouth 3 (three) times daily as needed for muscle spasms. 05/07/17   Shaune Pollack, MD  diazepam (VALIUM) 5 MG tablet Take 1 tablet (5 mg total) by mouth 2 (two) times daily. 06/18/17   Janne Napoleon, NP  divalproex (DEPAKOTE ER) 500 MG 24 hr tablet Take 2 tablets (1,000 mg total) by mouth daily. 05/17/17   Burnard Leigh, MD  methylPREDNISolone (MEDROL DOSEPAK) 4 MG TBPK tablet Take as directed 05/07/17   Shaune Pollack, MD  naproxen (NAPROSYN) 500 MG tablet Take 1 tablet (500 mg total) by mouth 2 (two) times daily with a meal. 06/03/17 06/03/18  Hoffman, Bary Richard, DO  oxyCODONE-acetaminophen (PERCOCET/ROXICET) 5-325 MG tablet Take 1-2 tablets by mouth every 4 (four) hours as needed for severe pain. Patient not taking: Reported on 05/24/2017 05/07/17   Shaune Pollack, MD  predniSONE (DELTASONE) 10 MG tablet Take 2 tablets (20 mg total) by mouth 2 (two) times daily with a meal. 06/18/17   Janne NapoleonNeese, Hope M, NP  traZODone (DESYREL) 100 MG tablet Take 1 tablet (100 mg total) by mouth at bedtime. 05/17/17   Eksir, Bo McclintockAlexander Arya, MD    Family History History reviewed. No pertinent family history.  Social History Social History  Substance Use Topics  . Smoking status: Former Smoker    Types: Cigars    Quit date: 01/14/2011  . Smokeless tobacco: Never Used     Comment: Black and Milds  . Alcohol use 0.6 oz/week    1 Cans of beer per week     Allergies   Patient has no known  allergies.   Review of Systems Review of Systems  Constitutional: Negative for chills and fever.  HENT: Negative.   Eyes: Negative for visual disturbance.  Respiratory: Negative for shortness of breath.   Cardiovascular: Negative for chest pain.  Gastrointestinal: Negative for abdominal pain, diarrhea, nausea and vomiting.  Genitourinary: Negative for dysuria, frequency and urgency.  Musculoskeletal: Positive for arthralgias and back pain. Negative for neck pain.       Rib pain bilateral  Neurological: Negative for syncope and headaches.  Psychiatric/Behavioral: Negative for confusion.     Physical Exam Updated Vital Signs BP (!) 121/91 (BP Location: Right Arm)   Pulse 86   Temp 98 F (36.7 C) (Oral)   Resp 18   Wt 55.8 kg (123 lb)   SpO2 100%   BMI 18.98 kg/m   Physical Exam  Constitutional: He appears well-developed and well-nourished. No distress.  HENT:  Head: Normocephalic and atraumatic.  Nose: Nose normal.  Mouth/Throat: Mucous membranes are normal.  Eyes: Conjunctivae and EOM are normal.  Neck: Normal range of motion. Neck supple.  Cardiovascular: Normal rate and regular rhythm.   Pulmonary/Chest: Effort normal. He has no wheezes. He has no rales.  Tender with palpation and movement bilateral anterior rib area.   Abdominal: Soft. Bowel sounds are normal. He exhibits no mass. There is no tenderness.  Musculoskeletal: He exhibits no edema.       Lumbar back: He exhibits tenderness and spasm. He exhibits normal pulse. Decreased range of motion: due to pain.       Back:  Radial and pedal pulses strong, adequate circulation. Ambulatory without foot drag.  Muscle spasm noted bilateral lumbar area.    Neurological: He is alert. He has normal strength. No sensory deficit. He displays a negative Romberg sign. Gait normal.  Reflex Scores:      Bicep reflexes are 2+ on the right side and 2+ on the left side.      Brachioradialis reflexes are 2+ on the right side and  2+ on the left side.      Patellar reflexes are 2+ on the right side and 2+ on the left side. Skin: Skin is warm and dry.  Psychiatric: He has a normal mood and affect. His behavior is normal.     ED Treatments / Results  Labs (all labs ordered are listed, but only abnormal results are displayed) Labs Reviewed - No data to display   Radiology Dg Chest 2 View  Result Date: 06/18/2017 CLINICAL DATA:  36 year old male with chest pain. EXAM: CHEST  2 VIEW COMPARISON:  Chest radiograph dated 04/13/2009 FINDINGS: The heart size and mediastinal contours are within normal limits. Both lungs are clear. The visualized skeletal structures are unremarkable. IMPRESSION: No active cardiopulmonary disease.  Electronically Signed   By: Elgie CollardArash  Radparvar M.D.   On: 06/18/2017 22:04    Procedures Procedures (including critical care time)  Medications Ordered in ED Medications  diazepam (VALIUM) tablet 5 mg (5 mg Oral Given 06/18/17 1906)  ketorolac (TORADOL) injection 30 mg (30 mg Intramuscular Given 06/18/17 1906)  oxyCODONE-acetaminophen (PERCOCET/ROXICET) 5-325 MG per tablet 1 tablet (1 tablet Oral Given 06/18/17 2229)     Initial Impression / Assessment and Plan / ED Course  I have reviewed the triage vital signs and the nursing notes.  Patient with back pain.  No neurological deficits and normal neuro exam.  Patient can walk but states is painful.  No loss of bowel or bladder control.  No concern for cauda equina.  No fever, night sweats, weight loss, h/o cancer, IVDU.  RICE protocol and pain medicine indicated and discussed with patient.   Final Clinical Impressions(s) / ED Diagnoses   Final diagnoses:  Spasm of muscle of lower back    New Prescriptions New Prescriptions   DIAZEPAM (VALIUM) 5 MG TABLET    Take 1 tablet (5 mg total) by mouth 2 (two) times daily.   PREDNISONE (DELTASONE) 10 MG TABLET    Take 2 tablets (20 mg total) by mouth 2 (two) times daily with a meal.     Kerrie Buffaloeese, Hope [place],  NP 06/18/17 2306    Arby BarrettePfeiffer, Marcy, MD 06/18/17 2356

## 2017-06-18 NOTE — ED Notes (Signed)
Patient transported to X-ray 

## 2017-06-18 NOTE — Discharge Instructions (Signed)
Follow up with your doctor or with the orthopedic doctor if symptoms do not improve. Return here for worsening symptoms.

## 2017-06-21 ENCOUNTER — Encounter (HOSPITAL_COMMUNITY): Payer: Self-pay | Admitting: Licensed Clinical Social Worker

## 2017-06-21 ENCOUNTER — Ambulatory Visit (INDEPENDENT_AMBULATORY_CARE_PROVIDER_SITE_OTHER): Payer: Medicaid Other | Admitting: Licensed Clinical Social Worker

## 2017-06-21 DIAGNOSIS — F250 Schizoaffective disorder, bipolar type: Secondary | ICD-10-CM | POA: Diagnosis not present

## 2017-06-21 DIAGNOSIS — F5104 Psychophysiologic insomnia: Secondary | ICD-10-CM

## 2017-06-21 NOTE — Progress Notes (Signed)
   THERAPIST PROGRESS NOTE  Session Time: 9-9:45am  Participation Level: Minimal  Behavioral Response: Casual and Fairly GroomedConfusedEuthymic  Type of Therapy: Individual Therapy  Treatment Goals addressed: Coping  Interventions: CBT and Motivational Interviewing  Summary: Vick FreesRawmanne H Ettinger is a 36 y.o. male who presents with Schizoaffective disorder, mood instability, and interpersonal problems related to his anger. He reports he has had a good week and not gotten overly anger or had any outbursts at his loved ones. He states his medications are working well and he is not experiencing any bad side effects. He is quiet, reserved, and soft spoken today. He reports he recently went to ED for muscle spasm. Some strategies for controlling muscle pain were discussed including yoga, massage, and acupuncture. Psychoeducation was given about medications "masking pain but not healing it". Pt was somewhat receptive but admitted he felt discouraged that "he can't even sweep anymore".   Suicidal/Homicidal: Nowithout intent/plan  Therapist Response: Counselor used open questions and psychoeducation to help pt explore options for treating pain.  Plan: Return again in 1 weeks.  Diagnosis:    ICD-10-CM   1. Schizoaffective disorder, bipolar type (HCC) F25.0   2. Psychophysiological insomnia F51.04       Margo CommonWesley E Swan 06/21/2017

## 2017-07-01 ENCOUNTER — Ambulatory Visit (HOSPITAL_COMMUNITY): Payer: Self-pay | Admitting: Psychiatry

## 2017-07-05 ENCOUNTER — Ambulatory Visit (HOSPITAL_COMMUNITY): Payer: Self-pay | Admitting: Licensed Clinical Social Worker

## 2017-07-07 ENCOUNTER — Ambulatory Visit (INDEPENDENT_AMBULATORY_CARE_PROVIDER_SITE_OTHER): Payer: Medicaid Other | Admitting: Licensed Clinical Social Worker

## 2017-07-07 DIAGNOSIS — F250 Schizoaffective disorder, bipolar type: Secondary | ICD-10-CM

## 2017-07-08 ENCOUNTER — Encounter (HOSPITAL_COMMUNITY): Payer: Self-pay | Admitting: Licensed Clinical Social Worker

## 2017-07-08 NOTE — Progress Notes (Signed)
   THERAPIST PROGRESS NOTE  Session Time: 10-11  Participation Level: Active  Behavioral Response: Casual and Fairly GroomedAlertEuthymic  Type of Therapy: Individual Therapy  Treatment Goals addressed: Anxiety  Interventions: CBT, Solution Focused and Strength-based  Summary: Shawn Leach is a 36 y.o. male who presents with worry and irritability related to anxiety and depression from schizoaffective disorder. He presents as active and engaged and states his past 2 weeks have been very good. He denies any "serious problems". Pt and counselor then explore pt's relationship to others and how pt is "constantly giving other people what they want" but feels taken advantage of often. Counselor spends some time discussing pt's children and their lifestyle/habits. Pt admits he is able to support his family in a way that "makes others jealous" in the neighborhood. Counselor used motivational interviewing techniques to help pt confront his feelings on "having fun and friends". Pt admitted he does not trust others because "they always want something". Counselor spent some time asking about pt's childhood and pt discussed how his mother "was always overlooking me and kicked me out at 26 bc I bought a TV w/o discussing it with her first."   Suicidal/Homicidal: Nowithout intent/plan  Therapist Response: Counselor used anger management techniques and open questions to help pt gain insight into his relationships to others and the patterns of instability and stress he is caused.   Plan: Return again in 2 weeks.  Diagnosis:    ICD-10-CM   1. Schizoaffective disorder, bipolar type [facility]) F25.0        Margo Common, LCAS-A 07/08/2017

## 2017-07-27 ENCOUNTER — Ambulatory Visit (HOSPITAL_COMMUNITY): Payer: Medicaid Other | Admitting: Psychiatry

## 2017-07-28 ENCOUNTER — Ambulatory Visit (INDEPENDENT_AMBULATORY_CARE_PROVIDER_SITE_OTHER): Payer: Medicaid Other | Admitting: Licensed Clinical Social Worker

## 2017-07-28 DIAGNOSIS — F250 Schizoaffective disorder, bipolar type: Secondary | ICD-10-CM | POA: Diagnosis not present

## 2017-07-29 ENCOUNTER — Encounter (HOSPITAL_COMMUNITY): Payer: Self-pay | Admitting: Licensed Clinical Social Worker

## 2017-07-29 NOTE — Progress Notes (Signed)
   THERAPIST PROGRESS NOTE  Session Time: 10-11  Participation Level: Active  Behavioral Response: Casual and Well GroomedAlertAnxious  Type of Therapy: Individual Therapy  Treatment Goals addressed: Anxiety and Coping  Interventions: CBT and Solution Focused  Summary: Shawn Leach is a 36 y.o. male who presents with stressors, worry, and anxiety related to his Bipolar dx. He reports he is taking his medication as px and seeing good results. He reports he recently returned from a cruise to [place] which helped him. He was proud that he remembered his medications on the cruise. Counselor and pt discussed pt hx of allowing other family and friends to take advantage of his kindness and generosity. Pt admitted he feels less stress since he started focusing on taking care of himself and his family only.   Suicidal/Homicidal: Nowithout intent/plan  Therapist Response: Counselor used open supportive questions. Counselor helped pt identify his main stressors and how changes he has made have positively impacted his stress level and inner peace.   Plan: Return again in 2 weeks.  Diagnosis:    ICD-10-CM   1. Schizoaffective disorder, bipolar type [facility]) F25.0        Margo Common, LCAS-A 07/29/2017

## 2017-08-15 ENCOUNTER — Ambulatory Visit (HOSPITAL_COMMUNITY): Payer: Self-pay | Admitting: Licensed Clinical Social Worker

## 2017-08-29 ENCOUNTER — Encounter: Payer: Self-pay | Admitting: Internal Medicine

## 2017-08-29 ENCOUNTER — Ambulatory Visit (INDEPENDENT_AMBULATORY_CARE_PROVIDER_SITE_OTHER): Payer: Medicaid Other | Admitting: Internal Medicine

## 2017-08-29 VITALS — BP 118/69 | HR 67 | Temp 98.4°F | Ht 65.0 in | Wt 140.4 lb

## 2017-08-29 DIAGNOSIS — F259 Schizoaffective disorder, unspecified: Secondary | ICD-10-CM | POA: Diagnosis present

## 2017-08-29 DIAGNOSIS — Z0000 Encounter for general adult medical examination without abnormal findings: Secondary | ICD-10-CM

## 2017-08-29 DIAGNOSIS — Z23 Encounter for immunization: Secondary | ICD-10-CM | POA: Diagnosis not present

## 2017-08-29 NOTE — Patient Instructions (Signed)
Thank you for your visit today Please follow up in 6 months

## 2017-08-30 NOTE — Assessment & Plan Note (Signed)
Patient is here for a routine follow up.   Plan -given flu vaccine

## 2017-08-30 NOTE — Assessment & Plan Note (Signed)
Patient currently takes depakote and trazodone. He fills them at an outpatient clinic which he does not remember the name of.  Since he is on depakote, this requires periodic monitoring of LFTs as depakote can cause hepatic failure, and also CBC.  Plan -obtain CBC and CMET--> patient went to lab and stated he did not want labs drawn

## 2017-08-30 NOTE — Progress Notes (Signed)
    CC: schizoaffective disorder, health maintenance HPI: Mr.Ebert H Poehler is a 36 y.o. man with PMH noted below here for bipolar and HM  Please see Problem List/A&P for the status of the patient's chronic medical problems   Past Medical History:  Diagnosis Date  . Bipolar 1 disorder (HCC)   . Bradycardia    asymptomatic, normal EKG and BMET  . Gonococcal urethritis    hx in 5/03  . High risk homosexual behavior    Hx of relationship with man 2002-2005, no known disease in the partnet  . History of psychiatric disorder    unknown prior diagnosis  . Schizophrenia (HCC)   . Tympanic membrane rupture    history of    Review of Systems:  Constitutional: Negative for fever, chills, weight loss and malaise/fatigue.   Respiratory: Negative for cough, shortness of breath and wheezing.    Gastrointestinal: Negative for heartburn, nausea, vomiting, abdominal pain, diarrhea and constipation Musculoskeletal: Negative for myalgias, joint pain, falls and neck pain.     Physical Exam: Vitals:   08/29/17 1319  BP: 118/69  Pulse: 67  Temp: 98.4 F (36.9 C)  TempSrc: Oral  SpO2: 98%  Weight: 140 lb 6.4 oz (63.7 kg)  Height:  (1.651 m)    General: A&O, in NAD Neck: supple, midline trachea CV: RRR, normal s1, s2, no m/r/g Resp: equal and symmetric breath sounds, no wheezing or crackles  Abdomen: soft, nontender, nondistended, +BS Skin: warm, dry, intact Extremities: pulses intact b/l, no edema   Assessment & Plan:   See encounters tab for problem based medical decision making. Patient discussed with Dr. Criselda Peaches

## 2017-09-01 NOTE — Progress Notes (Signed)
Internal Medicine Clinic Attending  Case discussed with Dr. Saraiya at the time of the visit.  We reviewed the resident's history and exam and pertinent patient test results.  I agree with the assessment, diagnosis, and plan of care documented in the resident's note.  

## 2017-09-05 ENCOUNTER — Ambulatory Visit (INDEPENDENT_AMBULATORY_CARE_PROVIDER_SITE_OTHER): Payer: Medicaid Other | Admitting: Licensed Clinical Social Worker

## 2017-09-05 ENCOUNTER — Encounter (HOSPITAL_COMMUNITY): Payer: Self-pay | Admitting: Licensed Clinical Social Worker

## 2017-09-05 DIAGNOSIS — F250 Schizoaffective disorder, bipolar type: Secondary | ICD-10-CM | POA: Diagnosis not present

## 2017-09-05 NOTE — Progress Notes (Signed)
   THERAPIST PROGRESS NOTE  Session Time: 9-10  Participation Level: Active  Behavioral Response: Casual and Fairly GroomedAlertEuthymic  Type of Therapy: Individual Therapy  Treatment Goals addressed: Coping  Interventions: CBT, Strength-based, Supportive and Social Skills Training  Summary: Shawn Leach is a 36 y.o. male who presents with hx of manic behavior, schizoaffective, and anger outbursts. Pt reports his mood is stable and positive. 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 Pt states his medications are stable and "working perfectly".  Counselor and pt discuss pt's relationship to sprituality and how pt was instructed from a young age to "treat people right and help others out when you can". 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    Suicidal/Homicidal: Nowithout intent/plan  Therapist Response: Counselor used open questions, genuine positive regard, and emotional reflection to help pt build his self awareness and identify helpful coping strategies for managing his anxiety, anger, and schizoaffective disorder.   Plan: Return again in 2 weeks.  Diagnosis:    ICD-10-CM   1. Schizoaffective disorder, bipolar type [facility](HCC) F25.0       Margo CommonWesley E Swan, LCAS-A 09/05/2017

## 2017-09-07 ENCOUNTER — Ambulatory Visit (HOSPITAL_COMMUNITY): Payer: Self-pay | Admitting: Psychiatry

## 2017-10-03 ENCOUNTER — Ambulatory Visit (INDEPENDENT_AMBULATORY_CARE_PROVIDER_SITE_OTHER): Payer: Medicaid Other | Admitting: Licensed Clinical Social Worker

## 2017-10-03 ENCOUNTER — Encounter (HOSPITAL_COMMUNITY): Payer: Self-pay | Admitting: Licensed Clinical Social Worker

## 2017-10-03 DIAGNOSIS — F250 Schizoaffective disorder, bipolar type: Secondary | ICD-10-CM | POA: Diagnosis not present

## 2017-10-03 NOTE — Progress Notes (Signed)
   THERAPIST PROGRESS NOTE  Session Time: 10-11  Participation Level: Active  Behavioral Response: Casual and Fairly GroomedAlertEuthymic  Type of Therapy: Individual Therapy  Treatment Goals addressed: Coping  Interventions: CBT and Psychosocial Skills: Test Taking, breathing exercises  Summary: Shawn Leach is a 36 y.o. male who presents with hx of schizoaffective disorder and trouble w/ sleep. He states he continues to experience "good sleep, no negative thoughts, or trouble w/ his s/o [name]".   Counselor asks pt what he would like to work on today. Pt is unable to identify any specific need. Counselor suggests test prep [facility], which pt has identified in his past as a goal. Counselor and pt look at [email or web address] and go through practice test questions together. Counselor provides suggestions on managing test anxiety w/ deep breathing exercises.  Pt states he hopes to take the test after the holidays.   Suicidal/Homicidal: Nowithout intent/plan  Therapist Response: Counselor used psychoeducation and challenged pt's catastrophizing around test taking fears.  Plan: Return again in 4 weeks.  Diagnosis:    ICD-10-CM   1. Schizoaffective disorder, bipolar type [facility](HCC) F25.0        Margo CommonWesley E Swan, LCAS-A 10/03/2017

## 2017-10-25 ENCOUNTER — Ambulatory Visit (HOSPITAL_COMMUNITY): Payer: Self-pay | Admitting: Licensed Clinical Social Worker

## 2017-10-28 NOTE — Addendum Note (Signed)
Addended by: Deneise LeverSARAIYA, Alveda Vanhorne A on: 10/28/2017 11:05 AM   Modules accepted: Orders

## 2017-11-04 ENCOUNTER — Encounter (HOSPITAL_COMMUNITY): Payer: Self-pay | Admitting: Psychiatry

## 2017-11-04 ENCOUNTER — Ambulatory Visit (INDEPENDENT_AMBULATORY_CARE_PROVIDER_SITE_OTHER): Payer: Medicaid Other | Admitting: Psychiatry

## 2017-11-04 DIAGNOSIS — F250 Schizoaffective disorder, bipolar type: Secondary | ICD-10-CM | POA: Diagnosis not present

## 2017-11-04 DIAGNOSIS — F5104 Psychophysiologic insomnia: Secondary | ICD-10-CM | POA: Diagnosis not present

## 2017-11-04 DIAGNOSIS — Z79899 Other long term (current) drug therapy: Secondary | ICD-10-CM

## 2017-11-04 MED ORDER — TRAZODONE HCL 100 MG PO TABS: 100.0000 mg | ORAL_TABLET | Freq: Every day | ORAL | 1 refills | Status: DC

## 2017-11-04 MED ORDER — DIVALPROEX SODIUM ER 500 MG PO TB24: 1000.0000 mg | ORAL_TABLET | Freq: Every day | ORAL | 1 refills | Status: DC

## 2017-11-04 NOTE — Progress Notes (Signed)
BH MD/PA/NP OP Progress Note  11/04/2017 10:59 AM Shawn Leach  MRN:  [id number]  Chief Complaint: Med check  HPI: Patient reports that things have been quite stable with his mood.  He feels like he is able to control his anger well, denies any physical acting out or aggression.  He reports that he sleeps well at night when he takes the Depakote and trazodone.  He reports that he is fairly consistent with taking it.  He was agreeable to have a Depakote level drawn today and check his liver function for routine medication monitoring.  He reports that he drinks alcohol 3-4 times per week, and he uses marijuana about 1-2 times per week.  Denies any other substance use.  He reports that he exercises typically every day, continues to be very involved in taking care of his kids.  Reports that his relationship with his girlfriend has been fairly good and they are living together now.  He agrees to follow-up in 6 months or sooner if needed.  Visit Diagnosis:    ICD-10-CM   1. Schizoaffective disorder, bipolar type (HCC) F25.0 divalproex (DEPAKOTE ER) 500 MG 24 hr tablet  2. Psychophysiological insomnia F51.04 traZODone (DESYREL) 100 MG tablet    Past Psychiatric History: See intake H&P for full details. Reviewed, with no updates at this time.   Past Medical History:  Past Medical History:  Diagnosis Date  . Bipolar 1 disorder (HCC)   . Bradycardia    asymptomatic, normal EKG and BMET  . Gonococcal urethritis    hx in 5/03  . High risk homosexual behavior    Hx of relationship with man 2002-2005, no known disease in the partnet  . History of psychiatric disorder    unknown prior diagnosis  . Schizophrenia (HCC)   . Tympanic membrane rupture    history of   History reviewed. No pertinent surgical history.  Family Psychiatric History: See intake H&P for full details. Reviewed, with no updates at this time.   Family History: History reviewed. No pertinent family history.  Social  History:  Social History   Socioeconomic History  . Marital status: Single    Spouse name: None  . Number of children: None  . Years of education: None  . Highest education level: None  Social Needs  . Financial resource strain: None  . Food insecurity - worry: None  . Food insecurity - inability: None  . Transportation needs - medical: None  . Transportation needs - non-medical: None  Occupational History  . None  Tobacco Use  . Smoking status: Former Smoker    Types: Cigars    Last attempt to quit: 01/14/2011    Years since quitting: 6.8  . Smokeless tobacco: Never Used  . Tobacco comment: Black and Milds  Substance and Sexual Activity  . Alcohol use: Yes    Alcohol/week: 0.6 oz    Types: 1 Cans of beer per week    Comment: daily  . Drug use: Yes    Frequency: 2.0 times per week    Types: Marijuana  . Sexual activity: Yes    Partners: Female    Comment: disabled on SSI, single, has a girlfriend (now only heterosexual) and 2 daughters,   Other Topics Concern  . None  Social History Narrative  . None    Allergies: No Known Allergies  Metabolic Disorder Labs: Lab Results  Component Value Date   HGBA1C 5.8 07/28/2009   No results found for: PROLACTIN Lab Results  Component Value Date   CHOL 199 02/21/2017   TRIG 247 (H) 02/21/2017   HDL 72 02/21/2017   CHOLHDL 2.8 02/21/2017   LDLCALC 78 02/21/2017   No results found for: TSH  Therapeutic Level Labs: No results found for: LITHIUM Lab Results  Component Value Date   VALPROATE <12.5 (L) 02/26/2015   VALPROATE <10.0 (L) 06/28/2014   No components found for:  CBMZ  Current Medications: Current Outpatient Medications  Medication Sig Dispense Refill  . divalproex (DEPAKOTE ER) 500 MG 24 hr tablet Take 2 tablets (1,000 mg total) by mouth daily. 180 tablet 1  . traZODone (DESYREL) 100 MG tablet Take 1 tablet (100 mg total) by mouth at bedtime. 90 tablet 1  . cyclobenzaprine (FLEXERIL) 10 MG tablet Take 1  tablet (10 mg total) by mouth 3 (three) times daily as needed for muscle spasms. (Patient not taking: Reported on 08/29/2017) 30 tablet 0   No current facility-administered medications for this visit.      Musculoskeletal: Strength & Muscle Tone: within normal limits Gait & Station: normal Patient leans: N/A  Psychiatric Specialty Exam: ROS  Blood pressure 122/74, pulse (!) 51, height 5\' 9"  (1.753 m), weight 140 lb (63.5 kg).Body mass index is 20.67 kg/m.  General Appearance: Casual, Fairly Groomed, Guarded and baseline  Eye Contact:  Fair  Speech:  Clear and Coherent  Volume:  Normal  Mood:  Euthymic  Affect:  Flat  Thought Process:  Goal Directed and Descriptions of Associations: Intact  Orientation:  Full (Time, Place, and Person)  Thought Content: Logical   Suicidal Thoughts:  No  Homicidal Thoughts:  No  Memory:  Immediate;   Fair  Judgement:  Fair  Insight:  Shallow  Psychomotor Activity:  Normal  Concentration:  Attention Span: Fair  Recall:  FiservFair  Fund of Knowledge: Fair  Language: Fair  Akathisia:  Negative  Handed:  Right  AIMS (if indicated): not done  Assets:  Communication Skills Desire for Improvement Financial Resources/Insurance Housing Social Support Transportation  ADL's:  Intact  Cognition: WNL  Sleep:  Good   Screenings: PHQ2-9     [facility]  PHQ-2 Total Score  0  0  2  0  0  PHQ-9 Total Score  No data  No data  8  No data  No data       Assessment and Plan:  Shawn Leach presents with stable mood and sleep on the medication regimen as below.  He does not present with any gross paranoia, disorganization, or positive  symptoms of psychosis.  He presents as generally guarded and minimizing at baseline.  Things seem to be stable in terms of his relationship with his girlfriend, and he continues to be an active and involved father.  He exercises daily.  He acknowledges that he does drink on a fairly regular basis, 1-2 drinks in a sitting about 3-4 days/week.  He uses marijuana sporadically, but not on a daily basis.  He was agreeable to obtaining labs today for medication management and following up in 6 months for med check.  1. Schizoaffective disorder, bipolar type (HCC)   2. Psychophysiological insomnia     Status of current problems: stable  Labs  Ordered: No orders of the defined types were placed in this encounter.   Labs Reviewed: n/a  Collateral Obtained/Records Reviewed: Reviewed therapy notes, he had participated in 7 sessions with [facility], and will follow up with [facility] after Christmas.  Plan:  Continue Depakote ER 1000 mg nightly Continue trazodone 100 mg nightly Patient recently declined labs at primary care visit in October Encouraged patient to obtain CBC, CMP, and Depakote in this office today Return to clinic in 6 months Continue individual therapy for support and continued mental health contact  I spent 15 minutes with the patient in direct face-to-face clinical care.  Greater than 50% of this time was spent in counseling and coordination of care with the patient.    Burnard Leigh, MD 11/04/2017, 10:59 AM

## 2017-11-04 NOTE — Addendum Note (Signed)
Addended by: Georgian CoATKINS, Jahmire Ruffins E on: 11/04/2017 11:05 AM   Modules accepted: Orders

## 2017-11-09 LAB — COMPREHENSIVE METABOLIC PANEL
A/G RATIO: 1.6 (ref 1.2–2.2)
ALK PHOS: 50 IU/L (ref 39–117)
ALT: 16 IU/L (ref 0–44)
AST: 23 IU/L (ref 0–40)
Albumin: 4.4 g/dL (ref 3.5–5.5)
BILIRUBIN TOTAL: 0.2 mg/dL (ref 0.0–1.2)
BUN/Creatinine Ratio: 11 (ref 9–20)
BUN: 11 mg/dL (ref 6–20)
CHLORIDE: 101 mmol/L (ref 96–106)
CO2: 24 mmol/L (ref 20–29)
Calcium: 9.5 mg/dL (ref 8.7–10.2)
Creatinine, Ser: 0.97 mg/dL (ref 0.76–1.27)
GFR calc non Af Amer: 101 mL/min/{1.73_m2} (ref >59)
GFR, EST AFRICAN AMERICAN: 117 mL/min/{1.73_m2} (ref >59)
Globulin, Total: 2.8 g/dL (ref 1.5–4.5)
Glucose: 99 mg/dL (ref 65–99)
POTASSIUM: 4.3 mmol/L (ref 3.5–5.2)
Sodium: 138 mmol/L (ref 134–144)
TOTAL PROTEIN: 7.2 g/dL (ref 6.0–8.5)

## 2017-11-09 LAB — VALPROIC ACID LEVEL

## 2017-11-10 ENCOUNTER — Ambulatory Visit (HOSPITAL_COMMUNITY): Payer: Self-pay | Admitting: Licensed Clinical Social Worker

## 2017-11-23 ENCOUNTER — Ambulatory Visit (HOSPITAL_COMMUNITY): Payer: Self-pay | Admitting: Licensed Clinical Social Worker

## 2017-12-06 ENCOUNTER — Ambulatory Visit (HOSPITAL_COMMUNITY): Payer: Self-pay | Admitting: Licensed Clinical Social Worker

## 2017-12-13 ENCOUNTER — Ambulatory Visit (HOSPITAL_COMMUNITY): Payer: Self-pay | Admitting: Licensed Clinical Social Worker

## 2017-12-15 ENCOUNTER — Encounter: Payer: Self-pay | Admitting: *Deleted

## 2017-12-22 ENCOUNTER — Encounter (HOSPITAL_COMMUNITY): Payer: Self-pay | Admitting: Licensed Clinical Social Worker

## 2017-12-22 ENCOUNTER — Ambulatory Visit (INDEPENDENT_AMBULATORY_CARE_PROVIDER_SITE_OTHER): Payer: Medicaid Other | Admitting: Licensed Clinical Social Worker

## 2017-12-22 DIAGNOSIS — F250 Schizoaffective disorder, bipolar type: Secondary | ICD-10-CM | POA: Diagnosis not present

## 2017-12-22 NOTE — Progress Notes (Signed)
   THERAPIST PROGRESS NOTE  Session Time: 9-10  Participation Level: Active  Behavioral Response: Casual, Neat and Well GroomedAlertEuthymic  Type of Therapy: Individual Therapy  Treatment Goals addressed: Coping  Interventions: CBT and Psychosocial Skills: Budgeting, Financial goals  Summary: Shawn Leach is a 37 y.o. male who presents with hx of Schizoaffective and Bipolar. He was active, engaged, and upbeat in session. Pt stated he continues to feel better and see increased functioning. He reported he has not had any anger outbursts or serious agitation recently. Counselor and pt spent majority of session discussing pt's financial situation, his lifestyle of living on disability, and discussing future goals to coming off SSI disability because the income is too low for his goals of owning a house.   Pt stated his sleep and mood are all stable and he is taking his medications regularly.  Suicidal/Homicidal: Nowithout intent/plan  Therapist Response: Counselor used CBT style socratic questioning to help pt gain insight into his financial options and how to meet future goals of saving money and owning a house. Pt appeared to have growing insight into his need to work w/o disability so that he can bring in more than $40/week.  Plan: Return again in 4 weeks.  Diagnosis:    ICD-10-CM   1. Schizoaffective disorder, bipolar type [facility](HCC) F25.0        Margo CommonWesley E Sennie Borden, LCAS-A 12/22/2017

## 2018-01-05 ENCOUNTER — Ambulatory Visit (HOSPITAL_COMMUNITY): Payer: Self-pay | Admitting: Licensed Clinical Social Worker

## 2018-01-20 ENCOUNTER — Ambulatory Visit (HOSPITAL_COMMUNITY): Payer: Self-pay | Admitting: Psychiatry

## 2018-01-24 ENCOUNTER — Ambulatory Visit (HOSPITAL_COMMUNITY): Payer: Self-pay | Admitting: Licensed Clinical Social Worker

## 2018-02-23 ENCOUNTER — Ambulatory Visit: Payer: Medicaid Other | Admitting: Internal Medicine

## 2018-02-23 ENCOUNTER — Encounter: Payer: Self-pay | Admitting: Internal Medicine

## 2018-02-23 VITALS — BP 113/66 | HR 75 | Temp 98.2°F | Wt 141.1 lb

## 2018-02-23 DIAGNOSIS — F250 Schizoaffective disorder, bipolar type: Secondary | ICD-10-CM | POA: Diagnosis present

## 2018-02-23 DIAGNOSIS — Z79899 Other long term (current) drug therapy: Secondary | ICD-10-CM | POA: Diagnosis not present

## 2018-02-23 DIAGNOSIS — R29898 Other symptoms and signs involving the musculoskeletal system: Secondary | ICD-10-CM

## 2018-02-23 HISTORY — DX: Other symptoms and signs involving the musculoskeletal system: R29.898

## 2018-02-23 NOTE — Progress Notes (Signed)
   CC: Schizoaffective disorder, bipolar type; Left shoulder weakness; Check-Up  HPI:  [name] is a 37 y.o. M with PMHx listed below presenting for Schizoaffective disorder, bipolar type; Check-Up. Please see the A&P for the status of the patient's chronic medical problems.  Patient states that when working out at the gym his left shoulder becomes tired before his right shoulder does. He denies and pain or other sensations. He does not have weakness in other muscles. The weakness is only with working out the shoulder and is resolved by the next day. This occurs in his left (non-dominant) shoulder.  Past Medical History:  Diagnosis Date  . Bipolar 1 disorder (HCC)   . Bradycardia    asymptomatic, normal EKG and BMET  . Gonococcal urethritis    hx in 5/03  . High risk homosexual behavior    Hx of relationship with man 2002-2005, no known disease in the partnet  . History of psychiatric disorder    unknown prior diagnosis  . Schizophrenia (HCC)   . Tympanic membrane rupture    history of   Review of Systems: Performed and all others negative.  Physical Exam:  Vitals:   02/23/18 1526  BP: 113/66  Pulse: 75  Temp: 98.2 F (36.8 C)  TempSrc: Oral  SpO2: 96%  Weight: 141 lb 1.6 oz (64 kg)   Physical Exam  Constitutional: He is oriented to person, place, and time. He appears well-developed and well-nourished. No distress.  HENT:  Head: Normocephalic and atraumatic.  Cardiovascular: Normal rate, regular rhythm, normal heart sounds and intact distal pulses.  Pulmonary/Chest: Effort normal and breath sounds normal. No respiratory distress.  Abdominal: Soft. Bowel sounds are normal. He exhibits no distension. There is no tenderness.  Musculoskeletal: He exhibits no edema, tenderness or deformity.  Neurological: He is alert and oriented to person, place, and time.  Arm and shoulder strength 5/5 Bilateral UEs  Skin: Skin is warm and dry.  Psychiatric: He has a normal  mood and affect. His behavior is normal.    Assessment & Plan:   See Encounters Tab for problem based charting.  Patient discussed with Dr. Cleda DaubE. Hoffman

## 2018-02-23 NOTE — Assessment & Plan Note (Signed)
Patient states he has been taking his medications as prescribed, especially Depakote since his last psychiatry visit (at which he was noted to be subtherapeutic). He had LFTs checked at that visit, which were normal as well. He states he continues to do well on his medications and that the Depakote continues to control is anger well. He states he has follow up with Mental Health on 4/18 (confirmed in EMR). - Continue Depakote 500mg  Daily - Continue Trazodone 100mg  qhs - Follow up with Mental Health as scheduled

## 2018-02-23 NOTE — Assessment & Plan Note (Signed)
Patient states that when working out at the gym his left shoulder becomes tired before his right shoulder does. He denies and pain or other sensations. He does not have weakness in other muscles. The weakness is only with working out the shoulder and is resolved by the next day. This occurs in his left (non-dominant) shoulder.  On exam, no weakness or tenderness appreciated. Normal tone. Shoulder are symmetric. Suspect patients Left shoulder weakness is due to this being his non dominant arm. Will monitor for an concerning symptoms.

## 2018-02-23 NOTE — Patient Instructions (Signed)
Thank you for allowing us to care for you  For your Schizoaffective disorder - Continue taking your medications as prescribed - Follow up with your psychiatrist as scheduled  Please return for check up visit in 1 year

## 2018-02-27 NOTE — Progress Notes (Signed)
Internal Medicine Clinic Attending  Case discussed with Dr. Melvin  at the time of the visit.  We reviewed the resident's history and exam and pertinent patient test results.  I agree with the assessment, diagnosis, and plan of care documented in the resident's note.  

## 2018-02-27 NOTE — Addendum Note (Signed)
Addended by: Beola CordMELVIN, ALEXANDER B on: 02/27/2018 03:55 PM   Modules accepted: Level of Service

## 2018-03-02 ENCOUNTER — Encounter (HOSPITAL_COMMUNITY): Payer: Self-pay | Admitting: Licensed Clinical Social Worker

## 2018-03-02 ENCOUNTER — Ambulatory Visit (INDEPENDENT_AMBULATORY_CARE_PROVIDER_SITE_OTHER): Payer: Medicaid Other | Admitting: Licensed Clinical Social Worker

## 2018-03-02 DIAGNOSIS — F250 Schizoaffective disorder, bipolar type: Secondary | ICD-10-CM | POA: Diagnosis not present

## 2018-03-02 NOTE — Progress Notes (Signed)
   THERAPIST PROGRESS NOTE  Session Time: 9-10  Participation Level: Active  Behavioral Response: Casual and DisheveledAlertEuthymic  Type of Therapy: Individual Therapy  Treatment Goals addressed: Coping  Interventions: CBT and Supportive  Summary: Shawn Leach is a 37 y.o. male who presents with Schizoaffective disorder, bipolar type managed w/ Depakote. Pt reports he has not had any significant mental health issues since last visit. Pt reports he has been isolating to avoid getting into arguments and avoids other people who "always ask him for favors and money". 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  "I still take my Depakote and it helps; I think it helps my irritability, it makes me more mellow". "No complaints, all is well". "I get into little simple conflicts w/ [name] but they're easily to get past". "I'm planning to take my drivers test in May so I can finally drive myself".   Suicidal/Homicidal: Nowithout intent/plan  Therapist Response: Counselor used open questions and reflection to help encourage pt. Counselor used immediacy and accurate empathy. Counselor assessed pt level of functioning. Counselor assessed pt's medication compliance.    Plan: Return again in 8 weeks.  Diagnosis:    ICD-10-CM   1. Schizoaffective disorder, bipolar type [facility](HCC) F25.0        Margo CommonWesley E Marilena Trevathan, LCAS-A 03/02/2018

## 2018-03-14 ENCOUNTER — Ambulatory Visit (INDEPENDENT_AMBULATORY_CARE_PROVIDER_SITE_OTHER): Payer: Medicaid Other | Admitting: Psychiatry

## 2018-03-14 ENCOUNTER — Encounter (HOSPITAL_COMMUNITY): Payer: Self-pay | Admitting: Psychiatry

## 2018-03-14 DIAGNOSIS — F250 Schizoaffective disorder, bipolar type: Secondary | ICD-10-CM

## 2018-03-14 DIAGNOSIS — F5104 Psychophysiologic insomnia: Secondary | ICD-10-CM

## 2018-03-14 DIAGNOSIS — Z87891 Personal history of nicotine dependence: Secondary | ICD-10-CM

## 2018-03-14 DIAGNOSIS — Z79899 Other long term (current) drug therapy: Secondary | ICD-10-CM | POA: Diagnosis not present

## 2018-03-14 MED ORDER — TRAZODONE HCL 100 MG PO TABS: 100.0000 mg | ORAL_TABLET | Freq: Every day | ORAL | 1 refills | Status: DC

## 2018-03-14 MED ORDER — DIVALPROEX SODIUM ER 500 MG PO TB24: 1000.0000 mg | ORAL_TABLET | Freq: Every day | ORAL | 1 refills | Status: DC

## 2018-03-14 NOTE — Progress Notes (Signed)
BH MD/PA/NP OP Progress Note  03/14/2018 3:26 PM Shawn Leach  MRN:  [id number]  Chief Complaint:  doing well HPI: Shawn Leach reports overall mood, sleep stability. No side effects from medications. Has some forms that he needs filled out about IRS/disability that require doctor signature.  Suggest he have this faxed over so we can see if these are forms we can assist him with. No acute safety issues. Continues to spend time with his children. Understands Clinical research associate is transitioning out of office end of august, and follow-up with Dr. Lolly Mustache at that point.   Visit Diagnosis:    ICD-10-CM   1. Schizoaffective disorder, bipolar type (HCC) F25.0 divalproex (DEPAKOTE ER) 500 MG 24 hr tablet  2. Psychophysiological insomnia F51.04 traZODone (DESYREL) 100 MG tablet    Past Psychiatric History: See intake H&P for full details. Reviewed, with no updates at this time.   Past Medical History:  Past Medical History:  Diagnosis Date  . Bipolar 1 disorder (HCC)   . Bradycardia    asymptomatic, normal EKG and BMET  . Gonococcal urethritis    hx in 5/03  . High risk homosexual behavior    Hx of relationship with man 2002-2005, no known disease in the partnet  . History of psychiatric disorder    unknown prior diagnosis  . Schizophrenia (HCC)   . Shoulder weakness 02/23/2018  . Tympanic membrane rupture    history of   No past surgical history on file.  Family Psychiatric History: See intake H&P for full details. Reviewed, with no updates at this time.   Family History: No family history on file.  Social History:  Social History   Socioeconomic History  . Marital status: Single    Spouse name: Not on file  . Number of children: Not on file  . Years of education: Not on file  . Highest education level: Not on file  Occupational History  . Not on file  Social Needs  . Financial resource strain: Not on file  . Food insecurity:    Worry: Not on file    Inability: Not on file   . Transportation needs:    Medical: Not on file    Non-medical: Not on file  Tobacco Use  . Smoking status: Former Smoker    Types: Cigars    Last attempt to quit: 01/14/2011    Years since quitting: 7.1  . Smokeless tobacco: Never Used  . Tobacco comment: Black and Milds  Substance and Sexual Activity  . Alcohol use: Yes    Alcohol/week: 0.6 oz    Types: 1 Cans of beer per week    Comment: daily  . Drug use: Yes    Frequency: 2.0 times per week    Types: Marijuana  . Sexual activity: Yes    Partners: Female    Comment: disabled on SSI, single, has a girlfriend (now only heterosexual) and 2 daughters,   Lifestyle  . Physical activity:    Days per week: Not on file    Minutes per session: Not on file  . Stress: Not on file  Relationships  . Social connections:    Talks on phone: Not on file    Gets together: Not on file    Attends religious service: Not on file    Active member of club or organization: Not on file    Attends meetings of clubs or organizations: Not on file    Relationship status: Not on file  Other Topics Concern  .  Not on file  Social History Narrative  . Not on file    Allergies: No Known Allergies  Metabolic Disorder Labs: Lab Results  Component Value Date   HGBA1C 5.8 07/28/2009   No results found for: PROLACTIN Lab Results  Component Value Date   CHOL 199 02/21/2017   TRIG 247 (H) 02/21/2017   HDL 72 02/21/2017   CHOLHDL 2.8 02/21/2017   LDLCALC 78 02/21/2017   No results found for: TSH  Therapeutic Level Labs: No results found for: LITHIUM Lab Results  Component Value Date   VALPROATE <4 (L) 11/04/2017   VALPROATE <12.5 (L) 02/26/2015   No components found for:  CBMZ  Current Medications: Current Outpatient Medications  Medication Sig Dispense Refill  . divalproex (DEPAKOTE ER) 500 MG 24 hr tablet Take 2 tablets (1,000 mg total) by mouth daily. 180 tablet 1  . traZODone (DESYREL) 100 MG tablet Take 1 tablet (100 mg total) by  mouth at bedtime. 90 tablet 1   No current facility-administered medications for this visit.      Musculoskeletal: Strength & Muscle Tone: within normal limits Gait & Station: normal Patient leans: N/A  Psychiatric Specialty Exam: ROS  Blood pressure 112/66, pulse (!) 55, height  (1.753 m), weight 141 lb (64 kg), SpO2 96 %.Body mass index is 20.82 kg/m.  General Appearance: Casual and Fairly Groomed  Eye Contact:  Good  Speech:  Clear and Coherent and Normal Rate  Volume:  Normal  Mood:  Euthymic  Affect:  Congruent  Thought Process:  Coherent and Descriptions of Associations: Intact  Orientation:  Full (Time, Place, and Person)  Thought Content: Logical   Suicidal Thoughts:  No  Homicidal Thoughts:  No  Memory:  Immediate;   Good  Judgement:  Fair  Insight:  Fair  Psychomotor Activity:  Normal  Concentration:  Concentration: Good  Recall:  Good  Fund of Knowledge: Good  Language: Good  Akathisia:  Negative  Handed:  Right  AIMS (if indicated): not done  Assets:  Communication Skills Desire for Improvement Financial Resources/Insurance Housing  ADL's:  Intact  Cognition: WNL  Sleep:  Good   Screenings: PHQ2-9     Office Visit from 08/29/2017 in [facility] Office Visit from 06/03/2017 in [facility] Office Visit from 02/21/2017 in [facility] Office Visit from 08/05/2016 in [facility] Office Visit from 07/22/2016 in [facility]  PHQ-2 Total Score  0  0  2  0  0  PHQ-9 Total Score  -  -  8  -  -       Assessment and Plan:  Shawn Leach presents as stable on the current regimen.  Some ongoing thought constriction and relates in a fairly concrete manner.  No acute safety issues, medication regimen tolerated without any side effects.  Will rtc in 4-6 months.  1. Schizoaffective disorder, bipolar type (HCC)   2. Psychophysiological insomnia      Status of current problems: stable  Labs Ordered: No orders of the defined types were placed in this encounter.   Labs Reviewed: n/a  Collateral Obtained/Records Reviewed: n/a  Plan:  Continue depakote 1000 mg nightly and trazodone 100 mg nightly rtc in 6 months Continue with wes for therapy  I spent 15 minutes with the patient in direct face-to-face clinical care.  Greater than 50% of this time was spent in counseling and coordination of care with  the patient.    Burnard Leigh, MD 03/14/2018, 3:26 PM

## 2018-03-22 ENCOUNTER — Ambulatory Visit (HOSPITAL_COMMUNITY): Payer: Self-pay | Admitting: Licensed Clinical Social Worker

## 2018-04-04 ENCOUNTER — Ambulatory Visit (INDEPENDENT_AMBULATORY_CARE_PROVIDER_SITE_OTHER): Payer: Medicaid Other | Admitting: Licensed Clinical Social Worker

## 2018-04-04 DIAGNOSIS — F250 Schizoaffective disorder, bipolar type: Secondary | ICD-10-CM

## 2018-04-13 NOTE — Progress Notes (Signed)
Pt did not show for appointment. When called pt states he "overslept".

## 2018-04-18 ENCOUNTER — Ambulatory Visit (INDEPENDENT_AMBULATORY_CARE_PROVIDER_SITE_OTHER): Payer: Medicaid Other | Admitting: Licensed Clinical Social Worker

## 2018-04-18 ENCOUNTER — Encounter (HOSPITAL_COMMUNITY): Payer: Self-pay | Admitting: Licensed Clinical Social Worker

## 2018-04-18 DIAGNOSIS — F250 Schizoaffective disorder, bipolar type: Secondary | ICD-10-CM | POA: Diagnosis not present

## 2018-04-18 NOTE — Progress Notes (Signed)
Shawn Leach is a [name] y.o. male patientOUTPATIENT THERAPIST DISCHARGE    Reason for Discharge: "Everything is fine". Pt no longer requires ongoing psychotherapy.  Discharge Diagnosis:    ICD-10-CM   1. Schizoaffective disorder, bipolar type (HCC) F25.0       Comments: Pt presented as lethargic and calm in session. He was inactive and not engaged. Pt appeared for session under the impression that he was obligated to continue therapy despite lacking interest talk therapy. Counselor and pt discussed pt's ongoing tx goals w/ understanding that pt would return to counseling only as needed since Pt reports continued and sustained progress w/ paranoia, delusions, medication compliance, anger management, and communication skills w/ his family. Pt agreed to return for f/u appointment w/ Dr. Lolly MustacheArfeen for medication check in September 2019.  Pt and counselor discussed pt's goals and documented said goals in tx plan. Pt electronically signed acknowledgment.     Margo CommonWesley E Swan, LCAS-A

## 2018-06-19 ENCOUNTER — Ambulatory Visit (INDEPENDENT_AMBULATORY_CARE_PROVIDER_SITE_OTHER): Payer: Medicaid Other | Admitting: Licensed Clinical Social Worker

## 2018-06-19 ENCOUNTER — Encounter (HOSPITAL_COMMUNITY): Payer: Self-pay | Admitting: Licensed Clinical Social Worker

## 2018-06-19 DIAGNOSIS — F250 Schizoaffective disorder, bipolar type: Secondary | ICD-10-CM

## 2018-06-19 NOTE — Progress Notes (Signed)
   THERAPIST PROGRESS NOTE  Session Time: 11-12  Participation Level: Active  Behavioral Response: Casual and DisheveledAlertEuthymic  Type of Therapy: Individual Therapy  Treatment Goals addressed: Coping  Interventions: CBT, Strength-based and Supportive  Summary: Shawn FreesRawmanne H Leach is a 37 y.o. male who presents with hx of Schizophrenia for a 2 mo check up. He denies any sxs and continues to remain compliant on his medications. He denies issues w/ his temper or feeling out of control. He discusses his plans to rent a new house and hopefully move into a better neighborhood and provide better for his kids. He states he plans to take a drivers written test this week and hopes to start driving soon.  Suicidal/Homicidal: Nowithout intent/plan  Therapist Response: Counselor used open questions, goal setting, and reflection to help pt set agenda for session and focus the discussion on productive topics. Counselor provided feedback on pt's appearance and possible reasons he is getting turned down for rental properties. Counselor provided online resources for locating rental properties in the area.  Plan: Return again in 8 weeks.  Diagnosis:    ICD-10-CM   1. Schizoaffective disorder, bipolar type [facility](HCC) F25.0        Margo CommonWesley E Swan, LCAS-A 06/19/2018

## 2018-07-07 ENCOUNTER — Ambulatory Visit: Payer: Self-pay

## 2018-07-10 ENCOUNTER — Ambulatory Visit (INDEPENDENT_AMBULATORY_CARE_PROVIDER_SITE_OTHER): Payer: Medicaid Other | Admitting: Internal Medicine

## 2018-07-10 ENCOUNTER — Other Ambulatory Visit: Payer: Self-pay

## 2018-07-10 DIAGNOSIS — Z79899 Other long term (current) drug therapy: Secondary | ICD-10-CM | POA: Diagnosis not present

## 2018-07-10 DIAGNOSIS — M549 Dorsalgia, unspecified: Secondary | ICD-10-CM

## 2018-07-10 DIAGNOSIS — F319 Bipolar disorder, unspecified: Secondary | ICD-10-CM | POA: Diagnosis not present

## 2018-07-10 DIAGNOSIS — M545 Low back pain: Secondary | ICD-10-CM

## 2018-07-10 DIAGNOSIS — F209 Schizophrenia, unspecified: Secondary | ICD-10-CM | POA: Diagnosis not present

## 2018-07-10 MED ORDER — CYCLOBENZAPRINE HCL 5 MG PO TABS: 5.0000 mg | ORAL_TABLET | Freq: Every day | ORAL | 0 refills | Status: AC | PRN

## 2018-07-10 NOTE — Assessment & Plan Note (Signed)
The patient presented with a 3 day history of back pain that he states started after her was cleaning up around the house. He describes his back pain as present in his lower back, 5/10 intensity, intermittently present, and radiating down his right leg. He has used a heating pad and a friend's muscle relaxant which he states has not helped.   Assessment and plan The patient has acute onset lower back pain that can be reproduced with palpation of the right flank. Lumbar spine xray done in June 2018 did not show any significant bony abnormalities.   The patient's symptoms are consistent with lumbar muscle spasm and therefore will treat with recommending the patient to use heat and muscle relaxant (flexeril 5mg  daily).

## 2018-07-10 NOTE — Progress Notes (Signed)
   CC: Back pain  HPI:  [name] is a 37 y.o. male with schizoaffective disorder and bipolar disorder who presents with back pain. Please see problem based charting for evaluation, assessment, and plan.  Past Medical History:  Diagnosis Date  . Bipolar 1 disorder (HCC)   . Bradycardia    asymptomatic, normal EKG and BMET  . Gonococcal urethritis    hx in 5/03  . High risk homosexual behavior    Hx of relationship with man 2002-2005, no known disease in the partnet  . History of psychiatric disorder    unknown prior diagnosis  . Schizophrenia (HCC)   . Shoulder weakness 02/23/2018  . Tympanic membrane rupture    history of   Review of Systems:    Denies fecal/urinary incontinence, anal analgesia  Physical Exam:  Vitals:   07/10/18 1513  BP: 102/63  Pulse: 69  Temp: 98.1 F (36.7 C)  TempSrc: Oral  SpO2: 98%  Weight: 139 lb 1.6 oz (63.1 kg)  Height: 5\' 5"  (1.651 m)   Physical Exam  Constitutional: He appears well-developed and well-nourished. No distress.  HENT:  Head: Normocephalic and atraumatic.  Cardiovascular: Normal rate, regular rhythm and normal heart sounds.  Respiratory: Effort normal and breath sounds normal. No respiratory distress. He has no wheezes.  GI: Soft. Bowel sounds are normal. He exhibits no distension. There is no tenderness.  Musculoskeletal: He exhibits no edema.  5/5 muscle strength in bilateral lower extremities. No tenderness with straight leg test, heel walking, toes walking. Tenderness to palpation over right flank region  Skin: He is not diaphoretic. No erythema.  Psychiatric: He has a normal mood and affect. His behavior is normal. Thought content normal.    Assessment & Plan:   See Encounters Tab for problem based charting.  Patient discussed with Dr. Sandre Kittyaines

## 2018-07-10 NOTE — Patient Instructions (Signed)
It was a pleasure to see you today Mr. Shawn Leach. I am sorry to hear about your back pain, it is likely due to a muscle spasm. Please use heat and flexeril (muscle relaxer) that I have prescribed for you.   If you have any questions or concerns, please call our clinic at [phone] between 9am-5pm and after hours call [phone] and ask for the internal medicine resident on call. If you feel you are having a medical emergency please call 911.   Thank you, we look forward to help you remain healthy!  Lorenso CourierVahini Hammad Finkler, MD Internal Medicine PGY2

## 2018-07-20 NOTE — Progress Notes (Signed)
Internal Medicine Clinic Attending  Case discussed with Dr. Chundi at the time of the visit.  We reviewed the resident's history and exam and pertinent patient test results.  I agree with the assessment, diagnosis, and plan of care documented in the resident's note.  Alexander Raines, M.D., Ph.D.  

## 2018-07-31 IMAGING — DX DG FINGER THUMB 2+V*L*
3 series · 3 of 3 positions shown · non-contrast
Comparison: None.

CLINICAL DATA: Blunt trauma to the distal thumb with a pair of
pliers, initial encounter

EXAM:
LEFT THUMB 2+V

[finger ap]
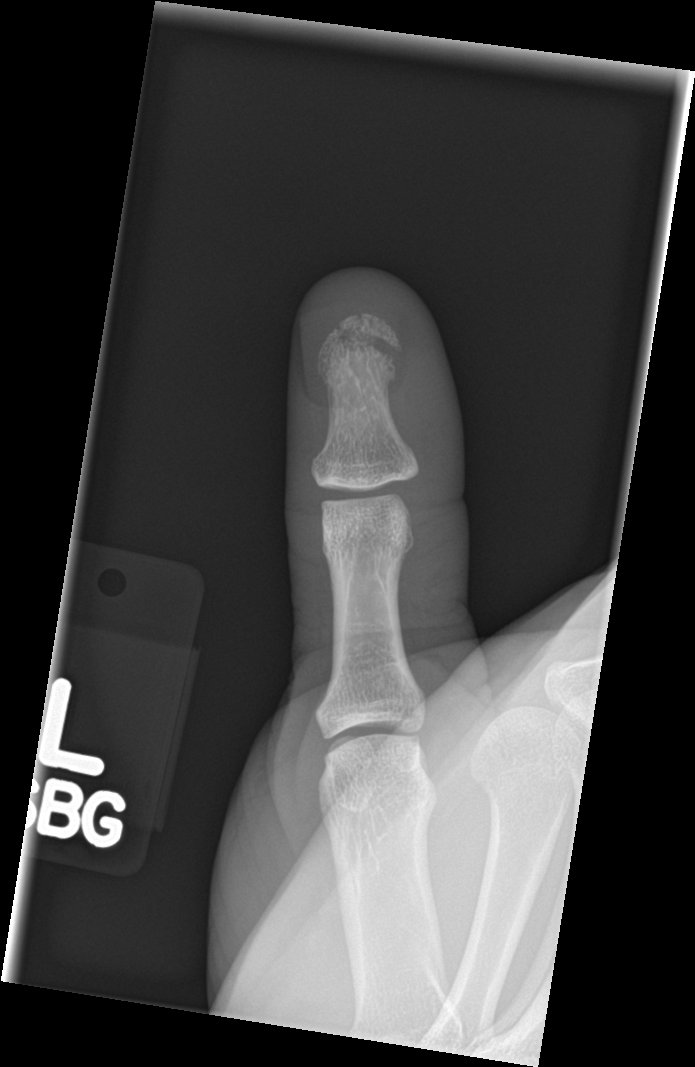

[finger obl]
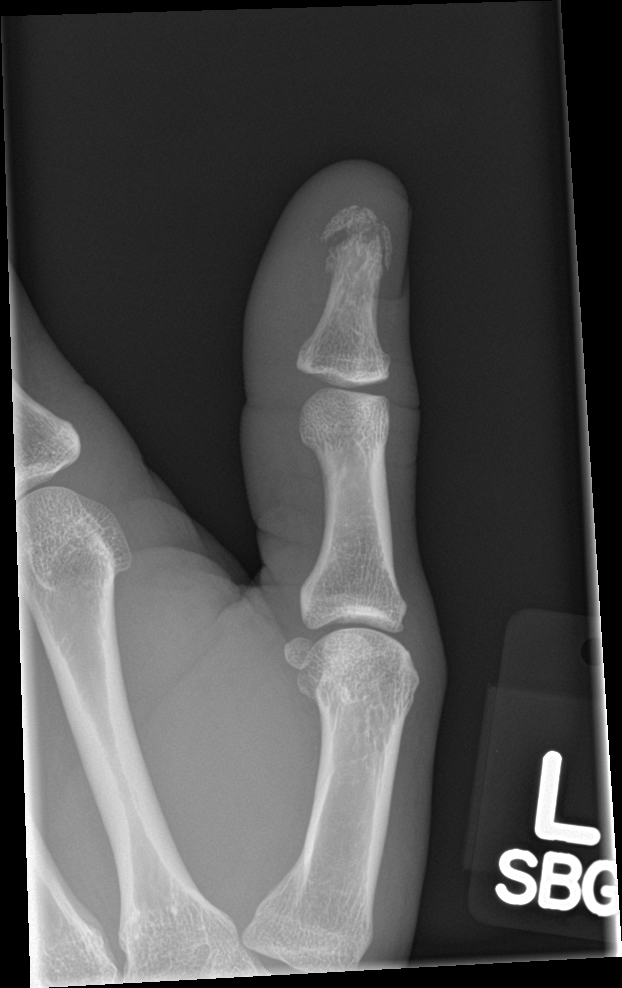

[finger lat]
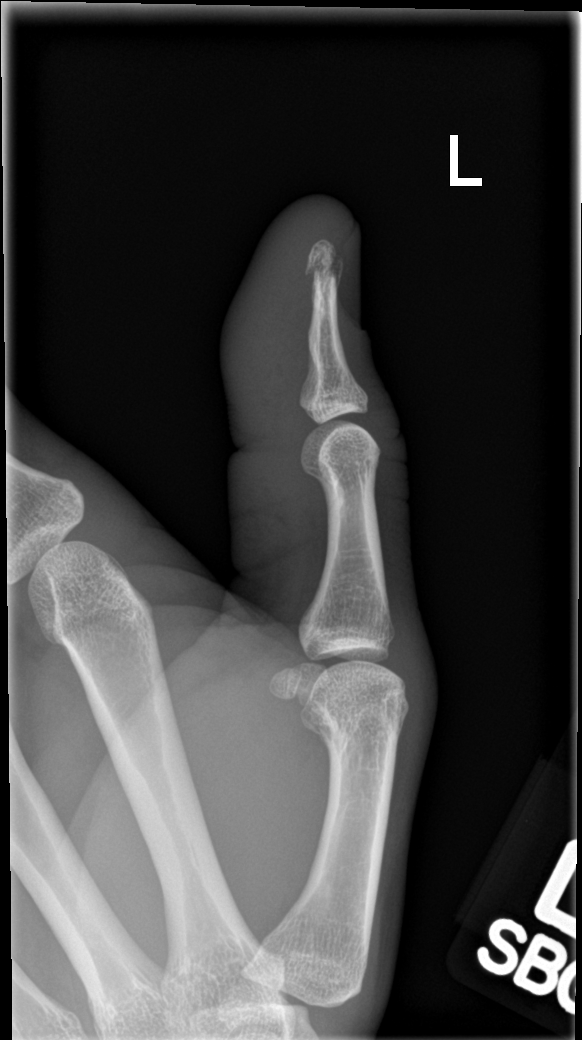

[3 of 3 positions shown; findings below may reference images not displayed]

FINDINGS: Distal phalangeal tuft fracture is noted in the first digit. Some
comminution is noted. Soft tissue swelling is seen as well.
IMPRESSION: Comminuted first distal phalangeal tuft fracture

## 2018-08-14 ENCOUNTER — Ambulatory Visit (INDEPENDENT_AMBULATORY_CARE_PROVIDER_SITE_OTHER): Payer: Medicaid Other | Admitting: Psychiatry

## 2018-08-14 DIAGNOSIS — F1021 Alcohol dependence, in remission: Secondary | ICD-10-CM

## 2018-08-14 DIAGNOSIS — Z79899 Other long term (current) drug therapy: Secondary | ICD-10-CM | POA: Diagnosis not present

## 2018-08-14 DIAGNOSIS — F5104 Psychophysiologic insomnia: Secondary | ICD-10-CM

## 2018-08-14 DIAGNOSIS — F250 Schizoaffective disorder, bipolar type: Secondary | ICD-10-CM

## 2018-08-14 MED ORDER — DIVALPROEX SODIUM ER 500 MG PO TB24: 1000.0000 mg | ORAL_TABLET | Freq: Every day | ORAL | 0 refills | Status: DC

## 2018-08-14 MED ORDER — TRAZODONE HCL 100 MG PO TABS: 100.0000 mg | ORAL_TABLET | Freq: Every day | ORAL | 0 refills | Status: DC

## 2018-08-14 NOTE — Progress Notes (Signed)
BH MD/PA/NP OP Progress Note  08/14/2018 9:51 AM Shawn Leach  MRN:  [id number]  Chief Complaint: I am doing fine.  Some nights I do not sleep well.  HPI: Patient is a 37 year old African-American man who came for his appointment with his girlfriend.  He is a patient of Dr.Eskir who left the practice.  Patient has been diagnosed with bipolar disorder, schizophrenia and alcohol abuse.  He has been taking Depakote thousand milligrams at bedtime and trazodone as needed.  He reported that medicine helping his mood, irritability, mania, psychosis and anger.  However he admitted there are nights when he does not sleep well because of his children.  He has 4 children who are age 76, 26, 21 and 77.  He claims to be sober from drinking for more than 2 years.  He sometimes smoke marijuana but lately he has not done.  Patient is on disability due to his mental disorder.  He denies any recent paranoia, hallucination or any suicidal thoughts.  He tried to spend time with his children which he believe very refreshing.  He claims he is seeing his primary care physician regularly and there has been no recent changes in his medication.  I reviewed his last blood work which was done and December 2018.  His Depakote level was below therapeutic.  But he claims that has been taking the medication as prescribed.  He has no tremors, shakes or any EPS.  His energy level is fair.  His appetite is okay.  Denies any feeling of hopelessness or worthlessness.  He likes to continue his current psychiatric medication.  Visit Diagnosis:    ICD-10-CM   1. Encounter for long-term (current) use of medications Z79.899 CBC with Differential/Platelet    Hemoglobin A1c    COMPLETE METABOLIC PANEL WITH GFR    Valproic acid level  2. Schizoaffective disorder, bipolar type (HCC) F25.0 divalproex (DEPAKOTE ER) 500 MG 24 hr tablet  3. Psychophysiological insomnia F51.04 traZODone (DESYREL) 100 MG tablet    Past Psychiatric History:  Reviewed Patient remember history of psychiatric illness since he was very young.  He reported history of suicidal thoughts, severe anger, mania and hallucination but he has not experienced these symptoms since he is been taking the medication.  He is to see psychiatrist at the local mental health agency which was close.  He has a 1 brief stay in the emergency room when he was intoxicated with alcohol.  In the past he has taken Seroquel which make him very groggy and tired.  Patient denies any history of suicidal attempt.  He denies any history of nightmares flashback or any OCD symptoms.  Past Medical History:  Past Medical History:  Diagnosis Date  . Bipolar 1 disorder (HCC)   . Bradycardia    asymptomatic, normal EKG and BMET  . Gonococcal urethritis    hx in 5/03  . High risk homosexual behavior    Hx of relationship with man 2002-2005, no known disease in the partnet  . History of psychiatric disorder    unknown prior diagnosis  . Schizophrenia (HCC)   . Shoulder weakness 02/23/2018  . Tympanic membrane rupture    history of   No past surgical history on file.  Family Psychiatric History: Reviewed  Family History: No family history on file.  Social History:  Social History   Socioeconomic History  . Marital status: Single    Spouse name: Not on file  . Number of children: Not on file  .  Years of education: Not on file  . Highest education level: Not on file  Occupational History  . Not on file  Social Needs  . Financial resource strain: Not on file  . Food insecurity:    Worry: Not on file    Inability: Not on file  . Transportation needs:    Medical: Not on file    Non-medical: Not on file  Tobacco Use  . Smoking status: Former Smoker    Types: Cigars    Last attempt to quit: 01/14/2011    Years since quitting: 7.5  . Smokeless tobacco: Never Used  . Tobacco comment: Black and Milds  Substance and Sexual Activity  . Alcohol use: Yes    Alcohol/week: 1.0  standard drinks    Types: 1 Cans of beer per week    Comment: daily  . Drug use: Yes    Frequency: 2.0 times per week    Types: Marijuana  . Sexual activity: Yes    Partners: Female    Comment: disabled on SSI, single, has a girlfriend (now only heterosexual) and 2 daughters,   Lifestyle  . Physical activity:    Days per week: Not on file    Minutes per session: Not on file  . Stress: Not on file  Relationships  . Social connections:    Talks on phone: Not on file    Gets together: Not on file    Attends religious service: Not on file    Active member of club or organization: Not on file    Attends meetings of clubs or organizations: Not on file    Relationship status: Not on file  Other Topics Concern  . Not on file  Social History Narrative  . Not on file    Allergies: No Known Allergies  Metabolic Disorder Labs: Lab Results  Component Value Date   HGBA1C 5.8 07/28/2009   No results found for: PROLACTIN Lab Results  Component Value Date   CHOL 199 02/21/2017   TRIG 247 (H) 02/21/2017   HDL 72 02/21/2017   CHOLHDL 2.8 02/21/2017   LDLCALC 78 02/21/2017   No results found for: TSH  Therapeutic Level Labs: No results found for: LITHIUM Lab Results  Component Value Date   VALPROATE <4 (L) 11/04/2017   VALPROATE <12.5 (L) 02/26/2015   No components found for:  CBMZ  Current Medications: Current Outpatient Medications  Medication Sig Dispense Refill  . divalproex (DEPAKOTE ER) 500 MG 24 hr tablet Take 2 tablets (1,000 mg total) by mouth daily. 180 tablet 1  . traZODone (DESYREL) 100 MG tablet Take 1 tablet (100 mg total) by mouth at bedtime. 90 tablet 1   No current facility-administered medications for this visit.      Musculoskeletal: Strength & Muscle Tone: within normal limits Gait & Station: normal Patient leans: N/A  Psychiatric Specialty Exam: Review of Systems  Constitutional: Negative.   HENT: Negative.   Eyes: Negative.   Respiratory:  Negative.   Cardiovascular: Negative.   Genitourinary: Negative.   Musculoskeletal: Negative.   Skin: Negative.   Neurological: Negative.   Endo/Heme/Allergies: Negative.     There were no vitals taken for this visit.There is no height or weight on file to calculate BMI.  General Appearance: Fairly Groomed  Eye Contact:  Fair  Speech:  Slow  Volume:  Decreased  Mood:  Euthymic  Affect:  Congruent  Thought Process:  Descriptions of Associations: Intact  Orientation:  Full (Time, Place, and Person)  Thought  Content: Logical   Suicidal Thoughts:  No  Homicidal Thoughts:  No  Memory:  Immediate;   Fair Recent;   Good Remote;   Good  Judgement:  Good  Insight:  Good  Psychomotor Activity:  Normal  Concentration:  Concentration: Good and Attention Span: Good  Recall:  Fair  Fund of Knowledge: Good  Language: Good  Akathisia:  No  Handed:  Right  AIMS (if indicated): not done  Assets:  Communication Skills Desire for Improvement Housing Social Support  ADL's:  Intact  Cognition: WNL  Sleep:  Fair   Screenings: PHQ2-9     Office Visit from 07/10/2018 in [facility] Office Visit from 08/29/2017 in [facility] Office Visit from 06/03/2017 in [facility] Office Visit from 02/21/2017 in [facility] Office Visit from 08/05/2016 in [facility]  PHQ-2 Total Score  0  0  0  2  0  PHQ-9 Total Score  -  -  -  8  -       Assessment and Plan: Schizoaffective disorder bipolar type.  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  I reviewed records from other providers, blood work results.  Patient is a stable on his current medication.  However his Depakote level is very low.  He is been seeing therapist in this office however he has not came to his appointment in a while.  I encourage to reschedule appointment with [facility].  We will do blood work including Depakote  level however patient already had Depakote this morning.  I recommended he can have a blood work including Depakote level he comes to see his therapist.  I discussed in detail about medication side effects, long-term efficacy and prognosis.  He had been given ample amount of time to ask any questions.  I will see him again in 6 months.  Blood work for CBC, CMP, hemoglobin A 1C and Depakote is ordered.  Time spent more than 25 minutes.  Greater than 50% of the time spent in psychoeducation, counseling, coordination of care and reviewing his chart.   Cleotis Nipper, MD 08/14/2018, 9:51 AM

## 2018-09-26 ENCOUNTER — Other Ambulatory Visit (HOSPITAL_COMMUNITY): Payer: Self-pay

## 2018-09-26 ENCOUNTER — Ambulatory Visit (INDEPENDENT_AMBULATORY_CARE_PROVIDER_SITE_OTHER): Payer: Medicaid Other | Admitting: Licensed Clinical Social Worker

## 2018-09-26 ENCOUNTER — Encounter (HOSPITAL_COMMUNITY): Payer: Self-pay | Admitting: Licensed Clinical Social Worker

## 2018-09-26 DIAGNOSIS — F250 Schizoaffective disorder, bipolar type: Secondary | ICD-10-CM | POA: Diagnosis not present

## 2018-09-26 DIAGNOSIS — Z79899 Other long term (current) drug therapy: Secondary | ICD-10-CM

## 2018-09-26 NOTE — Progress Notes (Signed)
   THERAPIST PROGRESS NOTE  Session Time: 11-12  Participation Level: Active  Behavioral Response: CasualAlertEuthymic  Type of Therapy: Individual Therapy  Treatment Goals addressed: Coping  Interventions: CBT  Summary: Shawn Leach is a 37 y.o. male who presents for therapy encounter for long-term schizoaffective disorder.   "I've been good. I take my Depakote everyday."  Pt reports he is feeling positive, hopeful, and stable w/ his mood. He denies any depression, anger outburst, or SI/HI. He reports his relationship w/ his wife is good and denies any significant  miscommunication problems. Pt discusses his plan to move into a new house before Feb 2020.  Suicidal/Homicidal: Nowithout intent/plan  Therapist Response: Counselor used open questions, active listening, and support. Counselor assessed pt's level of functioning and medication compliance.   Plan: Return again in 2-3 months.  Diagnosis:    ICD-10-CM   1. Schizoaffective disorder, bipolar type (HCC) F25.0   .    Margo Common, LCAS-A 09/26/2018

## 2018-09-28 LAB — COMPREHENSIVE METABOLIC PANEL
A/G RATIO: 1.4 (ref 1.2–2.2)
ALBUMIN: 4.2 g/dL (ref 3.5–5.5)
ALT: 18 IU/L (ref 0–44)
AST: 30 IU/L (ref 0–40)
Alkaline Phosphatase: 47 IU/L (ref 39–117)
BUN / CREAT RATIO: 11 (ref 9–20)
BUN: 11 mg/dL (ref 6–20)
Bilirubin Total: <0.2 mg/dL (ref 0.0–1.2)
CO2: 24 mmol/L (ref 20–29)
Calcium: 9.3 mg/dL (ref 8.7–10.2)
Chloride: 101 mmol/L (ref 96–106)
Creatinine, Ser: 1 mg/dL (ref 0.76–1.27)
GFR, EST AFRICAN AMERICAN: 112 mL/min/{1.73_m2} (ref >59)
GFR, EST NON AFRICAN AMERICAN: 97 mL/min/{1.73_m2} (ref >59)
GLOBULIN, TOTAL: 3 g/dL (ref 1.5–4.5)
Glucose: 71 mg/dL (ref 65–99)
POTASSIUM: 4.5 mmol/L (ref 3.5–5.2)
Sodium: 140 mmol/L (ref 134–144)
TOTAL PROTEIN: 7.2 g/dL (ref 6.0–8.5)

## 2018-09-28 LAB — HEMOGLOBIN A1C
Est. average glucose Bld gHb Est-mCnc: 123 mg/dL
HEMOGLOBIN A1C: 5.9 % — AB (ref 4.8–5.6)

## 2018-09-28 LAB — CBC WITH DIFFERENTIAL/PLATELET
BASOS: 1 %
Basophils Absolute: 0.1 10*3/uL (ref 0.0–0.2)
EOS (ABSOLUTE): 0.6 10*3/uL — AB (ref 0.0–0.4)
Eos: 10 %
HEMOGLOBIN: 14.4 g/dL (ref 13.0–17.7)
Hematocrit: 44.8 % (ref 37.5–51.0)
IMMATURE GRANS (ABS): 0 10*3/uL (ref 0.0–0.1)
Immature Granulocytes: 0 %
LYMPHS: 42 %
Lymphocytes Absolute: 2.6 10*3/uL (ref 0.7–3.1)
MCH: 28.9 pg (ref 26.6–33.0)
MCHC: 32.1 g/dL (ref 31.5–35.7)
MCV: 90 fL (ref 79–97)
Monocytes Absolute: 0.5 10*3/uL (ref 0.1–0.9)
Monocytes: 8 %
NEUTROS ABS: 2.4 10*3/uL (ref 1.4–7.0)
Neutrophils: 39 %
PLATELETS: 217 10*3/uL (ref 150–450)
RBC: 4.99 x10E6/uL (ref 4.14–5.80)
RDW: 13.1 % (ref 12.3–15.4)
WBC: 6.2 10*3/uL (ref 3.4–10.8)

## 2018-09-28 LAB — VALPROIC ACID LEVEL: VALPROIC ACID LVL: 75 ug/mL (ref 50–100)

## 2019-01-30 ENCOUNTER — Other Ambulatory Visit: Payer: Self-pay

## 2019-01-30 ENCOUNTER — Ambulatory Visit (INDEPENDENT_AMBULATORY_CARE_PROVIDER_SITE_OTHER): Payer: Medicaid Other | Admitting: Licensed Clinical Social Worker

## 2019-01-30 DIAGNOSIS — F250 Schizoaffective disorder, bipolar type: Secondary | ICD-10-CM

## 2019-02-02 ENCOUNTER — Encounter (HOSPITAL_COMMUNITY): Payer: Self-pay | Admitting: Licensed Clinical Social Worker

## 2019-02-02 NOTE — Progress Notes (Signed)
   THERAPIST PROGRESS NOTE  Session Time: 9:30-10:30Am  Participation Level: Active  Behavioral Response: Well GroomedAlertEuthymic  Type of Therapy: Individual Therapy  Treatment Goals addressed: Coping  Interventions: Strength-based and Supportive  Summary: Shawn Leach is a 38 y.o. male who presents with hx of Schizophrenia, Bipolar type. He is active, engaged, well groomed, and lucid in session. He presents w/ his 7yo son who "wanted to see what counseling is like". PT reports he is "feeling great", denies any sxs of depression, anxiety, SI, HI, hallucinations, or delusions. He states he continues to take Depakote but has not taken Trazadone in "a few months" due to "not needing it to sleep anymore". PT reports good sleep. He laughs often in session. He denies any issues w/ mood, irritability, or anger towards others.    Suicidal/Homicidal: Nowithout intent/plan  Therapist Response: Counselor used open questions, rapport building, active listening, and reflection. Counselor congratulated PT on his bx changes and his medication adherence. Counselor worked to encourage PT and support his ongoing progress.   Plan: Return again in 12 weeks. Continue medication. Appear for Medication management appointment w/ Dr. Lolly Mustache on 02/12/19.  Diagnosis:    ICD-10-CM   1. Schizoaffective disorder, bipolar type [facility]) F25.0       Margo Common, LCAS-A 02/02/2019

## 2019-02-12 ENCOUNTER — Encounter

## 2019-02-12 ENCOUNTER — Other Ambulatory Visit: Payer: Self-pay

## 2019-02-12 ENCOUNTER — Telehealth (INDEPENDENT_AMBULATORY_CARE_PROVIDER_SITE_OTHER): Payer: Medicaid Other | Admitting: Psychiatry

## 2019-02-12 DIAGNOSIS — F250 Schizoaffective disorder, bipolar type: Secondary | ICD-10-CM | POA: Diagnosis not present

## 2019-02-12 DIAGNOSIS — F5104 Psychophysiologic insomnia: Secondary | ICD-10-CM

## 2019-02-12 MED ORDER — TRAZODONE HCL 50 MG PO TABS: 50.0000 mg | ORAL_TABLET | Freq: Every evening | ORAL | 0 refills | Status: DC | PRN

## 2019-02-12 MED ORDER — DIVALPROEX SODIUM ER 500 MG PO TB24: 1000.0000 mg | ORAL_TABLET | Freq: Every day | ORAL | 0 refills | Status: DC

## 2019-02-12 NOTE — Progress Notes (Signed)
Virtual Visit via Telephone Note  I connected with Shawn Leach on 02/12/19 at 10:00 AM EDT by telephone and verified that I am speaking with the correct person using two identifiers.   I discussed the limitations, risks, security and privacy concerns of performing an evaluation and management service by telephone and the availability of in person appointments. I also discussed with the patient that there may be a patient responsible charge related to this service. The patient expressed understanding and agreed to proceed.   History of Present Illness: Patient was evaluated through phone session.  He was last seen in September.  He has been taking Depakote thousand milligrams at bedtime.  He admitted not taking trazodone because it is making him very tired and sleepy.  He lives with his wife who also takes trazodone and he does not want both people sleeping very deep at night as there are 4 children who live in the house.  Is 38 and 36 year old not going to school due to pandemic virus and they are doing home schooling.  He admitted lately more stressed due to pandemic virus but he is having situation better than he anticipated.  He denies any severe mood swings, anger, aggression or any suicidal thoughts.  He likes Depakote.  We did blood work on his last visit and his Depakote level is 75.  His energy level is good.  He denies drinking but admitted sometimes use cannabis.  He has not lately used cannabis.  He admitted that there are sometimes when he cannot sleep and he has racing thoughts.  I discussed to consider taking low-dose trazodone if the 100 mg he can sleepy.  He agreed to give a try 50 mg to help his insomnia.  He did not recall any side effects.   Past Psychiatric History: Reviewed H/O psychiatric illness since young age.  H/O suicidal thoughts, severe anger, mania and hallucination. Saw psychiatrist at the local mental health agency which closed. H/O one brief stay in the emergency  room when intoxicated with alcohol. Took Seroquel (groggy and tired).  No h/o suicidal attempt, nightmares flashback or OCD symptoms.  Mental status examination: Limited mental stat examination done on the phone.  His speech is fast but clear and coherent.  His thought process logical and goal-directed.  There were no flight of ideas or any loose association.  He denies any suicidal thoughts homicidal thoughts or any paranoia.  He do not reported any tremors or shakes.  He does not appear to be distracted on the phone conversation.  He is alert and oriented x3.  Denies any hallucination or any grandiosity.  His fund of knowledge is average.  His insight and judgment is adequate.  Assessment and Plan: Schizoaffective disorder, bipolar type.  Psychophysiological insomnia.  Patient is stable on Depakote thousand milligrams at bedtime.  I reviewed blood work results.  His Depakote level is 75 and his CBC and basic chemistry is normal.  I discussed to resume trazodone to only 50 mg to help his insomnia as patient reported sometimes insomnia.  He endorsed medicine working and he is not involved in any agitation anger or any severe mood swings.  He is seeing Earnest Conroy for therapy.  I recommended to call us back if is any question or any concern.  I will see him again in 3 months.  Follow Up Instructions:    I discussed the assessment and treatment plan with the patient. The patient was provided an opportunity to ask questions  and all were answered. The patient agreed with the plan and demonstrated an understanding of the instructions.   The patient was advised to call back or seek an in-person evaluation if the symptoms worsen or if the condition fails to improve as anticipated.  I provided 15 minutes of non-face-to-face time during this encounter.   Shawn Nations, MD

## 2019-03-08 ENCOUNTER — Encounter: Payer: Self-pay | Admitting: Internal Medicine

## 2019-03-08 ENCOUNTER — Ambulatory Visit (INDEPENDENT_AMBULATORY_CARE_PROVIDER_SITE_OTHER): Payer: Self-pay | Admitting: Internal Medicine

## 2019-03-08 ENCOUNTER — Other Ambulatory Visit: Payer: Self-pay

## 2019-03-08 DIAGNOSIS — F250 Schizoaffective disorder, bipolar type: Secondary | ICD-10-CM

## 2019-03-08 DIAGNOSIS — M545 Low back pain, unspecified: Secondary | ICD-10-CM

## 2019-03-08 MED ORDER — CYCLOBENZAPRINE HCL 5 MG PO TABS: 5.0000 mg | ORAL_TABLET | Freq: Every day | ORAL | 0 refills | Status: DC | PRN

## 2019-03-08 NOTE — Progress Notes (Signed)
  This is a telephone encounter between [employer] and Shawn Leach on 03/08/2019. The visit was conducted with the patient located at home and Shawn Leach at [facility]. The patient's identity was confirmed using their DOB and current address. The patient has consented to being evaluated through a telephone encounter and understands the associated risks (an examination cannot be done and the patient may need to come in for an appointment) / benefits (allows the patient to remain at home, decreasing exposure to coronavirus). I personally spent 6 minutes on medical discussion.   CC: Schizoaffective disorder Bipolar type, Low back pain  HPI:  [name] is a 38 y.o. M with PMHx listed below presenting for Schizoaffective disorder Bipolar type, Low back pain. Please see the A&P for the status of the patient's chronic medical problems.  Past Medical History:  Diagnosis Date  . Bipolar 1 disorder (HCC)   . Bradycardia    asymptomatic, normal EKG and BMET  . Gonococcal urethritis    hx in 5/03  . High risk homosexual behavior    Hx of relationship with man 2002-2005, no known disease in the partnet  . History of psychiatric disorder    unknown prior diagnosis  . Schizophrenia (HCC)   . Shoulder weakness 02/23/2018  . Tympanic membrane rupture    history of   Review of Systems: Performed and all others negative.  Physical Exam:   There were no vitals filed for this visit. Not performed for tele-health visit  Assessment & Plan:   See Encounters Tab for problem based charting.  Patient discussed with Dr. Heide Spark

## 2019-03-08 NOTE — Assessment & Plan Note (Signed)
Patient states he has been taking his medications as prescribed, Depakote and Trazodone. He states he continues to do well on his medications. He has been able to have telephone visits with Mental Health during the Pandemic. - Depakote 500mg  Daily - Trazodone 100mg  qhs - Follow up with Mental Health as scheduled

## 2019-03-08 NOTE — Patient Instructions (Signed)
Verbal instructions provided for tele-health visit 

## 2019-03-08 NOTE — Assessment & Plan Note (Signed)
Patient has continued to have intermittent low back pain, possibly triggered by certain movements per his report. He was wondering if he needs an X-ray. His pain sounds consistent with lumbar spasm based on his last visit and continued symptoms. It was explained that it is difficult to fully evaluate without a physical exam today, but based on the symptoms remaining similar to previous, intermittent nature of the pain, and apparently triggered by some movements, his symptoms remain consistent with a muscle spasm. He states the muscle relaxer prescribed at his last visit did help with the pain, so a refill will be provided. - Flexeril 5mg  Daily PRN - In person follow-up when Pandemic is waning if pain persists.

## 2019-03-09 NOTE — Progress Notes (Signed)
Internal Medicine Clinic Attending  Case discussed with Dr. Melvin  at the time of the visit.  We reviewed the resident's history and pertinent patient test results.  I agree with the assessment, diagnosis, and plan of care documented in the resident's note.  

## 2019-05-14 ENCOUNTER — Other Ambulatory Visit: Payer: Self-pay

## 2019-05-14 ENCOUNTER — Ambulatory Visit (INDEPENDENT_AMBULATORY_CARE_PROVIDER_SITE_OTHER): Payer: Medicaid Other | Admitting: Psychiatry

## 2019-05-14 ENCOUNTER — Encounter (HOSPITAL_COMMUNITY): Payer: Self-pay | Admitting: Psychiatry

## 2019-05-14 DIAGNOSIS — F5104 Psychophysiologic insomnia: Secondary | ICD-10-CM

## 2019-05-14 DIAGNOSIS — F250 Schizoaffective disorder, bipolar type: Secondary | ICD-10-CM | POA: Diagnosis not present

## 2019-05-14 MED ORDER — DIVALPROEX SODIUM ER 500 MG PO TB24: 1000.0000 mg | ORAL_TABLET | Freq: Every day | ORAL | 0 refills | Status: DC

## 2019-05-14 MED ORDER — TRAZODONE HCL 50 MG PO TABS: 50.0000 mg | ORAL_TABLET | Freq: Every evening | ORAL | 1 refills | Status: DC | PRN

## 2019-05-14 NOTE — Progress Notes (Signed)
Virtual Visit via Telephone Note  I connected with Shawn Leach on 05/14/19 at 11:40 AM EDT by telephone and verified that I am speaking with the correct person using two identifiers.   I discussed the limitations, risks, security and privacy concerns of performing an evaluation and management service by telephone and the availability of in person appointments. I also discussed with the patient that there may be a patient responsible charge related to this service. The patient expressed understanding and agreed to proceed.   History of Present Illness: Patient was evaluated by phone session.  He is taking trazodone since the last visit and is sleeping better.  Patient told it is much better than Seroquel because Seroquel was making him groggy and tired.  He still hear voices and sometimes talk to himself but denies any irritability, severe mood swings or any agitation.  He is on disability.  He lives with his wife and 4 children.  He admitted some anxiety due to COVID-19 as he does not leave his house unless it is important.  Lately he is complaining of back pain and he sought recently his physician at [place] and prescribed Flexeril.  Patient like to continue his current medication.  He admitted occasional drinking beer and smoke marijuana but since taking the medication Depakote he has cut down significantly.  Denies any suicidal thoughts or homicidal thought.  Denies any tremors or shakes.  His energy level is good.   Past Psychiatric History:Reviewed H/O psychiatric illness since young age. H/O suicidal thoughts, severe anger, mania and hallucination. Saw psychiatrist at the local mental health agency which closed. H/O one brief stay in the emergency room when intoxicated with alcohol. Took Seroquel (groggy and tired). No h/o suicidal attempt, nightmares flashback or OCD symptoms.   Psychiatric Specialty Exam: Physical Exam  ROS  There were no vitals taken for this  visit.There is no height or weight on file to calculate BMI.  General Appearance: NA  Eye Contact:  NA  Speech:  Clear and Coherent and fast  Volume:  Normal  Mood:  Anxious  Affect:  NA  Thought Process:  Descriptions of Associations: Circumstantial  Orientation:  Full (Time, Place, and Person)  Thought Content:  Paranoid Ideation  Suicidal Thoughts:  No  Homicidal Thoughts:  No  Memory:  Immediate;   Fair Recent;   Fair Remote;   Fair  Judgement:  Good  Insight:  Good  Psychomotor Activity:  NA  Concentration:  Concentration: Fair and Attention Span: Fair  Recall:  [employer] of Knowledge:  Fair  Language:  Fair  Akathisia:  No  Handed:  Right  AIMS (if indicated):     Assets:  Communication Skills Desire for Improvement Housing Resilience  ADL's:  Intact  Cognition:  WNL  Sleep:         Assessment and Plan: Schizoaffective disorder, bipolar type.  Psychological insomnia.  Patient is a stable on his medication.  Discussed not to smoke cannabis and drink alcohol due to interaction with his psychotropic medication.  Patient has cut down significantly since taking the medication.  He has not resume therapy with [facility] due to COVID-19.  He reported no tremors or shakes.  I will continue Depakote ER 1000 mg at bedtime and trazodone 50 mg as needed for insomnia.  Discussed medication side effects and benefits.  Recommended to call us back if is any question or any concern.  Follow-up in 3 months.  Follow Up Instructions:  I discussed the assessment and treatment plan with the patient. The patient was provided an opportunity to ask questions and all were answered. The patient agreed with the plan and demonstrated an understanding of the instructions.   The patient was advised to call back or seek an in-person evaluation if the symptoms worsen or if the condition fails to improve as anticipated.  I provided 15 minutes of non-face-to-face time during this  encounter.   Cleotis NipperSyed T Candee Hoon, MD

## 2019-06-07 ENCOUNTER — Other Ambulatory Visit: Payer: Self-pay

## 2019-06-07 ENCOUNTER — Encounter: Payer: Self-pay | Admitting: Internal Medicine

## 2019-06-07 ENCOUNTER — Ambulatory Visit: Payer: Medicaid Other | Admitting: Internal Medicine

## 2019-06-07 VITALS — BP 120/57 | HR 73 | Temp 98.5°F | Ht 65.0 in | Wt 130.9 lb

## 2019-06-07 DIAGNOSIS — G8929 Other chronic pain: Secondary | ICD-10-CM

## 2019-06-07 DIAGNOSIS — M545 Low back pain, unspecified: Secondary | ICD-10-CM

## 2019-06-07 DIAGNOSIS — Z791 Long term (current) use of non-steroidal anti-inflammatories (NSAID): Secondary | ICD-10-CM

## 2019-06-07 DIAGNOSIS — Z79899 Other long term (current) drug therapy: Secondary | ICD-10-CM

## 2019-06-07 MED ORDER — CYCLOBENZAPRINE HCL 7.5 MG PO TABS: 7.5000 mg | ORAL_TABLET | Freq: Three times a day (TID) | ORAL | 0 refills | Status: DC | PRN

## 2019-06-07 MED ORDER — MELOXICAM 15 MG PO TABS: 7.5000 mg | ORAL_TABLET | Freq: Every day | ORAL | 0 refills | Status: AC

## 2019-06-07 NOTE — Assessment & Plan Note (Signed)
Patient has continued to have low back pain since evaluated by phone in April. He has had partial relief with Flexeril, but this does not last. He states that his back will tightened up and hurt if he tries to bend over for too long. On exam, he has tight paraspinal musculature at bilateral thoracic and lumbar regions. He reports intermittent cramping that is chronic and asks about his electrolytes, lab work reviewed and shows normal electrolytes at all recent checks.We will continue with increased dose of Flexeril, short course of Mobic, and referral to physical therapy for strengthen ans stretching exercises. - Flexeril 7.5mg  TID PRN - Mobic 15mg  Daily x14d - Referral to physical therapy

## 2019-06-07 NOTE — Patient Instructions (Signed)
Thank you for allowing [place] to care for you  For your back pain - Continue with flexeril as needed - I have prescribed Mobic to take once a day for 14 days, then take Ibuprofen as needed - Referral place for physical therapy  Please follow up in about 6 months to 1 year

## 2019-06-07 NOTE — Progress Notes (Signed)
   CC: Back Pain  HPI:  [name] is a 38 y.o. M with PMHx listed below presenting for Back Pain. Please see the A&P for the status of the patient's chronic medical problems.  Past Medical History:  Diagnosis Date  . Bipolar 1 disorder ([place])   . Bradycardia    asymptomatic, normal EKG and BMET  . Gonococcal urethritis    hx in 5/03  . High risk homosexual behavior    Hx of relationship with man 2002-2005, no known disease in the partnet  . History of psychiatric disorder    unknown prior diagnosis  . Schizoaffective disorder, unspecified type ([place]) 03/16/2007   Diagnosed since childhood - teenage years Has been on medications including Depakote and Seroquel since then  Symptoms are stable but on disability due to this mental d/o  Late 2014: Fired from [employer] due to aggressive behavior 2015 Started attending [employer] [facility] in [place]     . Schizophrenia ([place])   . Shoulder weakness 02/23/2018  . Tympanic membrane rupture    history of   Review of Systems:  Performed and all others negative.  Physical Exam:  Vitals:   06/07/19 1325  BP: (!) 120/57  Pulse: 73  Temp: 98.5 F (36.9 C)  TempSrc: Oral  SpO2: 99%  Weight: 130 lb 14.4 oz (59.4 kg)   Physical Exam Constitutional:      General: He is not in acute distress.    Appearance: Normal appearance.  Cardiovascular:     Rate and Rhythm: Normal rate and regular rhythm.     Pulses: Normal pulses.     Heart sounds: Normal heart sounds.  Pulmonary:     Effort: Pulmonary effort is normal. No respiratory distress.     Breath sounds: Normal breath sounds.  Abdominal:     General: Bowel sounds are normal. There is no distension.     Palpations: Abdomen is soft.     Tenderness: There is no abdominal tenderness.  Musculoskeletal:        General: No swelling or deformity.     Comments: Para spinal musculature tight bilaterally in thoracic and lumbar regions with mild tenderness to palpation  Skin:    General: Skin is  warm and dry.  Neurological:     General: No focal deficit present.     Mental Status: Mental status is at baseline.    Assessment & Plan:   See Encounters Tab for problem based charting.  Patient discussed with Dr. Dareen Piano

## 2019-06-11 NOTE — Progress Notes (Signed)
Internal Medicine Clinic Attending  Case discussed with Dr. Melvin  at the time of the visit.  We reviewed the resident's history and exam and pertinent patient test results.  I agree with the assessment, diagnosis, and plan of care documented in the resident's note.  

## 2019-06-19 ENCOUNTER — Encounter: Payer: Self-pay | Admitting: Physical Therapy

## 2019-06-19 ENCOUNTER — Ambulatory Visit
Payer: Medicaid Other | Attending: Student in an Organized Health Care Education/Training Program | Admitting: Physical Therapy

## 2019-06-19 ENCOUNTER — Other Ambulatory Visit: Payer: Self-pay

## 2019-06-19 DIAGNOSIS — R262 Difficulty in walking, not elsewhere classified: Secondary | ICD-10-CM | POA: Diagnosis present

## 2019-06-19 DIAGNOSIS — G8929 Other chronic pain: Secondary | ICD-10-CM | POA: Insufficient documentation

## 2019-06-19 DIAGNOSIS — M545 Low back pain: Secondary | ICD-10-CM | POA: Insufficient documentation

## 2019-06-19 NOTE — Therapy (Signed)
[facility] [address] [place], [place], [address] Phone: [phone]   Fax:  [phone]  Physical Therapy Evaluation  Patient Details  Name: Shawn Leach MRN: [id number] Date of Birth: 09-28-1981 Referring Provider (PT): Tyson AliasVincent, Duncan Thomas, MD   Encounter Date: 06/19/2019  PT End of Session - 06/19/19 0943    Visit Number  1    Authorization Type  MCD- submitted 8/4    PT Start Time  0936    PT Stop Time  1005    PT Time Calculation (min)  29 min    Activity Tolerance  Patient tolerated treatment well    Behavior During Therapy  [facility] for tasks assessed/performed       Past Medical History:  Diagnosis Date  . Bipolar 1 disorder (HCC)   . Bradycardia    asymptomatic, normal EKG and BMET  . Gonococcal urethritis    hx in 5/03  . High risk homosexual behavior    Hx of relationship with man 2002-2005, no known disease in the partnet  . History of psychiatric disorder    unknown prior diagnosis  . Schizoaffective disorder, unspecified type (HCC) 03/16/2007   Diagnosed since childhood - teenage years Has been on medications including Depakote and Seroquel since then  Symptoms are stable but on disability due to this mental d/o  Late 2014: Fired from [employer] due to aggressive behavior 2015 Started attending [facility] in [place]     . Schizophrenia (HCC)   . Shoulder weakness 02/23/2018  . Tympanic membrane rupture    history of    History reviewed. No pertinent surgical history.  There were no vitals filed for this visit.   Subjective Assessment - 06/19/19 0938    Subjective  I pulled my back out a long time ago lifting a chicken (about a year ago). I have been taking muscle relaxers. It goes up to my left arm and it gives up. I had to walk my right leg out one time becuase it was not getting any blood flow. Back will get tight and I have to let it relax. Can only bend over 10-15 min. I feel a "ball" sensation  (pointing to Rt lower rib cage on ventral side)- unable to create hernia and asked him to keep an eye on it.    How long can you walk comfortably?  can move around about 9 hours    Patient Stated Goals  cleaning up, bend forward, lifting    Currently in Pain?  No/denies    Aggravating Factors   standing, walking bending for long periods    Pain Relieving Factors  rest         [facility] - 06/19/19 0001      Assessment   Medical Diagnosis  low back pain    Referring Provider (PT)  Tyson AliasVincent, Duncan Thomas, MD    Onset Date/Surgical Date  --   one year ago, 06/15/2018   Hand Dominance  Right    Next MD Visit  --   3 months   Prior Therapy  no      Precautions   Precautions  None      Restrictions   Weight Bearing Restrictions  No      Balance Screen   Has the patient fallen in the past 6 months  No      Home Environment   Living Environment  Private residence    Additional Comments  stars outside of home  Prior Function   Level of Independence  Independent    Vocation  On disability      Cognition   Overall Cognitive Status  Within Functional Limits for tasks assessed      Sensation   Additional Comments  reports occasional changes in sensation in LE but unspecific      Posture/Postural Control   Posture Comments  slouched posture with rounded shoulders and forward head      ROM / Strength   AROM / PROM / Strength  Strength      Strength   Strength Assessment Site  Hip    Right/Left Hip  Right;Left    Right Hip Flexion  4/5    Right Hip Extension  4+/5    Right Hip ABduction  4+/5    Left Hip Flexion  5/5    Left Hip Extension  5/5    Left Hip ABduction  4+/5      Palpation   Spinal mobility  limited thoracic and lumbar    Palpation comment  TTP bil thoraco lumbar paraspinals                Objective measurements completed on examination: See above findings.      [facility] PT Treatment/Exercise - 06/19/19 0001      Exercises    Exercises  Lumbar      Lumbar Exercises: Stretches   Passive Hamstring Stretch Limitations  seated in chair    Quadruped Mid Back Stretch Limitations  child pose    Quad Stretch Limitations  prone reach back for foot    Gastroc Stretch Limitations  standing at wall      Lumbar Exercises: Seated   Other Seated Lumbar Exercises  horizontal abduction red tband             PT Education - 06/19/19 1012    Education Details  anatomy of condition, POC, HEP, exercise form/rataionale    Person(s) Educated  Patient    Methods  Explanation;Tactile cues;Demonstration;Verbal cues;Handout    Comprehension  Verbalized understanding;Returned demonstration;Verbal cues required;Tactile cues required;Need further instruction       PT Short Term Goals - 06/19/19 1014      PT SHORT TERM GOAL #1   Title  Pt will have understanding to utilize stretches throughout the day to decrease spasm/tightness    Baseline  began educating at eval    Time  3    Period  Weeks    Status  New    Target Date  07/13/19      PT SHORT TERM GOAL #2   Title  LE strength to 5/5    Baseline  see flowsheet    Time  3    Period  Weeks    Status  New    Target Date  07/13/19      PT SHORT TERM GOAL #3   Title  Pt will be able to bend forward to lift light <=10lb comfortably    Baseline  unable at eval    Time  3    Period  Weeks    Status  New    Target Date  07/13/19        PT Long Term Goals - 06/19/19 1016      PT LONG TERM GOAL #1   Title  to be set PRN at [place] - 06/19/19 1005    Clinical Impression Statement  Pt presents to PT with complaints of chronic back pain. This is his first round of PT as muscle relaxers have not provided long term relief. Pt is on disability and not working at this time. Mild weakness noted in proximal musculature on Rt side. Pt also rests with poor posture contributing to LBP. Limited flexibility through LEs.Pt will benefit from skilled PT in  order to correct deficits and meet fuctional goals.    Personal Factors and Comorbidities  Time since onset of injury/illness/exacerbation;Comorbidity 1    Comorbidities  psychiatric disorder    Examination-Activity Limitations  Bend;Sit;Carry;Sleep;Squat;Stairs;Stand;Lift;Reach Overhead    Examination-Participation Restrictions  Meal Prep;Cleaning;Community Activity    Stability/Clinical Decision Making  Stable/Uncomplicated    Clinical Decision Making  Low    Rehab Potential  Good    PT Frequency  1x / week    PT Duration  3 weeks   1/week 3 weeks per first MCD auth   PT Treatment/Interventions  ADLs/Self Care Home Management;Cryotherapy;Electrical Stimulation;Traction;Moist Heat;Functional mobility training;Therapeutic activities;Therapeutic exercise;Patient/family education;Neuromuscular re-education;Manual techniques;Passive range of motion;Dry needling;Spinal Manipulations;Taping;Joint Manipulations    PT Next Visit Plan  progress stretching program, core strengthening, review weight lifting for proper postural strength    PT Home Exercise Plan  child pose, hamstring stretch, quad stretch, gastroc stretch; UE horiz abd    Consulted and Agree with Plan of Care  Patient       Patient will benefit from skilled therapeutic intervention in order to improve the following deficits and impairments:  Difficulty walking, Impaired UE functional use, Increased muscle spasms, Decreased activity tolerance, Pain, Improper body mechanics, Impaired flexibility, Hypomobility, Decreased strength, Postural dysfunction  Visit Diagnosis: 1. Chronic bilateral low back pain without sciatica   2. Difficulty in walking, not elsewhere classified        Problem List Patient Active Problem List   Diagnosis Date Noted  . Back pain 06/05/2017  . Encounter for immunization 07/08/2016  . Adjustment disorder with emotional disturbance 06/28/2014  . Health care maintenance 01/03/2013  . Alcohol use 01/03/2013   . Schizoaffective disorder, bipolar type (HCC) 03/16/2007    Siyona Coto C. Jazir Newey PT, DPT 06/19/19 10:21 AM   [facility] [address] [place], [place], [address] Phone: [phone]   Fax:  [phone]  Name: Shawn FreesRawmanne H Goering MRN: [id number] Date of Birth: Apr 05, 1981

## 2019-06-20 ENCOUNTER — Other Ambulatory Visit: Payer: Self-pay

## 2019-06-20 DIAGNOSIS — Z20822 Contact with and (suspected) exposure to covid-19: Secondary | ICD-10-CM

## 2019-06-22 LAB — NOVEL CORONAVIRUS, NAA: SARS-CoV-2, NAA: NOT DETECTED

## 2019-06-25 ENCOUNTER — Other Ambulatory Visit: Payer: Self-pay

## 2019-06-25 ENCOUNTER — Encounter: Payer: Self-pay | Admitting: Physical Therapy

## 2019-06-25 ENCOUNTER — Ambulatory Visit: Payer: Medicaid Other | Admitting: Physical Therapy

## 2019-06-25 DIAGNOSIS — R262 Difficulty in walking, not elsewhere classified: Secondary | ICD-10-CM

## 2019-06-25 DIAGNOSIS — M545 Low back pain, unspecified: Secondary | ICD-10-CM

## 2019-06-25 DIAGNOSIS — G8929 Other chronic pain: Secondary | ICD-10-CM

## 2019-06-25 NOTE — Therapy (Signed)
[facility] [address] [place], [place], [address] Phone: [phone]840   Fax:  [phone]  Physical Therapy Treatment  Patient Details  Name: Shawn Leach MRN: [id number] Date of Birth: [birth year] Referring Provider (PT): Tyson AliasVincent, Duncan Thomas, MD   Encounter Date: 06/25/2019  PT End of Session - 06/25/19 0921    Visit Number  2    Authorization Type  MCD    Authorization Time Period  8/9-8/29    Authorization - Visit Number  1    Authorization - Number of Visits  3    PT Start Time  0920    PT Stop Time  0958    PT Time Calculation (min)  38 min    Activity Tolerance  Patient tolerated treatment well    Behavior During Therapy  [facility] for tasks assessed/performed       Past Medical History:  Diagnosis Date  . Bipolar 1 disorder (HCC)   . Bradycardia    asymptomatic, normal EKG and BMET  . Gonococcal urethritis    hx in 5/03  . High risk homosexual behavior    Hx of relationship with man 2002-2005, no known disease in the partnet  . History of psychiatric disorder    unknown prior diagnosis  . Schizoaffective disorder, unspecified type (HCC) 03/16/2007   Diagnosed since childhood - teenage years Has been on medications including Depakote and Seroquel since then  Symptoms are stable but on disability due to this mental d/o  Late 2014: Fired from [employer] due to aggressive behavior 2015 Started attending [facility] in [place]     . Schizophrenia (HCC)   . Shoulder weakness 02/23/2018  . Tympanic membrane rupture    history of    History reviewed. No pertinent surgical history.  There were no vitals filed for this visit.  Subjective Assessment - 06/25/19 0921    Subjective  Feeling better, no pain right now. denies problems with "ball" on right side    Currently in Pain?  No/denies                       [facility] Treatment/Exercise - 06/25/19 0001      Exercises   Exercises  Other  Exercises    Other Exercises   home weight lifting-       Lumbar Exercises: Stretches   Passive Hamstring Stretch Limitations  seated with strap below foot    Quad Stretch Limitations  standing grab foot      Lumbar Exercises: Aerobic   Nustep  5 min L:8 UE & LE      Lumbar Exercises: Standing   Other Standing Lumbar Exercises  hip hinge with yard stick - seated & standing    Other Standing Lumbar Exercises  hip hinge with yard stick +squat to tap table      Lumbar Exercises: Prone   Single Arm Raises Limitations  row 3lb    Opposite Arm/Leg Raise Limitations  bird dog               PT Short Term Goals - 06/19/19 1014      PT SHORT TERM GOAL #1   Title  Pt will have understanding to utilize stretches throughout the day to decrease spasm/tightness    Baseline  began educating at eval    Time  3    Period  Weeks    Status  New    Target Date  07/13/19  PT SHORT TERM GOAL #2   Title  LE strength to 5/5    Baseline  see flowsheet    Time  3    Period  Weeks    Status  New    Target Date  07/13/19      PT SHORT TERM GOAL #3   Title  Pt will be able to bend forward to lift light <=10lb comfortably    Baseline  unable at eval    Time  3    Period  Weeks    Status  New    Target Date  07/13/19        PT Long Term Goals - 06/19/19 1016      PT LONG TERM GOAL #1   Title  to be set PRN at ERO            Plan - 06/25/19 0942    Clinical Impression Statement  Reviewed proper weight lifting technique with free weights- very poor hip hinge ability practiced with yard stick today. suggestefd a wall squat (mini or full) while doing abduction and flexion strengthening for postural support.Educated on use of low weight-high repetition for Lt arm functional endurance.    PT Treatment/Interventions  ADLs/Self Care Home Management;Cryotherapy;Electrical Stimulation;Traction;Moist Heat;Functional mobility training;Therapeutic activities;Therapeutic  exercise;Patient/family education;Neuromuscular re-education;Manual techniques;Passive range of motion;Dry needling;Spinal Manipulations;Taping;Joint Manipulations    PT Next Visit Plan  continue core challenges, standing balacne    PT Home Exercise Plan  child pose, hamstring stretch, quad stretch, gastroc stretch; UE horiz abd; bird dog, hip hinge+squat, triceps kick    Consulted and Agree with Plan of Care  Patient       Patient will benefit from skilled therapeutic intervention in order to improve the following deficits and impairments:  Difficulty walking, Impaired UE functional use, Increased muscle spasms, Decreased activity tolerance, Pain, Improper body mechanics, Impaired flexibility, Hypomobility, Decreased strength, Postural dysfunction  Visit Diagnosis: 1. Chronic bilateral low back pain without sciatica   2. Difficulty in walking, not elsewhere classified        Problem List Patient Active Problem List   Diagnosis Date Noted  . Back pain 06/05/2017  . Encounter for immunization 07/08/2016  . Adjustment disorder with emotional disturbance 06/28/2014  . Health care maintenance 01/03/2013  . Alcohol use 01/03/2013  . Schizoaffective disorder, bipolar type ([place]) 03/16/2007    Taci Sterling C. Davisha Linthicum PT, DPT 06/25/19 10:01 AM   [facility] [address] [place], [place], [address] Phone: [phone]   Fax:  [phone]  Name: Shawn Leach MRN: [id number] Date of Birth: Mar 15, 1981

## 2019-07-02 ENCOUNTER — Ambulatory Visit: Payer: Medicaid Other | Admitting: Physical Therapy

## 2019-07-02 ENCOUNTER — Other Ambulatory Visit: Payer: Self-pay

## 2019-07-02 ENCOUNTER — Encounter: Payer: Self-pay | Admitting: Physical Therapy

## 2019-07-02 DIAGNOSIS — M545 Low back pain, unspecified: Secondary | ICD-10-CM

## 2019-07-02 DIAGNOSIS — R262 Difficulty in walking, not elsewhere classified: Secondary | ICD-10-CM

## 2019-07-02 DIAGNOSIS — G8929 Other chronic pain: Secondary | ICD-10-CM

## 2019-07-02 NOTE — Therapy (Signed)
[place], [place], [address] Phone: [phone]   Fax:  [phone]  Physical Therapy Treatment  Patient Details  Name: Shawn Leach MRN: [id number] Date of Birth: Jul 02, 1981 Referring Provider (PT): Shawn Filler, MD   Encounter Date: 07/02/2019  PT End of Session - 07/02/19 1237    Visit Number  3    Authorization Time Period  8/9-8/29    Authorization - Visit Number  2    Authorization - Number of Visits  3    PT Start Time  1829    PT Stop Time  1310    PT Time Calculation (min)  35 min    Activity Tolerance  Patient tolerated treatment well    Behavior During Therapy  [facility] for tasks assessed/performed       Past Medical History:  Diagnosis Date  . Bipolar 1 disorder ([place])   . Bradycardia    asymptomatic, normal EKG and BMET  . Gonococcal urethritis    hx in 5/03  . High risk homosexual behavior    Hx of relationship with man 2002-2005, no known disease in the partnet  . History of psychiatric disorder    unknown prior diagnosis  . Schizoaffective disorder, unspecified type ([place]) 03/16/2007   Diagnosed since childhood - teenage years Has been on medications including Depakote and Seroquel since then  Symptoms are stable but on disability due to this mental d/o  Late 2014: Fired from [employer] due to aggressive behavior 2015 [place] [facility] [place]     . Schizophrenia ([place])   . Shoulder weakness 02/23/2018  . Tympanic membrane rupture    history of    History reviewed. No pertinent surgical history.  There were no vitals filed for this visit.  Subjective Assessment - 07/02/19 1237    Subjective  No pain today. Using muscle relaxers and stretches when it does hurt.    Currently in Pain?  No/denies                       [facility] Adult PT Treatment/Exercise - 07/02/19 0001      Lumbar Exercises: Stretches   Active Hamstring Stretch  Right;Left    x10   Passive Hamstring Stretch Limitations  seated edge of bed    Other Lumbar Stretch Exercise  seated figure 4      Lumbar Exercises: Standing   Other Standing Lumbar Exercises  single leg-opp arm reach squat    Other Standing Lumbar Exercises  squat to dead lift at FM      Lumbar Exercises: Supine   Dead Bug Limitations  dead bug extension    Bridge with [employer] Limitations  3 marches x3 rounds    Other Supine Lumbar Exercises  tabletop to leg extension- hands on thighs for position cues      Lumbar Exercises: Quadruped   Other Quadruped Lumbar Exercises  qped rocking    Other Quadruped Lumbar Exercises  primal push up               PT Short Term Goals - 06/19/19 1014      PT SHORT TERM GOAL #1   Title  Pt will have understanding to utilize stretches throughout the day to decrease spasm/tightness    Baseline  began educating at eval    Time  3    Period  Weeks    Status  New    Target Date  07/13/19      PT SHORT TERM GOAL #2   Title  LE strength to 5/5    Baseline  see flowsheet    Time  3    Period  Weeks    Status  New    Target Date  07/13/19      PT SHORT TERM GOAL #3   Title  Pt will be able to bend forward to lift light <=10lb comfortably    Baseline  unable at eval    Time  3    Period  Weeks    Status  New    Target Date  07/13/19        PT Long Term Goals - 06/19/19 1016      PT LONG TERM GOAL #1   Title  to be set PRN at ERO            Plan - 07/02/19 1312    Clinical Impression Statement  Heavy cues required for hip hinge in bending/lifting to decrease strain on lumbar spine. Next visit is last authorized and asked him to think about difficulties/pain and we will decide if further authorization is necessary.    PT Treatment/Interventions  ADLs/Self Care Home Management;Cryotherapy;Electrical Stimulation;Traction;Moist Heat;Functional mobility training;Therapeutic activities;Therapeutic exercise;Patient/family  education;Neuromuscular re-education;Manual techniques;Passive range of motion;Dry needling;Spinal Manipulations;Taping;Joint Manipulations    PT Next Visit Plan  d/Leach vs ERO?    PT Home Exercise Plan  child pose, hamstring stretch, quad stretch, gastroc stretch; UE horiz abd; bird dog, hip hinge+squat, triceps kick, squat dead lift, single leg hinge&reach, supine dead bug extension & scissors    Consulted and Agree with Plan of Care  Patient       Patient will benefit from skilled therapeutic intervention in order to improve the following deficits and impairments:  Difficulty walking, Impaired UE functional use, Increased muscle spasms, Decreased activity tolerance, Pain, Improper body mechanics, Impaired flexibility, Hypomobility, Decreased strength, Postural dysfunction  Visit Diagnosis: 1. Chronic bilateral low back pain without sciatica   2. Difficulty in walking, not elsewhere classified        Problem List Patient Active Problem List   Diagnosis Date Noted  . Back pain 06/05/2017  . Encounter for immunization 07/08/2016  . Adjustment disorder with emotional disturbance 06/28/2014  . Health care maintenance 01/03/2013  . Alcohol use 01/03/2013  . Schizoaffective disorder, bipolar type (HCC) 03/16/2007   Shawn Leach. Shawn Leach PT, DPT 07/02/19 1:15 PM   Shawn Leach [address] [place], [place], [address] Phone: [phone]   Fax:  [phone]  Name: Shawn Leach MRN: [id number] Date of Birth: 02-22-1981

## 2019-07-09 ENCOUNTER — Other Ambulatory Visit: Payer: Self-pay

## 2019-07-09 ENCOUNTER — Encounter: Payer: Self-pay | Admitting: Physical Therapy

## 2019-07-09 ENCOUNTER — Ambulatory Visit: Payer: Medicaid Other | Admitting: Physical Therapy

## 2019-07-09 DIAGNOSIS — M545 Low back pain: Secondary | ICD-10-CM | POA: Diagnosis not present

## 2019-07-09 DIAGNOSIS — G8929 Other chronic pain: Secondary | ICD-10-CM

## 2019-07-09 DIAGNOSIS — R262 Difficulty in walking, not elsewhere classified: Secondary | ICD-10-CM

## 2019-07-09 NOTE — Therapy (Signed)
[place], [place], [address] Phone: [phone]   Fax:  [phone]  Physical Therapy Treatment/ERO  Patient Details  Name: Shawn Leach MRN: [id number] Date of Birth: August 30, 1981 Referring Provider (PT): Axel Filler, MD   Encounter Date: 07/09/2019  PT End of Session - 07/09/19 0956    Visit Number  4    Authorization Type  MCD- reauth submitted 8/24    Authorization Time Period  8/9-8/29    Authorization - Visit Number  3    Authorization - Number of Visits  3    PT Start Time  0923    PT Stop Time  0956    PT Time Calculation (min)  33 min    Activity Tolerance  Patient tolerated treatment well    Behavior During Therapy  [facility] for tasks assessed/performed       Past Medical History:  Diagnosis Date  . Bipolar 1 disorder ([place])   . Bradycardia    asymptomatic, normal EKG and BMET  . Gonococcal urethritis    hx in 5/03  . High risk homosexual behavior    Hx of relationship with man 2002-2005, no known disease in the partnet  . History of psychiatric disorder    unknown prior diagnosis  . Schizoaffective disorder, unspecified type ([place]) 03/16/2007   Diagnosed since childhood - teenage years Has been on medications including Depakote and Seroquel since then  Symptoms are stable but on disability due to this mental d/o  Late 2014: Fired from [employer] due to aggressive behavior 2015 Started attending [facility] in [place]     . Schizophrenia ([place])   . Shoulder weakness 02/23/2018  . Tympanic membrane rupture    history of    History reviewed. No pertinent surgical history.  There were no vitals filed for this visit.  Subjective Assessment - 07/09/19 0928    Subjective  I start feeling it after I am done with activities. took a mucle relaxer after doing a lot of cleaning late last night. I feel much better, its at the end of the day that I feel it.    Patient Stated Goals  cleaning  up, bend forward, lifting    Currently in Pain?  No/denies         [facility] PT Assessment - 07/09/19 0001      Assessment   Medical Diagnosis  low back pain    Referring Provider (PT)  Axel Filler, MD    Onset Date/Surgical Date  --   one year ago (06/15/18)     Sensation   Additional Comments  feels weak after about the 3rd day of standing      Strength   Right Hip Flexion  4+/5    Right Hip Extension  4+/5    Right Hip ABduction  5/5    Left Hip Flexion  5/5    Left Hip Extension  5/5    Left Hip ABduction  5/5      Palpation   Spinal mobility  limited thoracic and lumbar    Palpation comment  TTP bil thoraco lumbar paraspinals                           PT Education - 07/09/19 2025    Education Details  goals, progress, POC, TPDN & expected outcomes    Person(s) Educated  Patient    Methods  Explanation    Comprehension  Verbalized understanding;Need further instruction       PT Short Term Goals - 07/09/19 0931      PT SHORT TERM GOAL #1   Title  Pt will have understanding to utilize stretches throughout the day to decrease spasm/tightness    Status  Achieved      PT SHORT TERM GOAL #2   Title  LE strength to 5/5    Baseline  see flowsheet    Status  On-going      PT SHORT TERM GOAL #3   Title  Pt will be able to bend forward to lift light <=10lb comfortably    Status  Achieved        PT Long Term Goals - 07/09/19 2026      PT LONG TERM GOAL #1   Title  Pt will be able to perform proper hip hinge for lifting and squatting technique    Baseline  poor hip hinge with overuse of thoracic kyphosis    Time  5    Period  Weeks    Status  New    Target Date  08/17/19      PT LONG TERM GOAL #2   Title  Pt will demo 5/5 hip strength    Baseline  see flowsheet    Time  5    Period  Weeks    Status  New    Target Date  08/17/19      PT LONG TERM GOAL #3   Title  Pt will verbalize resolution of legs "giving out" with fatigue     Baseline  takes a couple of days of cleaning but is functionally limited by this sensation    Time  5    Period  Weeks    Status  New    Target Date  08/17/19            Plan - 07/09/19 1039    Clinical Impression Statement  Pt reports he is able to complete a long day of sweeping, cleaning, dishes etc but feels like he needs a muscle relaxer at the end of the day. denies regular radicular symptoms, just fatigue in lower extremities after about 3 days of hard work. Left arm still seems to "not cooperate" and can feel heavy. Significant tihtness noted in bilateral lumbar paraspinals addressed with manual therapy and dry needling today. Pt still has significant difficulty with hip hinge and compensates by utilizing thoracic kyphosis to bend and reach resulting in overuse of lumbar paraspinals. pt will continue to benefit from skilled PT to reduce excessive resting tension and continue working on functional movement for long term management.    PT Treatment/Interventions  ADLs/Self Care Home Management;Cryotherapy;Electrical Stimulation;Traction;Moist Heat;Functional mobility training;Therapeutic activities;Therapeutic exercise;Patient/family education;Neuromuscular re-education;Manual techniques;Passive range of motion;Dry needling;Spinal Manipulations;Taping;Joint Manipulations    PT Next Visit Plan  outcome of DN? STM to paraspinals, hip hinge    PT Home Exercise Plan  child pose, hamstring stretch, quad stretch, gastroc stretch; UE horiz abd; bird dog, hip hinge+squat, triceps kick, squat dead lift, single leg hinge&reach, supine dead bug extension & scissors    Consulted and Agree with Plan of Care  Patient       Patient will benefit from skilled therapeutic intervention in order to improve the following deficits and impairments:  Difficulty walking, Impaired UE functional use, Increased muscle spasms, Decreased activity tolerance, Pain, Improper body mechanics, Impaired flexibility,  Hypomobility, Decreased strength, Postural dysfunction  Visit Diagnosis: Chronic bilateral low back pain without  sciatica - Plan: PT plan of care cert/re-cert  Difficulty in walking, not elsewhere classified - Plan: PT plan of care cert/re-cert     Problem List Patient Active Problem List   Diagnosis Date Noted  . Back pain 06/05/2017  . Encounter for immunization 07/08/2016  . Adjustment disorder with emotional disturbance 06/28/2014  . Health care maintenance 01/03/2013  . Alcohol use 01/03/2013  . Schizoaffective disorder, bipolar type (HCC) 03/16/2007   Escarlet Saathoff C. Eliodoro Gullett PT, DPT 07/09/19 8:34 PM    [facility] [address] [place], [place], [address] Phone: [phone]   Fax:  [phone]  Name: Vick FreesRawmanne H Bolender MRN: [id number] Date of Birth: 02/11/1981

## 2019-07-17 ENCOUNTER — Ambulatory Visit
Payer: Medicaid Other | Attending: Student in an Organized Health Care Education/Training Program | Admitting: Physical Therapy

## 2019-07-17 ENCOUNTER — Other Ambulatory Visit: Payer: Self-pay

## 2019-07-17 DIAGNOSIS — M545 Low back pain, unspecified: Secondary | ICD-10-CM

## 2019-07-17 DIAGNOSIS — G8929 Other chronic pain: Secondary | ICD-10-CM | POA: Diagnosis present

## 2019-07-17 DIAGNOSIS — R262 Difficulty in walking, not elsewhere classified: Secondary | ICD-10-CM | POA: Insufficient documentation

## 2019-07-17 NOTE — Therapy (Signed)
[place], [place], [address] Phone: [phone]   Fax:  [phone]  Physical Therapy Treatment  Patient Details  Name: Shawn Leach MRN: [id number] Date of Birth: Sep 08, 1981 Referring Provider (PT): Axel Filler, MD   Encounter Date: 07/17/2019  PT End of Session - 07/17/19 1615    Visit Number  5    Authorization Type  MCD- reauth submitted 8/24    Authorization Time Period  8/9-8/29    Authorization - Visit Number  4    Authorization - Number of Visits  3    PT Start Time  2641    PT Stop Time  1615    PT Time Calculation (min)  42 min    Activity Tolerance  Patient tolerated treatment well    Behavior During Therapy  [facility] for tasks assessed/performed       Past Medical History:  Diagnosis Date  . Bipolar 1 disorder ([place])   . Bradycardia    asymptomatic, normal EKG and BMET  . Gonococcal urethritis    hx in 5/03  . High risk homosexual behavior    Hx of relationship with man 2002-2005, no known disease in the partnet  . History of psychiatric disorder    unknown prior diagnosis  . Schizoaffective disorder, unspecified type ([place]) 03/16/2007   Diagnosed since childhood - teenage years Has been on medications including Depakote and Seroquel since then  Symptoms are stable but on disability due to this mental d/o  Late 2014: Fired from [facility] due to aggressive behavior 2015 Started attending [facility] in [place]     . Schizophrenia ([place])   . Shoulder weakness 02/23/2018  . Tympanic membrane rupture    history of    No past surgical history on file.  There were no vitals filed for this visit.  Subjective Assessment - 07/17/19 1609    Subjective  My feels ok, still some pain but not as bad unless I do too much. I think the stretches help (he does not rate pain)    How long can you walk comfortably?  can move around about 9 hours    Patient Stated Goals  cleaning up, bend forward,  lifting                       [facility] Adult PT Treatment/Exercise - 07/17/19 0001      Lumbar Exercises: Stretches   Active Hamstring Stretch  Right;Left    Passive Hamstring Stretch Limitations  seated edge of bed    Other Lumbar Stretch Exercise  seated figure 4    Other Lumbar Stretch Exercise  child pose      Lumbar Exercises: Aerobic   Recumbent Bike  L2 X 5 min      Lumbar Exercises: Standing   Other Standing Lumbar Exercises  SL RDL/hip hinge 2X 10 reaching to low mat table to reinforce hip hinge progressed to deadlift from 8 inch step with 10 lb KB X 15, then DB swing X 15 reps with 10 lb      Lumbar Exercises: Seated   Other Seated Lumbar Exercises  sit to stand 2X 10 for hip hinge      Lumbar Exercises: Supine   Bridge Limitations  Bridge with alt knee ext X 10 each side      Lumbar Exercises: Quadruped   Opposite Arm/Leg Raise  Right arm/Left leg;Left arm/Right leg;10 reps    Other Quadruped Lumbar Exercises  cat-camel 5 sec X 10 ea    Other Quadruped Lumbar Exercises  kneeling back onto heels coming up into hip extension, then hinging back down onto heels to reinforce hip hinging             PT Education - 07/17/19 1614    Education Details  lifitng mechanics and proper hip hinge    Person(s) Educated  Patient    Methods  Explanation;Demonstration;Tactile cues;Verbal cues    Comprehension  Need further instruction       PT Short Term Goals - 07/09/19 0931      PT SHORT TERM GOAL #1   Title  Pt will have understanding to utilize stretches throughout the day to decrease spasm/tightness    Status  Achieved      PT SHORT TERM GOAL #2   Title  LE strength to 5/5    Baseline  see flowsheet    Status  On-going      PT SHORT TERM GOAL #3   Title  Pt will be able to bend forward to lift light <=10lb comfortably    Status  Achieved        PT Long Term Goals - 07/09/19 2026      PT LONG TERM GOAL #1   Title  Pt will be able to perform  proper hip hinge for lifting and squatting technique    Baseline  poor hip hinge with overuse of thoracic kyphosis    Time  5    Period  Weeks    Status  New    Target Date  08/17/19      PT LONG TERM GOAL #2   Title  Pt will demo 5/5 hip strength    Baseline  see flowsheet    Time  5    Period  Weeks    Status  New    Target Date  08/17/19      PT LONG TERM GOAL #3   Title  Pt will verbalize resolution of legs "giving out" with fatigue    Baseline  takes a couple of days of cleaning but is functionally limited by this sensation    Time  5    Period  Weeks    Status  New    Target Date  08/17/19            Plan - 07/17/19 1615    Clinical Impression Statement  He relays the DN and stretching are helping some but he still has pain if he does too much. Went over lifing mechanics and hip hinge and was provided exercises to incorporate this. He is still very inconsistent with his lifting form as he still does not hip hinge well and instead reaches into Thoracic kyphosis. PT will continue to progress as able.    Rehab Potential  Good    PT Frequency  1x / week    PT Duration  3 weeks    PT Treatment/Interventions  ADLs/Self Care Home Management;Cryotherapy;Electrical Stimulation;Traction;Moist Heat;Functional mobility training;Therapeutic activities;Therapeutic exercise;Patient/family education;Neuromuscular re-education;Manual techniques;Passive range of motion;Dry needling;Spinal Manipulations;Taping;Joint Manipulations    PT Next Visit Plan  outcome of DN? STM to paraspinals, hip hinge    PT Home Exercise Plan  child pose, hamstring stretch, quad stretch, gastroc stretch; UE horiz abd; bird dog, hip hinge+squat, triceps kick, squat dead lift, single leg hinge&reach, supine dead bug extension & scissors    Consulted and Agree with Plan of Care  Patient  Patient will benefit from skilled therapeutic intervention in order to improve the following deficits and impairments:   Difficulty walking, Impaired UE functional use, Increased muscle spasms, Decreased activity tolerance, Pain, Improper body mechanics, Impaired flexibility, Hypomobility, Decreased strength, Postural dysfunction  Visit Diagnosis: Chronic bilateral low back pain without sciatica  Difficulty in walking, not elsewhere classified     Problem List Patient Active Problem List   Diagnosis Date Noted  . Back pain 06/05/2017  . Encounter for immunization 07/08/2016  . Adjustment disorder with emotional disturbance 06/28/2014  . Health care maintenance 01/03/2013  . Alcohol use 01/03/2013  . Schizoaffective disorder, bipolar type [facility]) 03/16/2007    Birdie Riddle 07/17/2019, 4:21 PM  [facility] [address] [place], [place], [address] Phone: [phone]   Fax:  [phone]  Name: Shawn Leach MRN: [id number] Date of Birth: 08-Nov-1981

## 2019-07-19 ENCOUNTER — Encounter: Payer: Self-pay | Admitting: Physical Therapy

## 2019-07-19 ENCOUNTER — Other Ambulatory Visit: Payer: Self-pay

## 2019-07-19 ENCOUNTER — Ambulatory Visit: Payer: Medicaid Other | Admitting: Physical Therapy

## 2019-07-19 DIAGNOSIS — R262 Difficulty in walking, not elsewhere classified: Secondary | ICD-10-CM

## 2019-07-19 DIAGNOSIS — G8929 Other chronic pain: Secondary | ICD-10-CM

## 2019-07-19 DIAGNOSIS — M545 Low back pain: Secondary | ICD-10-CM | POA: Diagnosis not present

## 2019-07-19 NOTE — Therapy (Signed)
[facility] [address] [place], [place], [address] Phone: [phone]   Fax:  [phone]  Physical Therapy Treatment  Patient Details  Name: Shawn Leach MRN: [id number] Date of Birth: [birth year] Referring Provider (PT): Tyson AliasVincent, Duncan Thomas, MD   Encounter Date: 07/19/2019  PT End of Session - 07/19/19 1420    Visit Number  6    Authorization Type  MCD- reauth submitted 8/24, 8 more visits until 9/27    Authorization Time Period  9/27    Authorization - Visit Number  2    Authorization - Number of Visits  8    PT Start Time  1135    PT Stop Time  1215    PT Time Calculation (min)  40 min    Activity Tolerance  Patient tolerated treatment well    Behavior During Therapy  [facility] for tasks assessed/performed       Past Medical History:  Diagnosis Date  . Bipolar 1 disorder ([facility])   . Bradycardia    asymptomatic, normal EKG and BMET  . Gonococcal urethritis    hx in 5/03  . High risk homosexual behavior    Hx of relationship with man 2002-2005, no known disease in the partnet  . History of psychiatric disorder    unknown prior diagnosis  . Schizoaffective disorder, unspecified type ([facility]) 03/16/2007   Diagnosed since childhood - teenage years Has been on medications including Depakote and Seroquel since then  Symptoms are stable but on disability due to this mental d/o  Late 2014: Fired from [employer] due to aggressive behavior 2015 Started attending [facility] in [place]     . Schizophrenia ([facility])   . Shoulder weakness 02/23/2018  . Tympanic membrane rupture    history of    History reviewed. No pertinent surgical history.  There were no vitals filed for this visit.  Subjective Assessment - 07/19/19 1340    Subjective  I have pain in my Lt hip/back, I dont know what causes it, it does not hurt when I am sitting only when standing, I wish someone could tell me what it is, I don't know why they wont do imaging.    How  long can you walk comfortably?  can move around about 9 hours    Patient Stated Goals  cleaning up, bend forward, lifting    Currently in Pain?  Yes    Pain Score  6     Pain Location  Back    Pain Orientation  Left;Lower    Pain Descriptors / Indicators  Aching         [facility] PT Assessment - 07/19/19 0001      Assessment   Medical Diagnosis  low back pain    Referring Provider (PT)  Tyson AliasVincent, Duncan Thomas, MD      Special Tests   Other special tests  neg SLR, neg quadrant test, neg slump test, neg pain with spinal PAM testing, +FABERS on Lt only                   [facility] Adult PT Treatment/Exercise - 07/19/19 0001      Lumbar Exercises: Stretches   Active Hamstring Stretch  Right;Left;2 reps;30 seconds    Passive Hamstring Stretch Limitations  seated edge of bed    Lower Trunk Rotation Limitations  10 sec  X 5 reps    Other Lumbar Stretch Exercise  supine fig 4 piriformis 2X 30 sec    Other  Lumbar Stretch Exercise  child pose 3X30 sec      Lumbar Exercises: Aerobic   Recumbent Bike  L2 X 5 min      Lumbar Exercises: Machines for Strengthening   Cybex Knee Extension  15 lbs  x20    Cybex Knee Flexion  35 lbs X 20    Leg Press  65 lbs 3X10    Other Lumbar Machine Exercise  rows and lat pull machines X 20 ea               PT Short Term Goals - 07/09/19 0931      PT SHORT TERM GOAL #1   Title  Pt will have understanding to utilize stretches throughout the day to decrease spasm/tightness    Status  Achieved      PT SHORT TERM GOAL #2   Title  LE strength to 5/5    Baseline  see flowsheet    Status  On-going      PT SHORT TERM GOAL #3   Title  Pt will be able to bend forward to lift light <=10lb comfortably    Status  Achieved        PT Long Term Goals - 07/09/19 2026      PT LONG TERM GOAL #1   Title  Pt will be able to perform proper hip hinge for lifting and squatting technique    Baseline  poor hip hinge with overuse of thoracic kyphosis     Time  5    Period  Weeks    Status  New    Target Date  08/17/19      PT LONG TERM GOAL #2   Title  Pt will demo 5/5 hip strength    Baseline  see flowsheet    Time  5    Period  Weeks    Status  New    Target Date  08/17/19      PT LONG TERM GOAL #3   Title  Pt will verbalize resolution of legs "giving out" with fatigue    Baseline  takes a couple of days of cleaning but is functionally limited by this sensation    Time  5    Period  Weeks    Status  New    Target Date  08/17/19            Plan - 07/19/19 1530    Clinical Impression Statement  He relays no pain unless he is standing and has pain on his Lt lumbar/posterior hip region. He is able to perform exercises for lumbar and hip without complaints but seems frustrated that he has pain with standing. Special testing negative today except some discomfor with FABERS, so hard to say what is causing his pain. PT will continue to improve his function and pain in standing as able. If he does not progress will refer back to MD for imaging as he has not had any.    Rehab Potential  Good    PT Frequency  1x / week    PT Duration  3 weeks    PT Treatment/Interventions  ADLs/Self Care Home Management;Cryotherapy;Electrical Stimulation;Traction;Moist Heat;Functional mobility training;Therapeutic activities;Therapeutic exercise;Patient/family education;Neuromuscular re-education;Manual techniques;Passive range of motion;Dry needling;Spinal Manipulations;Taping;Joint Manipulations    PT Next Visit Plan  outcome of DN? STM to paraspinals, hip hinge    PT Home Exercise Plan  child pose, hamstring stretch, quad stretch, gastroc stretch; UE horiz abd; bird dog, hip hinge+squat, triceps kick, squat dead  lift, single leg hinge&reach, supine dead bug extension & scissors    Consulted and Agree with Plan of Care  Patient       Patient will benefit from skilled therapeutic intervention in order to improve the following deficits and  impairments:  Difficulty walking, Impaired UE functional use, Increased muscle spasms, Decreased activity tolerance, Pain, Improper body mechanics, Impaired flexibility, Hypomobility, Decreased strength, Postural dysfunction  Visit Diagnosis: Chronic bilateral low back pain without sciatica  Difficulty in walking, not elsewhere classified     Problem List Patient Active Problem List   Diagnosis Date Noted  . Back pain 06/05/2017  . Encounter for immunization 07/08/2016  . Adjustment disorder with emotional disturbance 06/28/2014  . Health care maintenance 01/03/2013  . Alcohol use 01/03/2013  . Schizoaffective disorder, bipolar type [facility]) 03/16/2007    Birdie Riddle 07/19/2019, 3:39 PM  [facility] [address] [place], [place], [address] Phone: [phone]   Fax:  [phone]  Name: Shawn Leach MRN: [id number] Date of Birth: 05/28/1981

## 2019-07-24 ENCOUNTER — Other Ambulatory Visit: Payer: Self-pay

## 2019-07-24 ENCOUNTER — Ambulatory Visit: Payer: Medicaid Other | Admitting: Physical Therapy

## 2019-07-24 DIAGNOSIS — G8929 Other chronic pain: Secondary | ICD-10-CM

## 2019-07-24 DIAGNOSIS — M545 Low back pain: Secondary | ICD-10-CM | POA: Diagnosis not present

## 2019-07-24 DIAGNOSIS — R262 Difficulty in walking, not elsewhere classified: Secondary | ICD-10-CM

## 2019-07-24 NOTE — Therapy (Signed)
[facility] [address] [place], [place], [address] Phone: [phone]   Fax:  [phone]  Physical Therapy Treatment  Patient Details  Name: Shawn Leach MRN: [id number] Date of Birth: [birth year] Referring Provider (PT): Tyson Alias, MD   Encounter Date: 07/24/2019  PT End of Session - 07/24/19 1241    Visit Number  7    Authorization Type  MCD- reauth submitted 8/24, 8 more visits until 9/27    Authorization Time Period  9/27    Authorization - Visit Number  3    Authorization - Number of Visits  8    PT Start Time  1215    PT Stop Time  1253    PT Time Calculation (min)  38 min    Activity Tolerance  Patient tolerated treatment well    Behavior During Therapy  [facility] for tasks assessed/performed       Past Medical History:  Diagnosis Date  . Bipolar 1 disorder ([facility])   . Bradycardia    asymptomatic, normal EKG and BMET  . Gonococcal urethritis    hx in 5/03  . High risk homosexual behavior    Hx of relationship with man 2002-2005, no known disease in the partnet  . History of psychiatric disorder    unknown prior diagnosis  . Schizoaffective disorder, unspecified type ([facility]) 03/16/2007   Diagnosed since childhood - teenage years Has been on medications including Depakote and Seroquel since then  Symptoms are stable but on disability due to this mental d/o  Late 2014: Fired from [employer] [facility] due to aggressive behavior 2015 Started attending [facility] in [place]     . Schizophrenia ([facility])   . Shoulder weakness 02/23/2018  . Tympanic membrane rupture    history of    No past surgical history on file.  There were no vitals filed for this visit.  Subjective Assessment - 07/24/19 1240    Subjective  I have not had pain since last visit after you popped my hip and back!    How long can you walk comfortably?  can move around about 9 hours    Patient Stated Goals  cleaning up, bend forward, lifting    Currently  in Pain?  No/denies                       [facility] Adult PT Treatment/Exercise - 07/24/19 0001      Lumbar Exercises: Stretches   Other Lumbar Stretch Exercise  supine fig 4 piriformis 2X 30 sec    Other Lumbar Stretch Exercise  child pose 3X30 sec      Lumbar Exercises: Aerobic   Recumbent Bike  L2 X 7 min      Lumbar Exercises: Machines for Strengthening   Leg Press  75 lbs 2X15    Other Lumbar Machine Exercise  rows and lat pull machines 25 lbs X 20 ea      Lumbar Exercises: Standing   Other Standing Lumbar Exercises  SL RDL/hip hinge 2X 10 reaching to low mat table to reinforce hip hinge      Lumbar Exercises: Supine   Single Leg Bridge  --   2X10 bilat     Lumbar Exercises: Quadruped   [place]  10 reps    Opposite Arm/Leg Raise  Right arm/Left leg;Left arm/Right leg;10 reps      Manual Therapy   Manual therapy comments  lumbar rotational mobs/manipulation grade 4-5 with one cavitation, Lt hip  LAD mobs/manipulation grade 4-5               PT Short Term Goals - 07/09/19 0931      PT SHORT TERM GOAL #1   Title  Pt will have understanding to utilize stretches throughout the day to decrease spasm/tightness    Status  Achieved      PT SHORT TERM GOAL #2   Title  LE strength to 5/5    Baseline  see flowsheet    Status  On-going      PT SHORT TERM GOAL #3   Title  Pt will be able to bend forward to lift light <=10lb comfortably    Status  Achieved        PT Long Term Goals - 07/09/19 2026      PT LONG TERM GOAL #1   Title  Pt will be able to perform proper hip hinge for lifting and squatting technique    Baseline  poor hip hinge with overuse of thoracic kyphosis    Time  5    Period  Weeks    Status  New    Target Date  08/17/19      PT LONG TERM GOAL #2   Title  Pt will demo 5/5 hip strength    Baseline  see flowsheet    Time  5    Period  Weeks    Status  New    Target Date  08/17/19      PT LONG TERM GOAL #3   Title   Pt will verbalize resolution of legs "giving out" with fatigue    Baseline  takes a couple of days of cleaning but is functionally limited by this sensation    Time  5    Period  Weeks    Status  New    Target Date  08/17/19            Plan - 07/24/19 1242    Clinical Impression Statement  He had no pain after Lt hip and lumbar manipulation last session so this was continued today followed by progressing his streching and strengthening without complaints.    Rehab Potential  Good    PT Frequency  1x / week    PT Duration  3 weeks    PT Treatment/Interventions  ADLs/Self Care Home Management;Cryotherapy;Electrical Stimulation;Traction;Moist Heat;Functional mobility training;Therapeutic activities;Therapeutic exercise;Patient/family education;Neuromuscular re-education;Manual techniques;Passive range of motion;Dry needling;Spinal Manipulations;Taping;Joint Manipulations    PT Next Visit Plan  outcome of DN? STM to paraspinals, hip hinge    PT Home Exercise Plan  child pose, hamstring stretch, quad stretch, gastroc stretch; UE horiz abd; bird dog, hip hinge+squat, triceps kick, squat dead lift, single leg hinge&reach, supine dead bug extension & scissors    Consulted and Agree with Plan of Care  Patient       Patient will benefit from skilled therapeutic intervention in order to improve the following deficits and impairments:  Difficulty walking, Impaired UE functional use, Increased muscle spasms, Decreased activity tolerance, Pain, Improper body mechanics, Impaired flexibility, Hypomobility, Decreased strength, Postural dysfunction  Visit Diagnosis: Chronic bilateral low back pain without sciatica  Difficulty in walking, not elsewhere classified     Problem List Patient Active Problem List   Diagnosis Date Noted  . Back pain 06/05/2017  . Encounter for immunization 07/08/2016  . Adjustment disorder with emotional disturbance 06/28/2014  . Health care maintenance 01/03/2013   . Alcohol use 01/03/2013  . Schizoaffective disorder, bipolar type ([place])  03/16/2007    Birdie RiddleBrian R Nelson,PT,DPT 07/24/2019, 2:57 PM  [facility] [address] [place], [place], [address] Phone: [phone]   Fax:  [phone]  Name: Vick FreesRawmanne H Langstaff MRN: [id number] Date of Birth: [birth year]

## 2019-07-26 ENCOUNTER — Other Ambulatory Visit: Payer: Self-pay

## 2019-07-26 ENCOUNTER — Ambulatory Visit: Payer: Medicaid Other | Admitting: Physical Therapy

## 2019-07-26 DIAGNOSIS — R262 Difficulty in walking, not elsewhere classified: Secondary | ICD-10-CM

## 2019-07-26 DIAGNOSIS — M545 Low back pain: Secondary | ICD-10-CM | POA: Diagnosis not present

## 2019-07-26 DIAGNOSIS — G8929 Other chronic pain: Secondary | ICD-10-CM

## 2019-07-26 IMAGING — CR DG CHEST 2V
2 series · 2 of 2 positions shown · non-contrast
Comparison: Chest radiograph dated 04/13/2009

CLINICAL DATA: 36-year-old male with chest pain.

EXAM:
CHEST  2 VIEW

[chest pa]
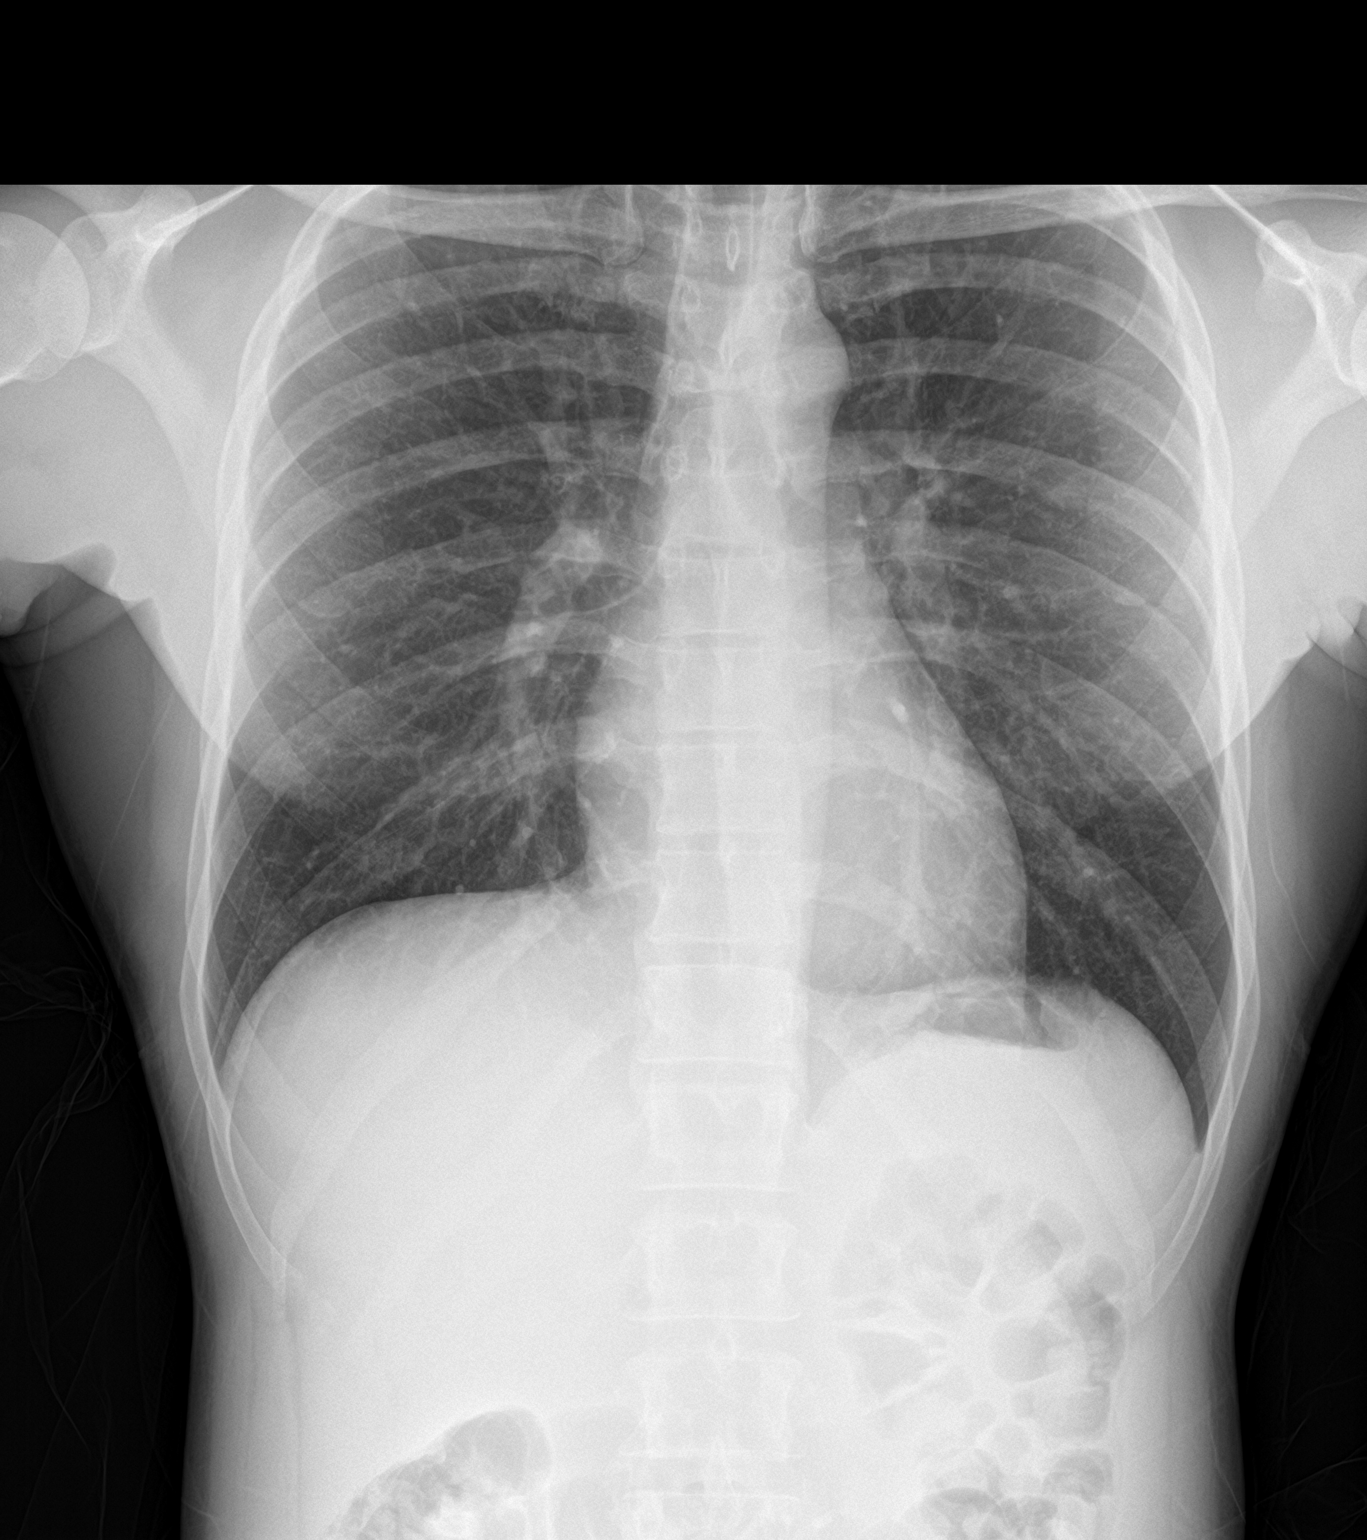

[chest lat]
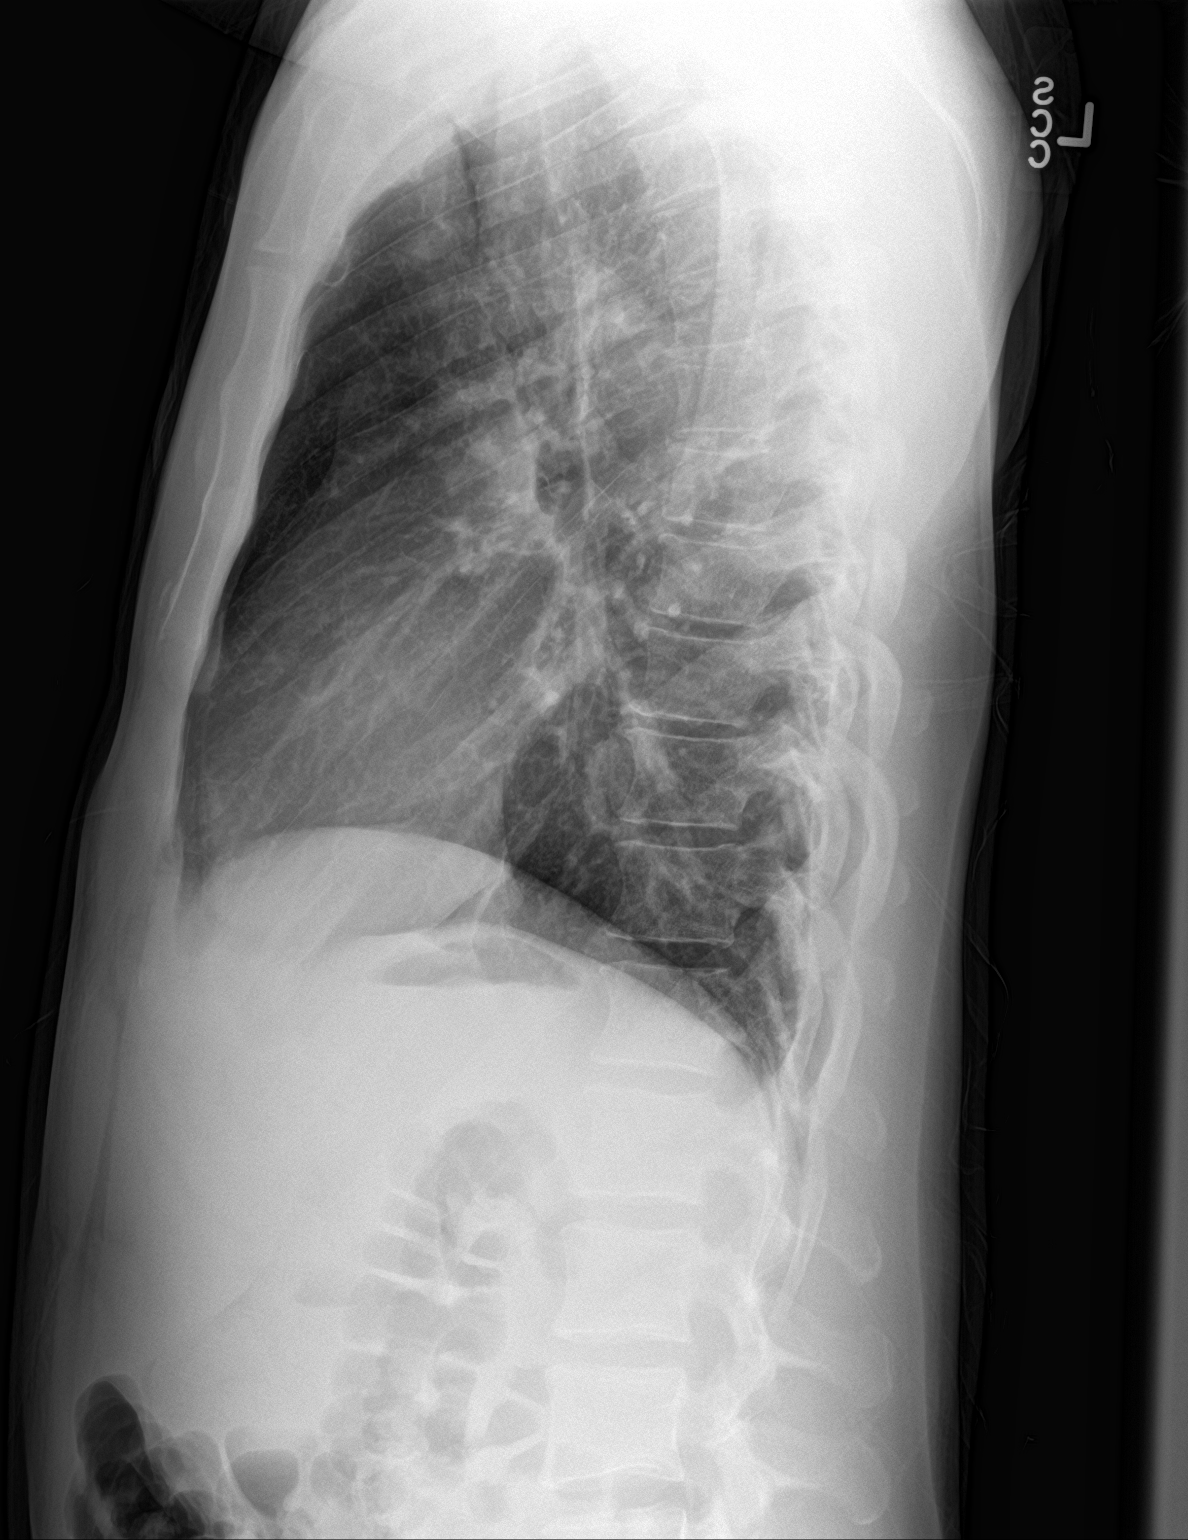

[2 of 2 positions shown; findings below may reference images not displayed]

FINDINGS: The heart size and mediastinal contours are within normal limits.
Both lungs are clear. The visualized skeletal structures are
unremarkable.
IMPRESSION: No active cardiopulmonary disease.

## 2019-07-26 NOTE — Therapy (Signed)
[place], [place], [address] Phone: [phone]   Fax:  [phone]  Physical Therapy Treatment  Patient Details  Name: Shawn Leach MRN: [id number] Date of Birth: 1981-08-07 Referring Provider (PT): Axel Filler, MD   Encounter Date: 07/26/2019  PT End of Session - 07/26/19 0850    Visit Number  8    Authorization Type  MCD- reauth submitted 8/24, 8 more visits until 9/27    Authorization Time Period  9/27    Authorization - Visit Number  4    Authorization - Number of Visits  8    PT Start Time  8144    PT Stop Time  0927    PT Time Calculation (min)  40 min    Activity Tolerance  Patient tolerated treatment well    Behavior During Therapy  [facility] for tasks assessed/performed       Past Medical History:  Diagnosis Date  . Bipolar 1 disorder ([place])   . Bradycardia    asymptomatic, normal EKG and BMET  . Gonococcal urethritis    hx in 5/03  . High risk homosexual behavior    Hx of relationship with man 2002-2005, no known disease in the partnet  . History of psychiatric disorder    unknown prior diagnosis  . Schizoaffective disorder, unspecified type ([place]) 03/16/2007   Diagnosed since childhood - teenage years Has been on medications including Depakote and Seroquel since then  Symptoms are stable but on disability due to this mental d/o  Late 2014: Fired from [employer] due to aggressive behavior 2015 Started attending [facility] in [place]     . Schizophrenia ([place])   . Shoulder weakness 02/23/2018  . Tympanic membrane rupture    history of    No past surgical history on file.  There were no vitals filed for this visit.  Subjective Assessment - 07/26/19 0851    Subjective  Have not been doing much, not hurting since manipulation. I have been trying to sit with better posture.    Patient Stated Goals  cleaning up, bend forward, lifting    Currently in Pain?  No/denies          [facility] PT Assessment - 07/26/19 0001      Strength   Right Hip Flexion  5/5    Right Hip Extension  5/5                   [facility] Adult PT Treatment/Exercise - 07/26/19 0001      Lumbar Exercises: Stretches   Passive Hamstring Stretch Limitations  seated edge of bed    Figure 4 Stretch  3 reps;30 seconds   both     Lumbar Exercises: Aerobic   Nustep  5 min L7 Ue & Le      Lumbar Exercises: Standing   Functional Squats Limitations  with 10lb kettle bell    Forward Lunge  10 reps   each   Other Standing Lumbar Exercises  half kneel fwd lean    Other Standing Lumbar Exercises  half kneel chops      Lumbar Exercises: Sidelying   Other Sidelying Lumbar Exercises  rainbows- large range    Other Sidelying Lumbar Exercises  hip ER- laying working side-lift foot from table               PT Short Term Goals - 07/09/19 0931      PT SHORT TERM GOAL #1  Title  Pt will have understanding to utilize stretches throughout the day to decrease spasm/tightness    Status  Achieved      PT SHORT TERM GOAL #2   Title  LE strength to 5/5    Baseline  see flowsheet    Status  On-going      PT SHORT TERM GOAL #3   Title  Pt will be able to bend forward to lift light <=10lb comfortably    Status  Achieved        PT Long Term Goals - 07/09/19 2026      PT LONG TERM GOAL #1   Title  Pt will be able to perform proper hip hinge for lifting and squatting technique    Baseline  poor hip hinge with overuse of thoracic kyphosis    Time  5    Period  Weeks    Status  New    Target Date  08/17/19      PT LONG TERM GOAL #2   Title  Pt will demo 5/5 hip strength    Baseline  see flowsheet    Time  5    Period  Weeks    Status  New    Target Date  08/17/19      PT LONG TERM GOAL #3   Title  Pt will verbalize resolution of legs "giving out" with fatigue    Baseline  takes a couple of days of cleaning but is functionally limited by this sensation    Time  5     Period  Weeks    Status  New    Target Date  08/17/19            Plan - 07/26/19 [id number]    Clinical Impression Statement  continues to feel really good after cavitations, stretches/exercises to encourage maintenance of mobility. Significant improvement in hip hinge. If he is still feeling good, will end POC at next visit.    PT Treatment/Interventions  ADLs/Self Care Home Management;Cryotherapy;Electrical Stimulation;Traction;Moist Heat;Functional mobility training;Therapeutic activities;Therapeutic exercise;Patient/family education;Neuromuscular re-education;Manual techniques;Passive range of motion;Dry needling;Spinal Manipulations;Taping;Joint Manipulations    PT Next Visit Plan  re-eval    PT Home Exercise Plan  child pose, hamstring stretch, quad stretch, gastroc stretch; UE horiz abd; bird dog, hip hinge+squat, triceps kick, squat dead lift, single leg hinge&reach, supine dead bug extension & scissors, half kneeling chop & forward lean, SL rainbows, SL hip ER    Consulted and Agree with Plan of Care  Patient       Patient will benefit from skilled therapeutic intervention in order to improve the following deficits and impairments:  Difficulty walking, Impaired UE functional use, Increased muscle spasms, Decreased activity tolerance, Pain, Improper body mechanics, Impaired flexibility, Hypomobility, Decreased strength, Postural dysfunction  Visit Diagnosis: Chronic bilateral low back pain without sciatica  Difficulty in walking, not elsewhere classified     Problem List Patient Active Problem List   Diagnosis Date Noted  . Back pain 06/05/2017  . Encounter for immunization 07/08/2016  . Adjustment disorder with emotional disturbance 06/28/2014  . Health care maintenance 01/03/2013  . Alcohol use 01/03/2013  . Schizoaffective disorder, bipolar type (HCC) 03/16/2007    Aedan Geimer C. Lashunta Frieden PT, DPT 07/26/19 12:11 PM   [facility] [address] [place], [place], [address] Phone: [phone]   Fax:  [phone]  Name: Shawn Leach MRN: [id number] Date of Birth: 1981-01-23

## 2019-07-31 ENCOUNTER — Encounter: Payer: Self-pay | Admitting: Physical Therapy

## 2019-07-31 ENCOUNTER — Ambulatory Visit: Payer: Medicaid Other | Admitting: Physical Therapy

## 2019-07-31 ENCOUNTER — Other Ambulatory Visit: Payer: Self-pay

## 2019-07-31 DIAGNOSIS — G8929 Other chronic pain: Secondary | ICD-10-CM

## 2019-07-31 DIAGNOSIS — R262 Difficulty in walking, not elsewhere classified: Secondary | ICD-10-CM

## 2019-07-31 DIAGNOSIS — M545 Low back pain: Secondary | ICD-10-CM | POA: Diagnosis not present

## 2019-07-31 NOTE — Therapy (Signed)
[place], [place], [address] Phone: [phone]   Fax:  [phone]  Physical Therapy Treatment/Discharge  Patient Details  Name: Shawn Leach MRN: [id number] Date of Birth: February 05, 1981 Referring Provider (PT): Axel Filler, MD   Encounter Date: 07/31/2019  PT End of Session - 07/31/19 1024    Visit Number  9    Authorization Type  MCD- reauth submitted 8/24, 8 more visits until 9/27    Authorization Time Period  9/27    Authorization - Visit Number  5    Authorization - Number of Visits  8    PT Start Time  1017    PT Stop Time  1040    PT Time Calculation (min)  23 min    Activity Tolerance  Patient tolerated treatment well    Behavior During Therapy  [facility] for tasks assessed/performed       Past Medical History:  Diagnosis Date  . Bipolar 1 disorder ([place])   . Bradycardia    asymptomatic, normal EKG and BMET  . Gonococcal urethritis    hx in 5/03  . High risk homosexual behavior    Hx of relationship with man 2002-2005, no known disease in the partnet  . History of psychiatric disorder    unknown prior diagnosis  . Schizoaffective disorder, unspecified type ([place]) 03/16/2007   Diagnosed since childhood - teenage years Has been on medications including Depakote and Seroquel since then  Symptoms are stable but on disability due to this mental d/o  Late 2014: Fired from [employer] due to aggressive behavior 2015 Started attending [facility] in [place]     . Schizophrenia ([place])   . Shoulder weakness 02/23/2018  . Tympanic membrane rupture    history of    History reviewed. No pertinent surgical history.  There were no vitals filed for this visit.  Subjective Assessment - 07/31/19 1020    Subjective  I have not needed my muscle relaxers. My arm is still th ebiggest problem.    Patient Stated Goals  cleaning up, bend forward, lifting    Currently in Pain?  No/denies         [facility] PT  Assessment - 07/31/19 0001      Assessment   Medical Diagnosis  low back pain    Referring Provider (PT)  Axel Filler, MD      Sensation   Additional Comments  [facility]      Strength   Overall Strength Comments  gross LE 5/5      Special Tests   Other special tests  Lendon Collar                           PT Education - 07/31/19 1053    Education Details  see paln    Person(s) Educated  Patient    Methods  Explanation       PT Short Term Goals - 07/09/19 0931      PT SHORT TERM GOAL #1   Title  Pt will have understanding to utilize stretches throughout the day to decrease spasm/tightness    Status  Achieved      PT SHORT TERM GOAL #2   Title  LE strength to 5/5    Baseline  see flowsheet    Status  On-going      PT SHORT TERM GOAL #3   Title  Pt will be able to bend forward to  lift light <=10lb comfortably    Status  Achieved        PT Long Term Goals - 07/31/19 1025      PT LONG TERM GOAL #1   Title  Pt will be able to perform proper hip hinge for lifting and squatting technique    Baseline  able to demo    Status  Achieved      PT LONG TERM GOAL #2   Title  Pt will demo 5/5 hip strength    Status  Achieved      PT LONG TERM GOAL #3   Title  Pt will verbalize resolution of legs "giving out" with fatigue    Baseline  it only happens if I do a lot for a few days and I get the burning feeling (soreness) in my muscles    Status  Achieved            Plan - 07/31/19 1048    Clinical Impression Statement  Pt has met all of his goals to address LBP and is prepared for d/c to independent program. continues to be limited by Lt UE and upper back and plans to address this with MD. Describes a spasm sensation on the right side of his chest that is accompanied by increased HR and having a hard time catching his breath. Describes a "ball" that comes from between his ribs but says it goes away. I suggested he keep a journal of activity & food  around the time of episodes to show his PCP. Encouraged him to contact us with any further questions.    PT Treatment/Interventions  ADLs/Self Care Home Management;Cryotherapy;Electrical Stimulation;Traction;Moist Heat;Functional mobility training;Therapeutic activities;Therapeutic exercise;Patient/family education;Neuromuscular re-education;Manual techniques;Passive range of motion;Dry needling;Spinal Manipulations;Taping;Joint Manipulations    PT Home Exercise Plan  child pose, hamstring stretch, quad stretch, gastroc stretch; UE horiz abd; bird dog, hip hinge+squat, triceps kick, squat dead lift, single leg hinge&reach, supine dead bug extension & scissors, half kneeling chop & forward lean, SL rainbows, SL hip ER    Consulted and Agree with Plan of Care  Patient       Patient will benefit from skilled therapeutic intervention in order to improve the following deficits and impairments:  Difficulty walking, Impaired UE functional use, Increased muscle spasms, Decreased activity tolerance, Pain, Improper body mechanics, Impaired flexibility, Hypomobility, Decreased strength, Postural dysfunction  Visit Diagnosis: Chronic bilateral low back pain without sciatica  Difficulty in walking, not elsewhere classified     Problem List Patient Active Problem List   Diagnosis Date Noted  . Back pain 06/05/2017  . Encounter for immunization 07/08/2016  . Adjustment disorder with emotional disturbance 06/28/2014  . Health care maintenance 01/03/2013  . Alcohol use 01/03/2013  . Schizoaffective disorder, bipolar type ([place]) 03/16/2007    PHYSICAL THERAPY DISCHARGE SUMMARY  Visits from Start of Care: 9  Current functional level related to goals / functional outcomes: See above   Remaining deficits: See above   Education / Equipment: Anatomy of condition, POC, HEP, exercise form/rationale  Plan: Patient agrees to discharge.  Patient goals were met. Patient is being discharged due to meeting  the stated rehab goals.  ?????     Cici Rodriges C. Edmar Blankenburg PT, DPT 07/31/19 10:54 AM   [facility] [address] [place], [place], [address] Phone: [phone]   Fax:  [phone]  Name: Shawn Leach MRN: [id number] Date of Birth: [birth year]

## 2019-08-02 ENCOUNTER — Ambulatory Visit: Payer: Medicaid Other | Admitting: Physical Therapy

## 2019-08-07 ENCOUNTER — Ambulatory Visit: Payer: Medicaid Other | Admitting: Physical Therapy

## 2019-08-09 ENCOUNTER — Ambulatory Visit: Payer: Medicaid Other | Admitting: Physical Therapy

## 2019-08-14 ENCOUNTER — Ambulatory Visit (INDEPENDENT_AMBULATORY_CARE_PROVIDER_SITE_OTHER): Payer: Medicaid Other | Admitting: Psychiatry

## 2019-08-14 ENCOUNTER — Ambulatory Visit: Payer: Medicaid Other | Admitting: Physical Therapy

## 2019-08-14 ENCOUNTER — Encounter (HOSPITAL_COMMUNITY): Payer: Self-pay | Admitting: Psychiatry

## 2019-08-14 ENCOUNTER — Other Ambulatory Visit: Payer: Self-pay

## 2019-08-14 DIAGNOSIS — Z79899 Other long term (current) drug therapy: Secondary | ICD-10-CM | POA: Diagnosis not present

## 2019-08-14 DIAGNOSIS — F250 Schizoaffective disorder, bipolar type: Secondary | ICD-10-CM

## 2019-08-14 DIAGNOSIS — F5104 Psychophysiologic insomnia: Secondary | ICD-10-CM

## 2019-08-14 MED ORDER — DIVALPROEX SODIUM ER 500 MG PO TB24: 1000.0000 mg | ORAL_TABLET | Freq: Every day | ORAL | 0 refills | Status: DC

## 2019-08-14 MED ORDER — TRAZODONE HCL 50 MG PO TABS: 50.0000 mg | ORAL_TABLET | Freq: Every evening | ORAL | 0 refills | Status: DC | PRN

## 2019-08-14 NOTE — Progress Notes (Signed)
Virtual Visit via Telephone Note  I connected with Shawn Leach on 08/14/19 at 11:40 AM EDT by telephone and verified that I am speaking with the correct person using two identifiers.   I discussed the limitations, risks, security and privacy concerns of performing an evaluation and management service by telephone and the availability of in person appointments. I also discussed with the patient that there may be a patient responsible charge related to this service. The patient expressed understanding and agreed to proceed.   History of Present Illness: Patient was evaluated by phone session.  He is taking Depakote 1000 mg every day which is helping his mood, irritability and anger.  He is taking trazodone as needed for insomnia.  He feels that trazodone is helping his sleep but he does not need to take it every night.  He still have residual paranoia and sometimes hallucinations but they are not as intense and frequent.  He admitted occasional drinking and smoking marijuana but since taking the medication he has significantly cut down.  He was doing physical therapy for his left shoulder and he is feeling better but he may need more therapy.  Denies any severe mood swings, agitation, irritability but he still talks to himself when he hears voices.  He could not elaborate the voices very well because he believes they are mumbling.  He does not feel he need to see a therapist.  He was seeing Rich Brave but since taking the medication regularly he is doing fine.  His energy level is good.  His appetite is okay.  He lives with his wife and 4 children.  He has no tremors, shakes or any EPS.    Past Psychiatric History:Reviewed H/Opsychiatric illness since youngage. H/Osuicidal thoughts, severe anger, mania and hallucination. Sawpsychiatrist at the local mental health agency which closed.H/O onebrief stay in the emergency room when intoxicated with alcohol.TookSeroquel(groggy and tired).No  h/osuicidal attempt,nightmares flashback or OCD symptoms.   Psychiatric Specialty Exam: Physical Exam  ROS  There were no vitals taken for this visit.There is no height or weight on file to calculate BMI.  General Appearance: NA  Eye Contact:  NA  Speech:  Clear and Coherent and fast  Volume:  Normal  Mood:  Euthymic  Affect:  NA  Thought Process:  Goal Directed  Orientation:  Full (Time, Place, and Person)  Thought Content:  Paranoid Ideation and Rumination  Suicidal Thoughts:  No  Homicidal Thoughts:  No  Memory:  Immediate;   Fair Recent;   Fair Remote;   Fair  Judgement:  Good  Insight:  Good  Psychomotor Activity:  NA  Concentration:  Concentration: Fair and Attention Span: Fair  Recall:  [employer] of Knowledge:  Good  Language:  Good  Akathisia:  No  Handed:  Right  AIMS (if indicated):     Assets:  Communication Skills Desire for Improvement Housing Resilience  ADL's:  Intact  Cognition:  WNL  Sleep:   fair      Assessment and Plan: Schizoaffective disorder, bipolar type.  Insomnia.  Patient is a stable on his medication.  He takes trazodone as needed.  I recommend to continue Depakote 1000 mg at bedtime.  We will do blood work including CBC, CMP, hemoglobin A1c, TSH and valproic acid level.  Recommended to call us back if he has any question or any concern.  Follow-up in 3 months.  Follow Up Instructions:    I discussed the assessment and treatment plan with the  patient. The patient was provided an opportunity to ask questions and all were answered. The patient agreed with the plan and demonstrated an understanding of the instructions.   The patient was advised to call back or seek an in-person evaluation if the symptoms worsen or if the condition fails to improve as anticipated.  I provided 20 minutes of non-face-to-face time during this encounter.   Kathlee Nations, MD

## 2019-08-15 ENCOUNTER — Other Ambulatory Visit (HOSPITAL_COMMUNITY): Payer: Self-pay

## 2019-08-15 DIAGNOSIS — Z79899 Other long term (current) drug therapy: Secondary | ICD-10-CM

## 2019-08-16 ENCOUNTER — Ambulatory Visit: Payer: Medicaid Other | Admitting: Physical Therapy

## 2019-08-16 LAB — CBC WITH DIFFERENTIAL/PLATELET
Basophils Absolute: 0 10*3/uL (ref 0.0–0.2)
Basos: 1 %
EOS (ABSOLUTE): 0.2 10*3/uL (ref 0.0–0.4)
Eos: 4 %
Hematocrit: 43.5 % (ref 37.5–51.0)
Hemoglobin: 14.5 g/dL (ref 13.0–17.7)
Immature Grans (Abs): 0 10*3/uL (ref 0.0–0.1)
Immature Granulocytes: 0 %
Lymphocytes Absolute: 2.1 10*3/uL (ref 0.7–3.1)
Lymphs: 39 %
MCH: 29.5 pg (ref 26.6–33.0)
MCHC: 33.3 g/dL (ref 31.5–35.7)
MCV: 88 fL (ref 79–97)
Monocytes Absolute: 0.6 10*3/uL (ref 0.1–0.9)
Monocytes: 12 %
Neutrophils Absolute: 2.4 10*3/uL (ref 1.4–7.0)
Neutrophils: 44 %
Platelets: 249 10*3/uL (ref 150–450)
RBC: 4.92 x10E6/uL (ref 4.14–5.80)
RDW: 12.9 % (ref 11.6–15.4)
WBC: 5.3 10*3/uL (ref 3.4–10.8)

## 2019-08-16 LAB — COMPREHENSIVE METABOLIC PANEL
ALT: 13 IU/L (ref 0–44)
AST: 30 IU/L (ref 0–40)
Albumin/Globulin Ratio: 1.4 (ref 1.2–2.2)
Albumin: 4.5 g/dL (ref 4.0–5.0)
Alkaline Phosphatase: 48 IU/L (ref 39–117)
BUN/Creatinine Ratio: 13 (ref 9–20)
BUN: 14 mg/dL (ref 6–20)
Bilirubin Total: 0.6 mg/dL (ref 0.0–1.2)
CO2: 24 mmol/L (ref 20–29)
Calcium: 9.5 mg/dL (ref 8.7–10.2)
Chloride: 98 mmol/L (ref 96–106)
Creatinine, Ser: 1.09 mg/dL (ref 0.76–1.27)
GFR calc Af Amer: 100 mL/min/{1.73_m2} (ref >59)
GFR calc non Af Amer: 87 mL/min/{1.73_m2} (ref >59)
Globulin, Total: 3.2 g/dL (ref 1.5–4.5)
Glucose: 111 mg/dL — ABNORMAL HIGH (ref 65–99)
Potassium: 3.9 mmol/L (ref 3.5–5.2)
Sodium: 136 mmol/L (ref 134–144)
Total Protein: 7.7 g/dL (ref 6.0–8.5)

## 2019-08-16 LAB — TSH: TSH: 1.51 u[IU]/mL (ref 0.450–4.500)

## 2019-08-16 LAB — VALPROIC ACID LEVEL: Valproic Acid Lvl: 12 ug/mL — ABNORMAL LOW (ref 50–100)

## 2019-08-21 ENCOUNTER — Ambulatory Visit: Payer: Medicaid Other | Admitting: Physical Therapy

## 2019-08-23 ENCOUNTER — Encounter: Payer: Medicaid Other | Admitting: Physical Therapy

## 2019-10-30 ENCOUNTER — Ambulatory Visit (HOSPITAL_COMMUNITY): Payer: Medicaid Other | Admitting: Psychiatry

## 2019-10-30 ENCOUNTER — Other Ambulatory Visit: Payer: Self-pay

## 2019-12-05 ENCOUNTER — Ambulatory Visit: Payer: Medicaid Other | Admitting: Internal Medicine

## 2019-12-05 ENCOUNTER — Other Ambulatory Visit: Payer: Self-pay

## 2019-12-05 ENCOUNTER — Encounter: Payer: Self-pay | Admitting: Internal Medicine

## 2019-12-05 DIAGNOSIS — K3189 Other diseases of stomach and duodenum: Secondary | ICD-10-CM | POA: Diagnosis present

## 2019-12-05 DIAGNOSIS — R1012 Left upper quadrant pain: Secondary | ICD-10-CM | POA: Diagnosis not present

## 2019-12-05 DIAGNOSIS — R109 Unspecified abdominal pain: Secondary | ICD-10-CM | POA: Insufficient documentation

## 2019-12-05 HISTORY — DX: Unspecified abdominal pain: R10.9

## 2019-12-05 NOTE — Patient Instructions (Signed)
Thank you for allowing [place] to provide your care. I think you are likely experiencing abdominal wall cramps. Try to stay hydrated and cut back on your alcohol use. You can also taking over-the-counter magnesium supplement to see if that will help.  Muscle Cramps and Spasms Muscle cramps and spasms are when muscles tighten by themselves. They usually get better within minutes. Muscle cramps are painful. They are usually stronger and last longer than muscle spasms. Muscle spasms may or may not be painful. They can last a few seconds or much longer. Cramps and spasms can affect any muscle, but they occur most often in the calf muscles of the leg. They are usually not caused by a serious problem. In many cases, the cause is not known. Some common causes include:  Doing more physical work or exercise than your body is ready for.  Using the muscles too much (overuse) by repeating certain movements too many times.  Staying in a certain position for a long time.  Playing a sport or doing an activity without preparing properly.  Using bad form or technique while playing a sport or doing an activity.  Not having enough water in your body (dehydration).  Injury.  Side effects of some medicines.  Low levels of the salts and minerals in your blood (electrolytes), such as low potassium or calcium. Follow these instructions at home: Managing pain and stiffness      Massage, stretch, and relax the muscle. Do this for many minutes at a time.  If told, put heat on tight or tense muscles as often as told by your doctor. Use the heat source that your doctor recommends, such as a moist heat pack or a heating pad. ? Place a towel between your skin and the heat source. ? Leave the heat on for 20-30 minutes. ? Remove the heat if your skin turns bright red. This is very important if you are not able to feel pain, heat, or cold. You may have a greater risk of getting burned.  If told, put ice on the affected  area. This may help if you are sore or have pain after a cramp or spasm. ? Put ice in a plastic bag. ? Place a towel between your skin and the bag. ? Leave the ice on for 20 minutes, 2-3 times a day.  Try taking hot showers or baths to help relax tight muscles. Eating and drinking  Drink enough fluid to keep your pee (urine) pale yellow.  Eat a healthy diet to help ensure that your muscles work well. This should include: ? Fruits and vegetables. ? Lean protein. ? Whole grains. ? Low-fat or nonfat dairy products. General instructions  If you are having cramps often, avoid intense exercise for several days.  Take over-the-counter and prescription medicines only as told by your doctor.  Watch for any changes in your symptoms.  Keep all follow-up visits as told by your doctor. This is important. Contact a doctor if:  Your cramps or spasms get worse or happen more often.  Your cramps or spasms do not get better with time. Summary  Muscle cramps and spasms are when muscles tighten by themselves. They usually get better within minutes.  Cramps and spasms occur most often in the calf muscles of the leg.  Massage, stretch, and relax the muscle. This may help the cramp or spasm go away.  Drink enough fluid to keep your pee (urine) pale yellow. This information is not intended to replace advice  given to you by your health care provider. Make sure you discuss any questions you have with your health care provider. Document Revised: 03/27/2018 Document Reviewed: 03/27/2018 Elsevier Patient Education  2020 [employer].

## 2019-12-05 NOTE — Assessment & Plan Note (Signed)
Patient presents with one year history of intermittent abdominal pain. He states that he develops it not in his left upper quadrant anytime he exerts himself. This primarily occurs when he is sweeping, mopping, or picking up boxes. He will have this feeling better not is developing, this will progress to abrupt pain. He will have to stop his activities and raise his arms above his head to stretch his abdomen which will relieve the pain. He does not have similar occurrences at rest. This only occurs intermittently. He has not tried anything for it. He does try to stay hydrated. He does drink a couple beers and a couple shots of tequila per day. He is not noticed any other aggravating or alleviating factors. He does occasionally have cramps in his legs and his right foot.  A/P: - Discussed that his symptoms sound consistent with an abdominal wall spasm/cramp. Advised for hydration and limiting alcohol intake. I reviewed his previous labs that showed normal calcium and potassium.

## 2019-12-05 NOTE — Progress Notes (Signed)
   CC: Abdominal pain  HPI:  [name] is a 39 y.o. male with PMHx listed below presenting for abdominal pain. Please see the A&P for the status of the patient's chronic medical problems.  Past Medical History:  Diagnosis Date  . Bipolar 1 disorder ([facility])   . Bradycardia    asymptomatic, normal EKG and BMET  . Gonococcal urethritis    hx in 5/03  . High risk homosexual behavior    Hx of relationship with man 2002-2005, no known disease in the partnet  . History of psychiatric disorder    unknown prior diagnosis  . Schizoaffective disorder, unspecified type ([facility]) 03/16/2007   Diagnosed since childhood - teenage years Has been on medications including Depakote and Seroquel since then  Symptoms are stable but on disability due to this mental d/o  Late 2014: Fired from [employer] due to aggressive behavior 2015 Started attending [facility] in [place]     . Schizophrenia ([facility])   . Shoulder weakness 02/23/2018  . Tympanic membrane rupture    history of   Review of Systems:  Performed and all others negative.  Physical Exam: Vitals:   12/05/19 1015  BP: 118/75  Pulse: (!) 58  Temp: 98.1 F (36.7 C)  TempSrc: Oral  SpO2: 99%  Weight: 137 lb 3.2 oz (62.2 kg)   General: Well nourished male in no acute distress Pulm: Good air movement with no wheezing or crackles  CV: RRR, no murmurs, no rubs   Assessment & Plan:   See Encounters Tab for problem based charting.  Patient discussed with Dr. Rogelia Boga

## 2019-12-07 NOTE — Progress Notes (Signed)
Internal Medicine Clinic Attending  Case discussed with Dr. Helberg at the time of the visit.  We reviewed the resident's history and exam and pertinent patient test results.  I agree with the assessment, diagnosis, and plan of care documented in the resident's note.    

## 2020-01-31 ENCOUNTER — Encounter: Payer: Self-pay | Admitting: Internal Medicine

## 2020-01-31 ENCOUNTER — Ambulatory Visit: Payer: Medicaid Other | Admitting: Internal Medicine

## 2020-01-31 VITALS — BP 115/69 | HR 90 | Temp 98.1°F | Ht 65.0 in | Wt 137.2 lb

## 2020-01-31 DIAGNOSIS — M549 Dorsalgia, unspecified: Secondary | ICD-10-CM

## 2020-01-31 DIAGNOSIS — R109 Unspecified abdominal pain: Secondary | ICD-10-CM

## 2020-01-31 DIAGNOSIS — F250 Schizoaffective disorder, bipolar type: Secondary | ICD-10-CM

## 2020-01-31 DIAGNOSIS — M62838 Other muscle spasm: Secondary | ICD-10-CM

## 2020-01-31 DIAGNOSIS — Z79899 Other long term (current) drug therapy: Secondary | ICD-10-CM

## 2020-01-31 DIAGNOSIS — M545 Low back pain, unspecified: Secondary | ICD-10-CM

## 2020-01-31 NOTE — Assessment & Plan Note (Signed)
Patient states his back pain has not been bothering him very much recently. Will continue to monitor.

## 2020-01-31 NOTE — Assessment & Plan Note (Addendum)
Patient present for follow up of his intermittent muscle spasms in his abdominal wall. He was seen previously and advised to stay hydrated and avoid excessive alcohol use. The spasm continue to occur several times a week and are associated with activity. He is able to relieve the pain by stretching and rubbing the the are where he feels a "knot" (his left upper abdominal musculature). His symptoms have improved somewhat from his last visit. As noted previously, his labs done in the past 6 months showed normal electrolytes and renal function.  He is advised again to stay hydrated and avoid excessive alcohol intake. I have also recommended he consider some morning or pre-work stretches to help prevent the spasms; switching arms/side when he is working to avoid overuse of one muscle group; and drinking Gatorade or similar on particularly hot or busy days.

## 2020-01-31 NOTE — Assessment & Plan Note (Signed)
Patient continues to follow with Behavioral Health. He has tolerated his medications well. He states he missed one appointment and has been calling to get a new appointment, but thinks he may have the wrong number. He was encouraged to to continue contact them and I also sent an epic message to his most recent provider there to help get him scheduled. - Follow up with Behavioral health - Depakote 500mg  Daily - Trazodone 100mg  qhs

## 2020-01-31 NOTE — Patient Instructions (Addendum)
Thank you for allowing [place] to care for you  For your abdominal pain/spasm - Continue with stretching to relieve pain - As noted previously your lab work looks good - Stay well hydrated - Consider morning stretches and Gatorade or similar on days where you are working  Follow up in about 6 months

## 2020-01-31 NOTE — Progress Notes (Signed)
   CC: Abdominal spasm, Back pain, Schizoaffective disorder  HPI:  Shawn Leach is a 39 y.o. M with PMHx listed below presenting for Abdominal spasm, Back pain, Schizoaffective disorder. Please see the A&P for the status of the patient's chronic medical problems.  Past Medical History:  Diagnosis Date  . Bipolar 1 disorder (HCC)   . Bradycardia    asymptomatic, normal EKG and BMET  . Gonococcal urethritis    hx in 5/03  . High risk homosexual behavior    Hx of relationship with man 2002-2005, no known disease in the partnet  . History of psychiatric disorder    unknown prior diagnosis  . Schizoaffective disorder, unspecified type (HCC) 03/16/2007   Diagnosed since childhood - teenage years Has been on medications including Depakote and Seroquel since then  Symptoms are stable but on disability due to this mental d/o  Late 2014: Fired from [employer] due to aggressive behavior 2015 Started attending [facility] in [place]     . Schizophrenia (HCC)   . Shoulder weakness 02/23/2018  . Tympanic membrane rupture    history of   Review of Systems:  Performed and all others negative.  Physical Exam:  Vitals:   01/31/20 1354  BP: 115/69  Pulse: 90  Temp: 98.1 F (36.7 C)  TempSrc: Oral  SpO2: 95%  Weight: 137 lb 3.2 oz (62.2 kg)  Height: 5\' 5"  (1.651 m)   Physical Exam Constitutional:      General: He is not in acute distress.    Appearance: Normal appearance.  Cardiovascular:     Rate and Rhythm: Normal rate and regular rhythm.     Pulses: Normal pulses.     Heart sounds: Normal heart sounds.  Pulmonary:     Effort: Pulmonary effort is normal. No respiratory distress.     Breath sounds: Normal breath sounds.  Abdominal:     General: Bowel sounds are normal. There is no distension.     Palpations: Abdomen is soft.     Tenderness: There is no abdominal tenderness.  Musculoskeletal:        General: No swelling or deformity.  Skin:    General: Skin is warm and dry.   Neurological:     General: No focal deficit present.     Mental Status: Mental status is at baseline.    Assessment & Plan:   See Encounters Tab for problem based charting.  Patient discussed with Dr. 

## 2020-02-04 ENCOUNTER — Other Ambulatory Visit: Payer: Self-pay

## 2020-02-04 ENCOUNTER — Encounter (HOSPITAL_COMMUNITY): Payer: Self-pay | Admitting: Psychiatry

## 2020-02-04 ENCOUNTER — Ambulatory Visit (INDEPENDENT_AMBULATORY_CARE_PROVIDER_SITE_OTHER): Payer: Medicaid Other | Admitting: Psychiatry

## 2020-02-04 DIAGNOSIS — F250 Schizoaffective disorder, bipolar type: Secondary | ICD-10-CM | POA: Diagnosis not present

## 2020-02-04 DIAGNOSIS — F5104 Psychophysiologic insomnia: Secondary | ICD-10-CM | POA: Diagnosis not present

## 2020-02-04 DIAGNOSIS — F1210 Cannabis abuse, uncomplicated: Secondary | ICD-10-CM

## 2020-02-04 MED ORDER — TRAZODONE HCL 50 MG PO TABS: 50.0000 mg | ORAL_TABLET | Freq: Every evening | ORAL | 0 refills | Status: DC | PRN

## 2020-02-04 MED ORDER — DIVALPROEX SODIUM ER 500 MG PO TB24: 1000.0000 mg | ORAL_TABLET | Freq: Every day | ORAL | 0 refills | Status: DC

## 2020-02-04 NOTE — Progress Notes (Signed)
Virtual Visit via Telephone Note  I connected with Shawn Leach on 02/04/20 at  8:20 AM EDT by telephone and verified that I am speaking with the correct person using two identifiers.   I discussed the limitations, risks, security and privacy concerns of performing an evaluation and management service by telephone and the availability of in person appointments. I also discussed with the patient that there may be a patient responsible charge related to this service. The patient expressed understanding and agreed to proceed.   History of Present Illness: Patient was evaluated by phone session.  He was last evaluated in September 2020.  He admitted missing appointment and apologized for back.  I reviewed his blood work.  His Depakote level is 12.  He admitted some time he does not take his medication as prescribed.  However when he do remember he does take and that helps his sleep and irritability.  Denies any severe anger, mood swing, mania elevated sometimes feels paranoid and anxious.  He is referred to chiropractic for his back and shoulder pain.  Denies any hallucination but sometimes he feels paranoid.  He also takes trazodone only when he cannot sleep well.  His wife also takes sleep medicine and will try to avoid taking more people taking medication because they have children.  He is not in therapy with therapists but like to schedule and restart therapy.  His previous therapist has moved to a different place.  He admitted some time smoke marijuana but try to minimize and avoid that discussion.  He reported his energy level is okay.  His appetite is fine.  He has no tremors, shakes or any EPS.  He lives with his wife and 4 children.  He is on disability.   Past Psychiatric History:Reviewed H/Opsychiatric illness since youngage. H/Osuicidal thoughts, severe anger, mania and hallucination. Sawpsychiatrist at the local mental health agency which closed.H/O onebrief stay in the emergency  room when intoxicated with alcohol.TookSeroquel(groggy and tired).No h/osuicidal attempt,nightmares flashback or OCD symptoms.  No results found for this or any previous visit (from the past 2160 hour(s)).   Psychiatric Specialty Exam: Physical Exam  Review of Systems  There were no vitals taken for this visit.There is no height or weight on file to calculate BMI.  General Appearance: NA  Eye Contact:  NA  Speech:  fast  Volume:  Increased  Mood:  Euthymic  Affect:  NA  Thought Process:  Descriptions of Associations: Intact  Orientation:  Full (Time, Place, and Person)  Thought Content:  Paranoid Ideation and Rumination  Suicidal Thoughts:  No  Homicidal Thoughts:  No  Memory:  Immediate;   Fair Recent;   Fair Remote;   Fair  Judgement:  Fair  Insight:  Fair  Psychomotor Activity:  NA  Concentration:  Concentration: Fair and Attention Span: Fair  Recall:  Good  Fund of Knowledge:  Fair  Language:  Good  Akathisia:  No  Handed:  Right  AIMS (if indicated):     Assets:  Communication Skills Desire for Improvement Housing Social Support  ADL's:  Intact  Cognition:  WNL  Sleep:   fair      Assessment and Plan: Schizoaffective disorder, bipolar type.  Cannabis use.  Primary insomnia.  Reinforced to take the medication as prescribed and keep appointment for better efficacy.  Discussed blood work results with him.  He has low Depakote level which contributed to his poorly compliant with medication.  He promised that he will keep the medication  compliant and he like to schedule appointment because he is thinking to restart counseling.  We also discussed stopping cannabis use and he promised that he will work on it.  I encouraged that take the trazodone when he cannot sleep and he agreed with the plan.  Discussed medication side effects and benefits.  Recommended to call us back if he has any question, concern or if he feels worsening of the symptom.  Time spent 25  minutes.  Follow-up in 3 months.  Follow Up Instructions:    I discussed the assessment and treatment plan with the patient. The patient was provided an opportunity to ask questions and all were answered. The patient agreed with the plan and demonstrated an understanding of the instructions.   The patient was advised to call back or seek an in-person evaluation if the symptoms worsen or if the condition fails to improve as anticipated.  I provided 25 minutes of non-face-to-face time during this encounter.   Cleotis Nipper, MD

## 2020-02-05 NOTE — Progress Notes (Signed)
Internal Medicine Clinic Attending  Case discussed with Dr. Melvin  at the time of the visit.  We reviewed the resident's history and exam and pertinent patient test results.  I agree with the assessment, diagnosis, and plan of care documented in the resident's note.  

## 2020-03-14 ENCOUNTER — Encounter: Payer: Self-pay | Admitting: *Deleted

## 2020-05-05 ENCOUNTER — Other Ambulatory Visit: Payer: Self-pay

## 2020-05-05 ENCOUNTER — Telehealth (INDEPENDENT_AMBULATORY_CARE_PROVIDER_SITE_OTHER): Payer: Medicaid Other | Admitting: Psychiatry

## 2020-05-05 ENCOUNTER — Encounter (HOSPITAL_COMMUNITY): Payer: Self-pay | Admitting: Psychiatry

## 2020-05-05 VITALS — Wt 132.0 lb

## 2020-05-05 DIAGNOSIS — F250 Schizoaffective disorder, bipolar type: Secondary | ICD-10-CM | POA: Diagnosis not present

## 2020-05-05 DIAGNOSIS — F1210 Cannabis abuse, uncomplicated: Secondary | ICD-10-CM

## 2020-05-05 MED ORDER — DIVALPROEX SODIUM ER 500 MG PO TB24: 1000.0000 mg | ORAL_TABLET | Freq: Every day | ORAL | 0 refills | Status: DC

## 2020-05-05 NOTE — Progress Notes (Signed)
Virtual Visit via Telephone Note  I connected with Shawn Leach on 05/05/20 at  8:20 AM EDT by telephone and verified that I am speaking with the correct person using two identifiers.   I discussed the limitations, risks, security and privacy concerns of performing an evaluation and management service by telephone and the availability of in person appointments. I also discussed with the patient that there may be a patient responsible charge related to this service. The patient expressed understanding and agreed to proceed.  Patient location; home Provider location; home office   History of Present Illness: Patient is evaluated by phone session.  He is taking his Depakote every day and he noticed his paranoia and irritability is much better.  He is sleeping good and does not take the trazodone.  He is trying to keep himself busy doing home projects.  He does not go outside but does feel more comfortable when he takes the trazodone.  He had cut down his cannabis use and usually smoke once a week.  He does not really need the therapy.  His energy level is okay.  His appetite is okay.  He denies any hallucination or any suicidal thoughts.  He has no tremors or shakes.  He lives with his wife and 4 children.  He is on disability.  He like to keep his current medication but does not need trazodone.  Past Psychiatric History:Reviewed H/Opsychiatric illness since youngage. H/Osuicidal thoughts, severe anger, mania and hallucination. Sawpsychiatrist at the local mental health agency which closed.H/O onebrief stay in the emergency room when intoxicated with alcohol.TookSeroquel(groggy and tired).No h/osuicidal attempt,nightmares flashback or OCD symptoms.    Psychiatric Specialty Exam: Physical Exam  Review of Systems  Weight 132 lb (59.9 kg).There is no height or weight on file to calculate BMI.  General Appearance: NA  Eye Contact:  NA  Speech:  Clear and Coherent and fast   Volume:  Normal  Mood:  Euthymic  Affect:  NA  Thought Process:  Descriptions of Associations: Intact  Orientation:  Full (Time, Place, and Person)  Thought Content:  Paranoid Ideation  Suicidal Thoughts:  No  Homicidal Thoughts:  No  Memory:  Immediate;   Good Recent;   Fair Remote;   Fair  Judgement:  Intact  Insight:  Fair  Psychomotor Activity:  NA  Concentration:  Concentration: Fair and Attention Span: Fair  Recall:  [employer] of Knowledge:  Good  Language:  Good  Akathisia:  No  Handed:  Right  AIMS (if indicated):     Assets:  Communication Skills Desire for Improvement Housing Social Support  ADL's:  Intact  Cognition:  WNL  Sleep:   good      Assessment and Plan: Schizoaffective disorder, bipolar type.  Cannabis use.  Patient is not taking trazodone and is sleeping better.  He is also more consistent with Depakote and he feels the medicine is working.  Continue Depakote 1000 mg daily.  He does not need trazodone.  Encouraged to stop the cannabis use.  Recommended to call us back if is any question or any concern.  Follow-up in 3 months.  Follow Up Instructions:    I discussed the assessment and treatment plan with the patient. The patient was provided an opportunity to ask questions and all were answered. The patient agreed with the plan and demonstrated an understanding of the instructions.   The patient was advised to call back or seek an in-person evaluation if the symptoms worsen or  if the condition fails to improve as anticipated.  I provided 15 minutes of non-face-to-face time during this encounter.   Cleotis Nipper, MD

## 2020-08-06 ENCOUNTER — Other Ambulatory Visit: Payer: Self-pay

## 2020-08-06 ENCOUNTER — Telehealth (INDEPENDENT_AMBULATORY_CARE_PROVIDER_SITE_OTHER): Payer: Medicaid Other | Admitting: Psychiatry

## 2020-08-06 ENCOUNTER — Encounter (HOSPITAL_COMMUNITY): Payer: Self-pay | Admitting: Psychiatry

## 2020-08-06 VITALS — Wt 135.0 lb

## 2020-08-06 DIAGNOSIS — F250 Schizoaffective disorder, bipolar type: Secondary | ICD-10-CM

## 2020-08-06 DIAGNOSIS — F1210 Cannabis abuse, uncomplicated: Secondary | ICD-10-CM

## 2020-08-06 MED ORDER — DIVALPROEX SODIUM ER 500 MG PO TB24: 1000.0000 mg | ORAL_TABLET | Freq: Every day | ORAL | 0 refills | Status: DC

## 2020-08-06 NOTE — Progress Notes (Signed)
Virtual Visit via Telephone Note  I connected with Shawn Leach on 08/06/20 at  8:20 AM EDT by telephone and verified that I am speaking with the correct person using two identifiers.  Location: Patient: In Car Provider: Home Office   I discussed the limitations, risks, security and privacy concerns of performing an evaluation and management service by telephone and the availability of in person appointments. I also discussed with the patient that there may be a patient responsible charge related to this service. The patient expressed understanding and agreed to proceed.   History of Present Illness: Patient is evaluated by phone session.  He is compliant with Depakote and he feels it is helping his paranoia, irritability, mood swings.  He does not take trazodone anymore since he sleeps better.  He does not want to be in deep sleep because he has small kids and sometimes they need help.  Patient is on disability.  He still smoke marijuana on and off but gradually he had cut down.  We have recommended blood work but he has not able to have visit with PCP but he has upcoming appointment in few weeks.  He does not remember the doctor's name but he has a number so he can call to confirm his appointment.  Denies any tremors, shakes or any EPS.  His appetite is okay.  His weight is stable.  Denies any mania, hallucination or any anger.  He like to keep his current Depakote.   Past Psychiatric History:Reviewed H/Opsychiatric illness since youngage. H/Osuicidal thoughts, severe anger, mania and hallucination. Sawpsychiatrist at the local mental health agency which closed.H/O onebrief stay in the emergency room when intoxicated with alcohol.TookSeroquel(groggy and tired).No h/osuicidal attempt,nightmares flashback or OCD symptoms.  Psychiatric Specialty Exam: Physical Exam  Review of Systems  There were no vitals taken for this visit.There is no height or weight on file to calculate  BMI.  General Appearance: NA  Eye Contact:  NA  Speech:  Normal Rate  Volume:  Normal  Mood:  Euthymic  Affect:  NA  Thought Process:  Descriptions of Associations: Intact  Orientation:  Full (Time, Place, and Person)  Thought Content:  WDL  Suicidal Thoughts:  No  Homicidal Thoughts:  No  Memory:  Immediate;   Good Recent;   Fair Remote;   Fair  Judgement:  Intact  Insight:  Shallow  Psychomotor Activity:  NA  Concentration:  Concentration: Fair and Attention Span: Fair  Recall:  [employer] of Knowledge:  Fair  Language:  Fair  Akathisia:  No  Handed:  Right  AIMS (if indicated):     Assets:  Communication Skills Desire for Improvement Housing Social Support Transportation  ADL's:  Intact  Cognition:  WNL  Sleep:   ok      Assessment and Plan: Schizoaffective disorder, bipolar type.  Cannabis use.  Patient is taking his medication Depakote 1000 mg daily.  He reported it is helping him.  Reminded that he need a blood work and Depakote level.  Patient promised to have the blood work soon since he has upcoming appointment with PCP.  I recommended if he did not get blood work then we may need to order labs on his next appointment.  He agreed with the plan.  Continue Depakote 1000 mg at bedtime.  Recommended to call us back if is any question or any concern.  Follow-up in 3 months.  Follow Up Instructions:    I discussed the assessment and treatment plan with  the patient. The patient was provided an opportunity to ask questions and all were answered. The patient agreed with the plan and demonstrated an understanding of the instructions.   The patient was advised to call back or seek an in-person evaluation if the symptoms worsen or if the condition fails to improve as anticipated.  I provided 21 minutes of non-face-to-face time during this encounter.   Kathlee Nations, MD

## 2020-10-27 ENCOUNTER — Ambulatory Visit: Payer: Medicaid Other | Admitting: Internal Medicine

## 2020-10-27 ENCOUNTER — Ambulatory Visit (HOSPITAL_COMMUNITY)
Admission: RE | Admit: 2020-10-27 | Discharge: 2020-10-27 | Disposition: A | Discharge status: Home / Self Care | Payer: Medicaid Other | Source: Ambulatory Visit | Attending: Internal Medicine | Admitting: Internal Medicine

## 2020-10-27 ENCOUNTER — Other Ambulatory Visit: Payer: Self-pay

## 2020-10-27 ENCOUNTER — Encounter: Payer: Self-pay | Admitting: Internal Medicine

## 2020-10-27 VITALS — BP 117/84 | HR 66 | Temp 98.9°F | Ht 65.0 in | Wt 141.9 lb

## 2020-10-27 DIAGNOSIS — M130 Polyarthritis, unspecified: Secondary | ICD-10-CM

## 2020-10-27 DIAGNOSIS — M25551 Pain in right hip: Secondary | ICD-10-CM

## 2020-10-27 DIAGNOSIS — M25531 Pain in right wrist: Secondary | ICD-10-CM | POA: Diagnosis not present

## 2020-10-27 DIAGNOSIS — M25512 Pain in left shoulder: Secondary | ICD-10-CM | POA: Diagnosis not present

## 2020-10-27 NOTE — Progress Notes (Signed)
Acute Office Visit   Patient ID: Shawn Leach, male    DOB: 08/27/1981, 39 y.o.   MRN: [id number]  Subjective:  CC: wrist pain, shoulder pain, back pain  HPI 39 y.o. presents today for evaluation of 1w history of right pain. Pain is localized to the distal posterior aspect of the ulna. It does not radiate. It is exacerbated by wrist flexion and extension. No numbness or tingling. No known inciting events.   Interestingly, he does also note left shoulder pain associated with slight weakness which he has been seen for in the past. Denies radiation down the arm or numbness.  He also notes intermittent right hip pain. There has been no inciting event, prior trauma, or prior surgeries associated with these joint pains.  He is unaware of any personal or family history of autoimmune diseases however does note that his maternal grandmother and his mother both have bad arthritis. His joint pains are worse with cold weather. They are not worse in the morning.     ACTIVE MEDICATIONS   Outpatient Medications Prior to Visit  Medication Sig Dispense Refill  . cyclobenzaprine (FEXMID) 7.5 MG tablet Take 1 tablet (7.5 mg total) by mouth 3 (three) times daily as needed for muscle spasms. 60 tablet 0  . divalproex (DEPAKOTE ER) 500 MG 24 hr tablet Take 2 tablets (1,000 mg total) by mouth daily. 180 tablet 0  . traZODone (DESYREL) 50 MG tablet Take 1 tablet (50 mg total) by mouth at bedtime as needed for sleep. (Patient not taking: Reported on 05/05/2020) 30 tablet 0   No facility-administered medications prior to visit.     ROS  Review of Systems  Constitutional: Negative for chills and fever.  Eyes: Negative.   Respiratory: Negative.   Gastrointestinal: Negative.   Musculoskeletal: Positive for arthralgias and joint swelling.  Skin: Negative.     Objective:   BP 117/84 (BP Location: Left Arm, Patient Position: Sitting, Cuff Size: Normal)   Pulse 66   Temp 98.9 F (37.2 C) (Oral)    Ht $R'5\' 5"'HQ$  (1.651 m)   Wt 141 lb 14.4 oz (64.4 kg)   SpO2 99% Comment: room air  BMI 23.61 kg/m  Wt Readings from Last 3 Encounters:  10/27/20 141 lb 14.4 oz (64.4 kg)  01/31/20 137 lb 3.2 oz (62.2 kg)  12/05/19 137 lb 3.2 oz (62.2 kg)   BP Readings from Last 3 Encounters:  10/27/20 117/84  01/31/20 115/69  12/05/19 118/75   Physical Exam Constitutional:      Appearance: Normal appearance.  Eyes:     Conjunctiva/sclera: Conjunctivae normal.  Musculoskeletal:     Comments: Right wrist: increased size of the ulnar plateau. No obvious deformity. No joint effusion or erythema.  No pain on palpation of the wrist joint. Pain with passive flexion and extension. No pain on medial, lateral rotation or with internal/external rotation. Negative phalens and tinel's signs. 5/5 strength. Right hand: no obvious deformity. Slight enlargement of the 2nd and 3rd MCP joints.  Right elbow: normal exam Left shoulder: no obvious deformity. No pain on palpation of the shoulder. No significant findings on rotator cuff testing however pain is reproducible with empty can test. There is a very slight discrepency in muscle strength on the left compared with the right with deltoid strength testing.  Skin:    General: Skin is warm and dry.     Findings: No erythema or rash.     Health Maintenance:   Health Maintenance  Topic  Date Due  . COVID-19 Vaccine (1) Never done  . INFLUENZA VACCINE  06/15/2020  . TETANUS/TDAP  06/23/2026  . Hepatitis C Screening  Completed  . HIV Screening  Completed     Assessment & Plan:   Problem List Items Addressed This Visit      Musculoskeletal and Integument   Polyarthropathy    The encounter was for the evaluation of right wrist pain x1w. During our discussion, he also notes left should pain/weakness and right hip pain. See HPI for details. As there has been no inciting event to any of these joint pains, I find it abnormal for a gentleman of his age to have these  various joint issues. His poor health literacy may be impacting his ability to report family history of autoimmune processes although it is interesting that his mother and grandmother have "real bad" arthritis.   Plan: I do think it's worth investigating this further to r/o an autoimmune process such as ankylosing spondylitis or other rheumatoid autoimmune process.  -CRP, ESR to evaluate for elevation in inflammatory markers that would suggest autoimmune process -ANA with reflex, rheumatoid factor -CMP to check for liver function, TSH as this can cause a polyarthritis -Will check an AP pelvic xray to assess SI joints for sacroilitis -Will defer imaging of specific joints at this time as I have a low suspicion for a fracture. Can consider further imaging to assess joint space if above workup is unremarkable.        Other Visit Diagnoses    Polyarthritis    -  Primary   Relevant Orders   ANA, IFA (with reflex)   Sed Rate (ESR)   CRP (C-Reactive Protein)   Rheumatoid (RA) Factor   TSH   CMP14 + Anion Gap   DG Pelvis 1-2 Views        Pt discussed with Dr. Marcille Buffy, MD Internal Medicine Resident PGY-2 [facility] Pager: [phone] 10/27/2020 12:39 PM

## 2020-10-27 NOTE — Assessment & Plan Note (Addendum)
The encounter was for the evaluation of right wrist pain x1w. During our discussion, he also notes left should pain/weakness and right hip pain. See HPI for details. As there has been no inciting event to any of these joint pains, I find it abnormal for a gentleman of his age to have these various joint issues. His poor health literacy may be impacting his ability to report family history of autoimmune processes although it is interesting that his mother and grandmother have "real bad" arthritis.   Plan: I do think it's worth investigating this further to r/o an autoimmune process such as ankylosing spondylitis or other rheumatoid autoimmune process.  -CRP, ESR to evaluate for elevation in inflammatory markers that would suggest autoimmune process -ANA with reflex, rheumatoid factor -CMP to check for liver function, TSH as this can cause a polyarthritis -Will check an AP pelvic xray to assess SI joints for sacroilitis -Will defer imaging of specific joints at this time as I have a low suspicion for a fracture. Can consider further imaging to assess joint space if above workup is unremarkable.   Addendum: labs have returned and are unremarkable with makes an autoimmune process less likely. Pelvic radiograph did not reveal inflammation of the SI joint or any other abnormal finding. I discussed these results with the patient and plan for ongoing monitoring. Could consider fibromyalgia but would need a more focused exam prior to diagnosis.  10/29/20 1:20 PM

## 2020-10-28 ENCOUNTER — Encounter (HOSPITAL_COMMUNITY): Payer: Self-pay | Admitting: Psychiatry

## 2020-10-28 ENCOUNTER — Telehealth (INDEPENDENT_AMBULATORY_CARE_PROVIDER_SITE_OTHER): Payer: Medicaid Other | Admitting: Psychiatry

## 2020-10-28 ENCOUNTER — Other Ambulatory Visit (HOSPITAL_COMMUNITY): Payer: Self-pay | Admitting: *Deleted

## 2020-10-28 VITALS — Wt 141.0 lb

## 2020-10-28 DIAGNOSIS — F250 Schizoaffective disorder, bipolar type: Secondary | ICD-10-CM | POA: Diagnosis not present

## 2020-10-28 DIAGNOSIS — F1210 Cannabis abuse, uncomplicated: Secondary | ICD-10-CM | POA: Diagnosis not present

## 2020-10-28 MED ORDER — DIVALPROEX SODIUM ER 500 MG PO TB24
1000.0000 mg | ORAL_TABLET | Freq: Every day | ORAL | 0 refills | Status: DC
Start: 2020-10-28 — End: 2021-01-27

## 2020-10-28 NOTE — Progress Notes (Signed)
Virtual Visit via Telephone Note  I connected with Shawn Leach on 10/28/20 at  3:20 PM EST by telephone and verified that I am speaking with the correct person using two identifiers.  Location: Patient: home Provider: home office   I discussed the limitations, risks, security and privacy concerns of performing an evaluation and management service by telephone and the availability of in person appointments. I also discussed with the patient that there may be a patient responsible charge related to this service. The patient expressed understanding and agreed to proceed.   History of Present Illness: Patient is evaluated by phone session.  He is on the phone by himself.  He is taking Depakote and denies any recent paranoia, hallucination or any psychotic behavior.  He lives with his girlfriend.  He reported his relationship is going well.  He is still smoke marijuana on and off and he appears guarded about his amount of intake.  However he reported his sleep is good and he does not need any other medication.  Recently he had a blood work because he was complaining of joint pain.  He apologized not having Depakote level but promised that he would do it soon.  He has no tremors, shakes or any EPS.  His appetite is okay.  His weight is stable.  He denies any suicidal thoughts.   Past Psychiatric History:Reviewed H/Opsychiatric illness since youngage. H/Osuicidal thoughts, severe anger, mania and hallucination. Sawpsychiatrist at the local mental health agency which closed.H/O onebrief stay in the emergency room when intoxicated with alcohol.TookSeroquel(groggy and tired).No h/osuicidal attempt,nightmares flashback or OCD symptoms.  Recent Results (from the past 2160 hour(s))  ANA, IFA (with reflex)     Status: None (Preliminary result)   Collection Time: 10/27/20 11:06 AM  Result Value Ref Range   ANA Titer 1 WILL FOLLOW   Sed Rate (ESR)     Status: None   Collection Time:  10/27/20 11:06 AM  Result Value Ref Range   Sed Rate 9 0 - 15 mm/hr  CRP (C-Reactive Protein)     Status: None   Collection Time: 10/27/20 11:06 AM  Result Value Ref Range   CRP <1 0 - 10 mg/L  Rheumatoid (RA) Factor     Status: None   Collection Time: 10/27/20 11:06 AM  Result Value Ref Range   Rhuematoid fact SerPl-aCnc <10.0 <14.0 IU/mL  TSH     Status: None   Collection Time: 10/27/20 11:06 AM  Result Value Ref Range   TSH 1.050 0.450 - 4.500 uIU/mL  CMP14 + Anion Gap     Status: Abnormal   Collection Time: 10/27/20 11:06 AM  Result Value Ref Range   Glucose 106 (H) 65 - 99 mg/dL   BUN 13 6 - 20 mg/dL   Creatinine, Ser 1.06 0.76 - 1.27 mg/dL   GFR calc non Af Amer 89 >59 mL/min/1.73   GFR calc Af Amer 103 >59 mL/min/1.73    Comment: **In accordance with recommendations from the NKF-ASN Task force,**   [facility] is in the process of updating its eGFR calculation to the   2021 CKD-EPI creatinine equation that estimates kidney function   without a race variable.    BUN/Creatinine Ratio 12 9 - 20   Sodium 141 134 - 144 mmol/L   Potassium 4.4 3.5 - 5.2 mmol/L   Chloride 103 96 - 106 mmol/L   CO2 23 20 - 29 mmol/L   Anion Gap 15.0 10.0 - 18.0 mmol/L   Calcium 9.6  8.7 - 10.2 mg/dL   Total Protein 7.3 6.0 - 8.5 g/dL   Albumin 4.5 4.0 - 5.0 g/dL   Globulin, Total 2.8 1.5 - 4.5 g/dL   Albumin/Globulin Ratio 1.6 1.2 - 2.2   Bilirubin Total 0.3 0.0 - 1.2 mg/dL   Alkaline Phosphatase 41 (L) 44 - 121 IU/L    Comment:               **Please note reference interval change**   AST 16 0 - 40 IU/L   ALT 16 0 - 44 IU/L    Psychiatric Specialty Exam: Physical Exam  Review of Systems  Weight 141 lb (64 kg).There is no height or weight on file to calculate BMI.  General Appearance: NA  Eye Contact:  NA  Speech:  Slow  Volume:  Normal  Mood:  Euthymic  Affect:  NA  Thought Process:  Descriptions of Associations: Intact  Orientation:  Full (Time, Place, and Person)  Thought  Content:  WDL  Suicidal Thoughts:  No  Homicidal Thoughts:  No  Memory:  Immediate;   Good Recent;   Fair Remote;   Fair  Judgement:  Intact  Insight:  Present  Psychomotor Activity:  NA  Concentration:  Concentration: Fair and Attention Span: Fair  Recall:  [employer] of Knowledge:  Fair  Language:  Good  Akathisia:  No  Handed:  Right  AIMS (if indicated):     Assets:  Communication Skills Desire for Improvement Housing Transportation  ADL's:  Intact  Cognition:  WNL  Sleep:   ok      Assessment and Plan: Schizoaffective disorder, bipolar type.  Cannabis use.  I reviewed blood work results.  There were no Depakote level.  We will do Depakote ordered.  Patient does not want to change the medication since it is working well.  Continue Depakote 1000 mg at bedtime.  Reminded that he need to stop smoking as psychotropic medication interaction with substance use.  Patient acknowledged and he agreed that he is working on it.  He is not interested in therapy.  Discussed medication side effects and benefits.  Recommended to call us back if is any question or any concern.  Follow-up in 3 months.  Follow Up Instructions:    I discussed the assessment and treatment plan with the patient. The patient was provided an opportunity to ask questions and all were answered. The patient agreed with the plan and demonstrated an understanding of the instructions.   The patient was advised to call back or seek an in-person evaluation if the symptoms worsen or if the condition fails to improve as anticipated.  I provided 12 minutes of non-face-to-face time during this encounter.   Kathlee Nations, MD

## 2020-10-29 LAB — CMP14 + ANION GAP
ALT: 16 IU/L (ref 0–44)
AST: 16 IU/L (ref 0–40)
Albumin/Globulin Ratio: 1.6 (ref 1.2–2.2)
Albumin: 4.5 g/dL (ref 4.0–5.0)
Alkaline Phosphatase: 41 IU/L — ABNORMAL LOW (ref 44–121)
Anion Gap: 15 mmol/L (ref 10.0–18.0)
BUN/Creatinine Ratio: 12 (ref 9–20)
BUN: 13 mg/dL (ref 6–20)
Bilirubin Total: 0.3 mg/dL (ref 0.0–1.2)
CO2: 23 mmol/L (ref 20–29)
Calcium: 9.6 mg/dL (ref 8.7–10.2)
Chloride: 103 mmol/L (ref 96–106)
Creatinine, Ser: 1.06 mg/dL (ref 0.76–1.27)
GFR calc Af Amer: 103 mL/min/{1.73_m2} (ref >59)
GFR calc non Af Amer: 89 mL/min/{1.73_m2} (ref >59)
Globulin, Total: 2.8 g/dL (ref 1.5–4.5)
Glucose: 106 mg/dL — ABNORMAL HIGH (ref 65–99)
Potassium: 4.4 mmol/L (ref 3.5–5.2)
Sodium: 141 mmol/L (ref 134–144)
Total Protein: 7.3 g/dL (ref 6.0–8.5)

## 2020-10-29 LAB — TSH: TSH: 1.05 u[IU]/mL (ref 0.450–4.500)

## 2020-10-29 LAB — ANTINUCLEAR ANTIBODIES, IFA: ANA Titer 1: NEGATIVE

## 2020-10-29 LAB — RHEUMATOID FACTOR: Rheumatoid fact SerPl-aCnc: <10 IU/mL (ref <14.0)

## 2020-10-29 LAB — C-REACTIVE PROTEIN: CRP: <1 mg/L (ref 0–10)

## 2020-10-29 LAB — SEDIMENTATION RATE: Sed Rate: 9 mm/hr (ref 0–15)

## 2020-10-29 NOTE — Progress Notes (Signed)
[facility] Medicine Clinic Attending  Case discussed with Dr. Christian  At the time of the visit.  We reviewed the resident's history and exam and pertinent patient test results.  I agree with the assessment, diagnosis, and plan of care documented in the resident's note.  

## 2020-11-04 LAB — VALPROIC ACID LEVEL: Valproic Acid Lvl: <4 ug/mL — ABNORMAL LOW (ref 50–100)

## 2020-11-05 ENCOUNTER — Telehealth (HOSPITAL_COMMUNITY): Payer: Medicaid Other | Admitting: Psychiatry

## 2021-01-19 ENCOUNTER — Other Ambulatory Visit: Payer: Self-pay

## 2021-01-19 ENCOUNTER — Encounter (HOSPITAL_COMMUNITY): Payer: Self-pay

## 2021-01-19 ENCOUNTER — Ambulatory Visit (HOSPITAL_COMMUNITY)
Admission: EM | Admit: 2021-01-19 | Discharge: 2021-01-19 | Disposition: A | Discharge status: Home / Self Care | Payer: Medicaid Other | Attending: Medical Oncology | Admitting: Medical Oncology

## 2021-01-19 DIAGNOSIS — L03113 Cellulitis of right upper limb: Secondary | ICD-10-CM | POA: Diagnosis not present

## 2021-01-19 DIAGNOSIS — Z23 Encounter for immunization: Secondary | ICD-10-CM

## 2021-01-19 MED ORDER — DOXYCYCLINE HYCLATE 100 MG PO CAPS: 100.0000 mg | ORAL_CAPSULE | Freq: Two times a day (BID) | ORAL | 0 refills | Status: DC

## 2021-01-19 MED ORDER — TETANUS-DIPHTH-ACELL PERTUSSIS 5-2.5-18.5 LF-MCG/0.5 IM SUSY
PREFILLED_SYRINGE | INTRAMUSCULAR | Status: AC
  Filled 2021-01-19: qty 0.5

## 2021-01-19 MED ORDER — TETANUS-DIPHTH-ACELL PERTUSSIS 5-2.5-18.5 LF-MCG/0.5 IM SUSY
0.5000 mL | PREFILLED_SYRINGE | Freq: Once | INTRAMUSCULAR | Status: AC
  Administered 2021-01-19: 0.5 mL via INTRAMUSCULAR

## 2021-01-19 NOTE — ED Provider Notes (Signed)
[facility]    CSN: [id number] Arrival date & time: 01/19/21  3976      History   Chief Complaint Chief Complaint  Patient presents with  . Insect Bite    HPI Shawn Leach is a 40 y.o. male.   HPI   Insect Bite: Pt reports that he has had what appears to be a bug bite for the past day.  Yesterday he states he was working with a friend trying to take off a roof from a shed building where there are many insects around.  He suspects that he was bit as the area became slightly painful and itchy.  He reports that the bite in on the right arm and looks red with swelling and clear drainage.  He applied alcohol but the area has grown in size some.  No history of MRSA, no fevers, no bleeding from the area.  He does not think he has had a tetanus shot within the last 10 years.  Past Medical History:  Diagnosis Date  . Bipolar 1 disorder (HCC)   . Bradycardia    asymptomatic, normal EKG and BMET  . Gonococcal urethritis    hx in 5/03  . High risk homosexual behavior    Hx of relationship with man 2002-2005, no known disease in the partnet  . History of psychiatric disorder    unknown prior diagnosis  . Schizoaffective disorder, unspecified type (HCC) 03/16/2007   Diagnosed since childhood - teenage years Has been on medications including Depakote and Seroquel since then  Symptoms are stable but on disability due to this mental d/o  Late 2014: Fired from [employer] due to aggressive behavior 2015 Started attending [facility] in [place]     . Schizophrenia (HCC)   . Shoulder weakness 02/23/2018  . Tympanic membrane rupture    history of    Patient Active Problem List   Diagnosis Date Noted  . 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 Abdominal spasms 12/05/2019  . Back pain 06/05/2017  . Encounter for immunization 07/08/2016  . Adjustment disorder with emotional disturbance 06/28/2014  . Health care maintenance 01/03/2013  . Alcohol use 01/03/2013  . Schizoaffective disorder,  bipolar type (HCC) 03/16/2007    History reviewed. No pertinent surgical history.     Home Medications    Prior to Admission medications   Medication Sig Start Date End Date Taking? Authorizing Provider  cyclobenzaprine (FEXMID) 7.5 MG tablet Take 1 tablet (7.5 mg total) by mouth 3 (three) times daily as needed for muscle spasms. Patient not taking: No sig reported 06/07/19   Synetta Fail, MD  divalproex (DEPAKOTE ER) 500 MG 24 hr tablet Take 2 tablets (1,000 mg total) by mouth daily. 10/28/20   Arfeen, Phillips Grout, MD    Family History History reviewed. No pertinent family history.  Social History Social History   Tobacco Use  . Smoking status: Former Smoker    Types: Cigars    Quit date: 01/14/2011    Years since quitting: 10.0  . Smokeless tobacco: Never Used  . Tobacco comment: Black and Milds  Vaping Use  . Vaping Use: Never used  Substance Use Topics  . Alcohol use: Yes    Alcohol/week: 1.0 standard drink    Types: 1 Cans of beer per week    Comment: daily  . Drug use: Yes    Frequency: 2.0 times per week    Types: Marijuana     Allergies   Patient has no known allergies.  Review of Systems Review of Systems  As stated above in HPI Physical Exam Triage Vital Signs ED Triage Vitals  Enc Vitals Group     BP 01/19/21 0931 136/86     Pulse Rate 01/19/21 0931 (!) 54     Resp 01/19/21 0931 17     Temp 01/19/21 0931 98.1 F (36.7 C)     Temp Source 01/19/21 0931 Oral     SpO2 01/19/21 0931 96 %     Weight --      Height --      Head Circumference --      Peak Flow --      Pain Score 01/19/21 0930 3     Pain Loc --      Pain Edu? --      Excl. in GC? --    No data found.  Updated Vital Signs BP 136/86 (BP Location: Right Arm)   Pulse (!) 54   Temp 98.1 F (36.7 C) (Oral)   Resp 17   SpO2 96%   Physical Exam Vitals and nursing note reviewed.  Constitutional:      General: He is not in acute distress.    Appearance: Normal appearance.  He is not ill-appearing, toxic-appearing or diaphoretic.  Cardiovascular:     Rate and Rhythm: Normal rate and regular rhythm.     Heart sounds: Normal heart sounds.  Pulmonary:     Effort: Pulmonary effort is normal.     Breath sounds: Normal breath sounds.  Lymphadenopathy:     Cervical: No cervical adenopathy.  Skin:    Comments: There is a 2 inch round area of erythema without induration or fluctuance. There is mild blistering of the skin in this area and scant clear drainage from a cental location. No trailing erythema or other abnormality visualized.   Neurological:     Mental Status: He is alert.      UC Treatments / Results  Labs (all labs ordered are listed, but only abnormal results are displayed) Labs Reviewed - No data to display  EKG   Radiology No results found.  Procedures Procedures (including critical care time)  Medications Ordered in UC Medications - No data to display  Initial Impression / Assessment and Plan / UC Course  I have reviewed the triage vital signs and the nursing notes.  Pertinent labs & imaging results that were available during my care of the patient were reviewed by me and considered in my medical decision making (see chart for details).     New.  Treating with doxycycline and administering a Tdap today.  Discussed how to use along with common potential side effects of precautions of both.  Discussed red flag signs and symptoms as well.  He will follow up in 3 days to ensure that the area is improving. Final Clinical Impressions(s) / UC Diagnoses   Final diagnoses:  None   Discharge Instructions   None    ED Prescriptions    None     PDMP not reviewed this encounter.   Rushie Chestnut, [place] 01/19/21 [phone]

## 2021-01-19 NOTE — ED Triage Notes (Signed)
Pt presents with an insect bite in the right arm x 1 day.Sattes the are is red, swelling and clear drainage. Denies fever.

## 2021-01-23 ENCOUNTER — Telehealth (HOSPITAL_COMMUNITY): Payer: Medicaid Other | Admitting: Psychiatry

## 2021-01-26 ENCOUNTER — Telehealth (HOSPITAL_COMMUNITY): Payer: Medicaid Other | Admitting: Psychiatry

## 2021-01-26 ENCOUNTER — Other Ambulatory Visit: Payer: Self-pay

## 2021-01-27 ENCOUNTER — Other Ambulatory Visit: Payer: Self-pay

## 2021-01-27 ENCOUNTER — Encounter (HOSPITAL_COMMUNITY): Payer: Self-pay | Admitting: Psychiatry

## 2021-01-27 ENCOUNTER — Telehealth (INDEPENDENT_AMBULATORY_CARE_PROVIDER_SITE_OTHER): Payer: Medicaid Other | Admitting: Psychiatry

## 2021-01-27 VITALS — Wt 141.0 lb

## 2021-01-27 DIAGNOSIS — F1210 Cannabis abuse, uncomplicated: Secondary | ICD-10-CM

## 2021-01-27 DIAGNOSIS — F250 Schizoaffective disorder, bipolar type: Secondary | ICD-10-CM | POA: Diagnosis not present

## 2021-01-27 MED ORDER — DIVALPROEX SODIUM ER 500 MG PO TB24: 1000.0000 mg | ORAL_TABLET | Freq: Every day | ORAL | 0 refills | Status: DC

## 2021-01-27 NOTE — Progress Notes (Signed)
Virtual Visit via Telephone Note  I connected with Shawn Leach on 01/27/21 at  3:20 PM EDT by telephone and verified that I am speaking with the correct person using two identifiers.  Location: Patient: home Provider: home office   I discussed the limitations, risks, security and privacy concerns of performing an evaluation and management service by telephone and the availability of in person appointments. I also discussed with the patient that there may be a patient responsible charge related to this service. The patient expressed understanding and agreed to proceed.   History of Present Illness: Patient is evaluated by phone session.  He is taking Depakote 1000 mg at bedtime.  He feels the medicine is working and denies any hallucination, paranoia, irritability or any anger.  I reviewed his blood work.  His Depakote level is less than 4 but he insists that he takes the medicine every night and if he does not take the medicine he gets very irritable and having poor sleep.  He admitted sometimes drink alcohol but denies any intoxication, blackouts.  He continues to smoke marijuana frequently to calm him down.  Patient lives with his 4 kids who are 43-year-old, 11-year-old, 2 year old and 38 year old.  Patient told sometimes he get overwhelmed but denies any aggression, violence, highs and lows.  He feels the Depakote working and does not want to change the medication even though level is less than 4.  He has no tremors shakes or any EPS.  His appetite is okay.  His weight is unchanged from the past.  Past Psychiatric History:Reviewed H/Opsychiatric illness since youngage. H/Osuicidal thoughts, severe anger, mania and hallucination. Sawpsychiatrist at the local mental health agency which closed.H/O onebrief stay in the emergency room when intoxicated with alcohol.TookSeroquel(groggy and tired).No h/osuicidal attempt,nightmares flashback or OCD symptoms.  Recent Results (from the  past 2160 hour(s))  Valproic Acid level     Status: Abnormal   Collection Time: 11/03/20  4:25 PM  Result Value Ref Range   Valproic Acid Lvl <4 (L) 50 - 100 ug/mL    Comment: **Verified by repeat analysis**                                 Detection Limit = 4                            <4 indicates None Detected Toxicity may occur at levels of 100-500. Measurements of free unbound valproic acid may improve the assess- ment of clinical response.      Psychiatric Specialty Exam: Physical Exam  Review of Systems  Weight 141 lb (64 kg).There is no height or weight on file to calculate BMI.  General Appearance: NA  Eye Contact:  NA  Speech:  Normal Rate  Volume:  Normal  Mood:  Euthymic  Affect:  NA  Thought Process:  Goal Directed  Orientation:  Full (Time, Place, and Person)  Thought Content:  WDL  Suicidal Thoughts:  No  Homicidal Thoughts:  No  Memory:  Immediate;   Good Recent;   Fair Remote;   Fair  Judgement:  Intact  Insight:  Present  Psychomotor Activity:  NA  Concentration:  Concentration: Fair and Attention Span: Fair  Recall:  [employer] of Knowledge:  Good  Language:  Good  Akathisia:  No  Handed:  Right  AIMS (if indicated):     Assets:  Communication Skills Desire for Improvement Housing Resilience Social Support  ADL's:  Intact  Cognition:  WNL  Sleep:   ok      Assessment and Plan: Schizoaffective disorder, bipolar type.  Cannabis use.  I reviewed Depakote level.  It is less than 4 but patient insists that he is taking every night and not sure why level is not increased.  We talked about trying a different medication but so far patient feel his symptoms are under control and he does not have any hallucination, anger and mood swings.  He is not interested in therapy.  He requested to keep the Depakote since he has no issue.  I will continue Depakote 1000 mg at bedtime.  I encouraged not to smoke marijuana for alcohol due to interaction with  psychotropic medication.  Patient is trying to stop the drugs and substances.  Recommended to call us back if is any question or any concern.  Follow-up in 3 months.  Follow Up Instructions:    I discussed the assessment and treatment plan with the patient. The patient was provided an opportunity to ask questions and all were answered. The patient agreed with the plan and demonstrated an understanding of the instructions.   The patient was advised to call back or seek an in-person evaluation if the symptoms worsen or if the condition fails to improve as anticipated.  I provided 13 minutes of non-face-to-face time during this encounter.   Cleotis Nipper, MD

## 2021-02-16 ENCOUNTER — Encounter: Payer: Self-pay | Admitting: Internal Medicine

## 2021-02-19 ENCOUNTER — Other Ambulatory Visit: Payer: Self-pay

## 2021-02-19 ENCOUNTER — Ambulatory Visit (INDEPENDENT_AMBULATORY_CARE_PROVIDER_SITE_OTHER): Payer: Medicaid Other | Admitting: Internal Medicine

## 2021-02-19 ENCOUNTER — Encounter: Payer: Self-pay | Admitting: Internal Medicine

## 2021-02-19 VITALS — BP 118/74 | HR 75 | Temp 98.9°F | Ht 65.0 in | Wt 142.7 lb

## 2021-02-19 DIAGNOSIS — Z0000 Encounter for general adult medical examination without abnormal findings: Secondary | ICD-10-CM | POA: Diagnosis not present

## 2021-02-19 DIAGNOSIS — M545 Low back pain, unspecified: Secondary | ICD-10-CM | POA: Diagnosis not present

## 2021-02-19 DIAGNOSIS — F250 Schizoaffective disorder, bipolar type: Secondary | ICD-10-CM | POA: Diagnosis not present

## 2021-02-19 NOTE — Patient Instructions (Addendum)
Thank you for allowing [place] to provide your care today.  Today we did not make any changes to your medications. You are not due for any labwork today.   I have also provided some information on back pain and exercises.   Please follow-up in 1 year.    Should you have any questions or concerns please call the internal medicine clinic at [phone].      Back Exercises These exercises help to make your trunk and back strong. They also help to keep the lower back flexible. Doing these exercises can help to prevent back pain or lessen existing pain.  If you have back pain, try to do these exercises 2-3 times each day or as told by your doctor.  As you get better, do the exercises once each day. Repeat the exercises more often as told by your doctor.  To stop back pain from coming back, do the exercises once each day, or as told by your doctor. Exercises Single knee to chest Do these steps 3-5 times in a row for each leg: 1. Lie on your back on a firm bed or the floor with your legs stretched out. 2. Bring one knee to your chest. 3. Grab your knee or thigh with both hands and hold them it in place. 4. Pull on your knee until you feel a gentle stretch in your lower back or buttocks. 5. Keep doing the stretch for 10-30 seconds. 6. Slowly let go of your leg and straighten it. Pelvic tilt Do these steps 5-10 times in a row: 1. Lie on your back on a firm bed or the floor with your legs stretched out. 2. Bend your knees so they point up to the ceiling. Your feet should be flat on the floor. 3. Tighten your lower belly (abdomen) muscles to press your lower back against the floor. This will make your tailbone point up to the ceiling instead of pointing down to your feet or the floor. 4. Stay in this position for 5-10 seconds while you gently tighten your muscles and breathe evenly. Cat-cow Do these steps until your lower back bends more easily: 1. Get on your hands and knees on a firm  surface. Keep your hands under your shoulders, and keep your knees under your hips. You may put padding under your knees. 2. Let your head hang down toward your chest. Tighten (contract) the muscles in your belly. Point your tailbone toward the floor so your lower back becomes rounded like the back of a cat. 3. Stay in this position for 5 seconds. 4. Slowly lift your head. Let the muscles of your belly relax. Point your tailbone up toward the ceiling so your back forms a sagging arch like the back of a cow. 5. Stay in this position for 5 seconds.   Press-ups Do these steps 5-10 times in a row: 1. Lie on your belly (face-down) on the floor. 2. Place your hands near your head, about shoulder-width apart. 3. While you keep your back relaxed and keep your hips on the floor, slowly straighten your arms to raise the top half of your body and lift your shoulders. Do not use your back muscles. You may change where you place your hands in order to make yourself more comfortable. 4. Stay in this position for 5 seconds. 5. Slowly return to lying flat on the floor.   Bridges Do these steps 10 times in a row: 1. Lie on your back on a firm surface. 2.  Bend your knees so they point up to the ceiling. Your feet should be flat on the floor. Your arms should be flat at your sides, next to your body. 3. Tighten your butt muscles and lift your butt off the floor until your waist is almost as high as your knees. If you do not feel the muscles working in your butt and the back of your thighs, slide your feet 1-2 inches farther away from your butt. 4. Stay in this position for 3-5 seconds. 5. Slowly lower your butt to the floor, and let your butt muscles relax. If this exercise is too easy, try doing it with your arms crossed over your chest.   Belly crunches Do these steps 5-10 times in a row: 1. Lie on your back on a firm bed or the floor with your legs stretched out. 2. Bend your knees so they point up to the  ceiling. Your feet should be flat on the floor. 3. Cross your arms over your chest. 4. Tip your chin a little bit toward your chest but do not bend your neck. 5. Tighten your belly muscles and slowly raise your chest just enough to lift your shoulder blades a tiny bit off of the floor. Avoid raising your body higher than that, because it can put too much stress on your low back. 6. Slowly lower your chest and your head to the floor. Back lifts Do these steps 5-10 times in a row: 1. Lie on your belly (face-down) with your arms at your sides, and rest your forehead on the floor. 2. Tighten the muscles in your legs and your butt. 3. Slowly lift your chest off of the floor while you keep your hips on the floor. Keep the back of your head in line with the curve in your back. Look at the floor while you do this. 4. Stay in this position for 3-5 seconds. 5. Slowly lower your chest and your face to the floor. Contact a doctor if:  Your back pain gets a lot worse when you do an exercise.  Your back pain does not get better 2 hours after you exercise. If you have any of these problems, stop doing the exercises. Do not do them again unless your doctor says it is okay. Get help right away if:  You have sudden, very bad back pain. If this happens, stop doing the exercises. Do not do them again unless your doctor says it is okay. This information is not intended to replace advice given to you by your health care provider. Make sure you discuss any questions you have with your health care provider. Document Revised: 07/27/2018 Document Reviewed: 07/27/2018 Elsevier Patient Education  2021 Elsevier Inc.  Managing Chronic Back Pain Chronic back pain is back pain that lasts for 12 weeks or longer. It often affects the lower back. Back pain may feel like a muscle ache or a sharp, stabbing pain. It can be mild, moderate, or severe. If you have been diagnosed with chronic back pain, there are things you can  do to manage your symptoms. You may have to try different things to see what works best for you. Your health care provider may also give you specific instructions. How to manage lifestyle changes Treating chronic back pain often starts with rest and pain relief, followed by exercises to restore movement and strength to your back (physical therapy). You may need surgery if other treatments do not help, or if your pain is caused by  a condition or an injury. Follow your treatment plan as told by your health care provider. This may include:  Relaxation techniques.  Talk therapy or counseling with a mental health specialist. A form of talk therapy called cognitive behavioral therapy (CBT) can be especially helpful. This therapy helps you set goals and follow up on the changes that you make.  Acupuncture or massage therapy.  Local electrical stimulation.  Injections. These deliver numbing or pain-relieving medicines into your spine or the area of pain. How to recognize changes in your chronic back pain Your condition may improve with treatment. However, back pain may not go away or may get worse over time. Watch your symptoms carefully and let your health care provider know if your symptoms get worse or do not improve. Your back pain may be getting worse if you have:  Pain that begins to cause problems with posture.  Pain that gets worse when you are sitting, standing, walking, bending, or lifting.  Pain that affects you while you are active, or at rest, or both.  Pain that eventually makes it hard to move around (limits mobility).  Pain that occurs with fever, weight loss, or difficulty urinating.  Pain that causes numbness and tingling. How to use body mechanics and posture to help with pain Healthy body mechanics and good posture can help to relieve stress on your back. Body mechanics refers to the movements and positions of your body during your daily activities. Posture is part of body  mechanics. Good posture means:  Your spine is in its natural S-curve, or neutral, position.  Your shoulders are pulled back slightly.  Your head is not tipped forward. Follow these guidelines to improve your posture and body mechanics in your everyday activities. Standing  When standing, keep your spine neutral and your feet about hip-width apart. Keep your knees slightly bent. Your ears, shoulders, and hips should line up.  When you do a task in which you stand in one place for a long time, place one foot on a stable object that is 2-4 inches (5-10 cm) high, such as a footstool. This helps keep your spine neutral.   Sitting  When sitting, keep your spine neutral and your feet flat on the floor. Use a footrest, if necessary, and keep your thighs parallel to the floor. Avoid rounding your shoulders, and avoid tilting your head forward.  When working at a desk or a computer, keep your desk at a height where your hands are slightly lower than your elbows. Slide your chair under your desk so you are close enough to maintain good posture.  When working at a computer, place your monitor at a height where you are looking straight ahead and you do not have to tilt your head forward or downward to view the screen.   Lifting  Keep your feet at least shoulder-width apart and tighten the muscles of your abdomen.  Bend your knees and hips and keep your spine neutral. Be sure to lift using the strength of your legs, not your back. Do not lock your knees straight out.  Always ask for help to lift heavy or awkward objects.   Resting  When lying down and resting, avoid positions that are most painful.  If you have pain with activities such as sitting, bending, stooping, or squatting, lie in a position in which your body does not bend very much. For example, avoid curling up on your side with your arms and knees near your chest (fetal  position).  If you have pain with activities such as standing for a  long time or reaching with your arms, lie with your spine in a neutral position and bend your knees slightly. Try: ? Lying on your side with a pillow between your knees. ? Lying on your back with a pillow under your knees.   Follow these instructions at home: Medicines  Treatment may include over-the-counter or prescription medicines for pain and inflammation that are taken by mouth or applied to the skin. Another treatment may include muscle relaxants. Take over-the-counter and prescription medicines only as told by your health care provider.  Ask your health care provider if the medicine prescribed to you: ? Requires you to avoid driving or using machinery. ? Can cause constipation. You may need to take these actions to prevent or treat constipation:  Drink enough fluid to keep your urine pale yellow.  Take over-the-counter or prescription medicines.  Eat foods that are high in fiber, such as beans, whole grains, and fresh fruits and vegetables.  Limit foods that are high in fat and processed sugars, such as fried or sweet foods. Lifestyle  Do not use any products that contain nicotine or tobacco, such as cigarettes, e-cigarettes, and chewing tobacco. If you need help quitting, ask your health care provider.  Eat a healthy diet that includes foods such as vegetables, fruits, fish, and lean meats.  Work with your health care provider to achieve or maintain a healthy weight. General instructions  Get regular exercise as told. Exercise improves flexibility and strength.  If physical therapy was prescribed, do exercises as told by your health care provider.  Use ice or heat therapy as told by your health care provider.  Keep all follow-up visits as told by your health care provider. This is important. Where can I get support? Consider joining a support group for people managing chronic back pain. Ask your health care provider about support groups in your area. You can also find  online and in-person support groups through:  The American Chronic Pain Association: theacpa.org  Pain Connection Program: painconnection.org Contact a health care provider if:  You have pain that is not relieved with rest or medicine.  Your pain gets worse, or you have new pain.  You have a fever.  You have rapid weight loss.  You have trouble doing your normal activities. Get help right away if:  You have weakness or numbness in one or both of your legs or feet.  You have trouble controlling your bladder or your bowels.  You have severe back pain and have any of the following: ? Nausea or vomiting. ? Abdominal pain. ? Shortness of breath or you faint. Summary  Chronic back pain is often treated with rest, pain relief, and physical therapy.  Talk therapy, acupuncture, massage, and local electrical stimulation may help.  Follow your treatment plan as told by your health care provider.  Joining a support group may help you manage chronic back pain. This information is not intended to replace advice given to you by your health care provider. Make sure you discuss any questions you have with your health care provider. Document Revised: 12/13/2019 Document Reviewed: 08/21/2019 Elsevier Patient Education  2021 [employer]

## 2021-02-19 NOTE — Assessment & Plan Note (Signed)
Patient presents for routine follow-up.  He denies any complaints today.  He recently had blood work in December 2021.  At that time renal function and blood counts are within normal limits.  He is up-to-date with his screenings and vaccinations.  He has received 2 doses of the Covid vaccination.  Plan to follow-up annually unless he needs to be sooner.

## 2021-02-19 NOTE — Assessment & Plan Note (Signed)
Patient reports he has been stable on 1000 mg of Depakote daily.  He follows closely with a psychiatrist.

## 2021-02-19 NOTE — Progress Notes (Signed)
   CC: Annual wellness visit  HPI:  [name] is a 40 y.o. with a past medical history listed below presenting for an annual wellness visit. For details of today's visit and the status of his chronic medical issues please refer to the assessment and plan.   Past Medical History:  Diagnosis Date  . Bipolar 1 disorder (HCC)   . Bradycardia    asymptomatic, normal EKG and BMET  . Gonococcal urethritis    hx in 5/03  . High risk homosexual behavior    Hx of relationship with man 2002-2005, no known disease in the partnet  . History of psychiatric disorder    unknown prior diagnosis  . Schizoaffective disorder, unspecified type (HCC) 03/16/2007   Diagnosed since childhood - teenage years Has been on medications including Depakote and Seroquel since then  Symptoms are stable but on disability due to this mental d/o  Late 2014: Fired from [employer] due to aggressive behavior 2015 Started attending [facility] in [place]     . Schizophrenia (HCC)   . Shoulder weakness 02/23/2018  . Tympanic membrane rupture    history of   Review of Systems:   Review of Systems  Constitutional: Negative for chills, fever, malaise/fatigue and weight loss.  Eyes: Negative for blurred vision, double vision and photophobia.  Respiratory: Negative for cough, sputum production and shortness of breath.   Cardiovascular: Negative for chest pain and leg swelling.  Gastrointestinal: Negative for abdominal pain, constipation, diarrhea, nausea and vomiting.  Musculoskeletal: Positive for back pain. Negative for myalgias.  Neurological: Negative for dizziness, weakness and headaches.     Physical Exam:  Vitals:   02/19/21 1331  BP: 118/74  Pulse: 75  Temp: 98.9 F (37.2 C)  TempSrc: Oral  SpO2: 98%  Weight: 142 lb 11.2 oz (64.7 kg)  Height: 5\' 5"  (1.651 m)    Physical Exam Vitals reviewed.  Constitutional:      General: He is not in acute distress.    Appearance: Normal appearance. He is not  ill-appearing.  Cardiovascular:     Rate and Rhythm: Normal rate and regular rhythm.     Pulses: Normal pulses.     Heart sounds: Normal heart sounds. No murmur heard. No friction rub. No gallop.   Pulmonary:     Effort: Pulmonary effort is normal. No respiratory distress.     Breath sounds: Normal breath sounds. No wheezing or rales.  Abdominal:     General: Abdomen is flat. Bowel sounds are normal. There is no distension.     Palpations: Abdomen is soft.     Tenderness: There is no abdominal tenderness. There is no guarding.  Musculoskeletal:        General: No swelling or tenderness.     Right lower leg: No edema.     Left lower leg: No edema.  Skin:    General: Skin is warm and dry.  Neurological:     Mental Status: He is alert and oriented to person, place, and time.     Motor: No weakness.  Psychiatric:        Mood and Affect: Mood normal.        Behavior: Behavior normal.        Thought Content: Thought content normal.        Judgment: Judgment normal.     Assessment & Plan:   See Encounters Tab for problem based charting.  Patient discussed with Dr. 

## 2021-02-19 NOTE — Assessment & Plan Note (Signed)
Patient reports longstanding history of chronic lower back pain.  He states the pain is intermittent and dull/achy.  States that it does not interfere with his ADLs.  States he has done PT in the past.  Provided patient with back exercises and stretching.  Encouraged to continue physical activity.

## 2021-02-20 NOTE — Addendum Note (Signed)
Addended by: Erlinda Hong T on: 02/20/2021 10:20 AM   Modules accepted: Level of Service

## 2021-02-20 NOTE — Progress Notes (Signed)
Internal Medicine Clinic Attending ° °Case discussed with Dr. Rehman  At the time of the visit.  We reviewed the resident’s history and exam and pertinent patient test results.  I agree with the assessment, diagnosis, and plan of care documented in the resident’s note.  ° °

## 2021-04-29 ENCOUNTER — Telehealth (INDEPENDENT_AMBULATORY_CARE_PROVIDER_SITE_OTHER): Payer: Medicaid Other | Admitting: Psychiatry

## 2021-04-29 ENCOUNTER — Encounter (HOSPITAL_COMMUNITY): Payer: Self-pay | Admitting: Psychiatry

## 2021-04-29 ENCOUNTER — Other Ambulatory Visit: Payer: Self-pay

## 2021-04-29 ENCOUNTER — Encounter: Payer: Self-pay | Admitting: *Deleted

## 2021-04-29 VITALS — Wt 142.0 lb

## 2021-04-29 DIAGNOSIS — F250 Schizoaffective disorder, bipolar type: Secondary | ICD-10-CM | POA: Diagnosis not present

## 2021-04-29 DIAGNOSIS — F1210 Cannabis abuse, uncomplicated: Secondary | ICD-10-CM | POA: Diagnosis not present

## 2021-04-29 MED ORDER — DIVALPROEX SODIUM ER 500 MG PO TB24: 1000.0000 mg | ORAL_TABLET | Freq: Every day | ORAL | 0 refills | Status: DC

## 2021-04-29 NOTE — Progress Notes (Signed)
Virtual Visit via Telephone Note  I connected with Shawn Leach on 04/29/21 at  2:20 PM EDT by telephone and verified that I am speaking with the correct person using two identifiers.  Location: Patient: In Car Provider: Home Office   I discussed the limitations, risks, security and privacy concerns of performing an evaluation and management service by telephone and the availability of in person appointments. I also discussed with the patient that there may be a patient responsible charge related to this service. The patient expressed understanding and agreed to proceed.   History of Present Illness: Patient is evaluated by phone session.  He is taking Depakote 40 mg before he go to bed.  He admitted it helps his sleep but he also admitted smoking marijuana almost every day because it keep him calm.  He denies any irritability, mania, anger, paranoia or any hallucination.  He does talk to himself but do not reported any auditory or visual hallucination.  He admitted sometimes stressed out because he lives with his 4 kids who are teens.  Sometimes he gets overwhelmed but denies any aggression or violence.  He has no tremors, shakes or any EPS.  His weight is unchanged from the past.  Even though his Depakote level is less than 4 but he feels medicine helping and he does not want to change the medication or dosage.  He is in therapy every 2 weeks but do not remember the therapist's name.   Past Psychiatric History: Reviewed H/O psychiatric illness since young age.  H/O suicidal thoughts, severe anger, mania and hallucination. Saw psychiatrist at the local mental health agency which closed. H/O one brief stay in the emergency room when intoxicated with alcohol. Took Seroquel (groggy and tired).  No h/o suicidal attempt, nightmares flashback or OCD symptoms.   Psychiatric Specialty Exam: Physical Exam  Review of Systems  Weight 142 lb (64.4 kg).There is no height or weight on file to calculate  BMI.  General Appearance: NA  Eye Contact:  NA  Speech:  Slow  Volume:  Decreased  Mood:  Euthymic  Affect:  NA  Thought Process:  Descriptions of Associations: Intact  Orientation:  NA  Thought Content:  Rumination  Suicidal Thoughts:  No  Homicidal Thoughts:  No  Memory:  Immediate;   Fair Recent;   Fair Remote;   Fair  Judgement:  Fair  Insight:  Shallow  Psychomotor Activity:  NA  Concentration:  Concentration: Fair and Attention Span: Fair  Recall:  [employer] of Knowledge:  Fair  Language:  Fair  Akathisia:  No  Handed:  Right  AIMS (if indicated):     Assets:  Communication Skills Desire for Improvement Housing  ADL's:  Intact  Cognition:  WNL  Sleep:   ok      Assessment and Plan: Schizoaffective disorder, bipolar type.  Cannabis use.  Patient does not want to change the Depakote even though level is low.  He feels it is helping his mood and sleep.  He is also smoking marijuana and reluctant to cut down as he feels it keeps him calm.  However he started therapy and that so far are going well.  We will continue Depakote [name] milligram at bedtime.  Again discussed substance use, dependence and worsening of symptoms related to substance use.  Recommended to call us back if there is any question or any concern.  Follow-up in 3 months.  Follow Up Instructions:    I discussed the assessment and  treatment plan with the patient. The patient was provided an opportunity to ask questions and all were answered. The patient agreed with the plan and demonstrated an understanding of the instructions.   The patient was advised to call back or seek an in-person evaluation if the symptoms worsen or if the condition fails to improve as anticipated.  I provided 13 minutes of non-face-to-face time during this encounter.   Cleotis Nipper, MD

## 2021-05-19 ENCOUNTER — Encounter: Payer: Self-pay | Admitting: *Deleted

## 2021-07-30 ENCOUNTER — Encounter (HOSPITAL_COMMUNITY): Payer: Self-pay | Admitting: Psychiatry

## 2021-07-30 ENCOUNTER — Telehealth (INDEPENDENT_AMBULATORY_CARE_PROVIDER_SITE_OTHER): Payer: Medicaid Other | Admitting: Psychiatry

## 2021-07-30 ENCOUNTER — Other Ambulatory Visit: Payer: Self-pay

## 2021-07-30 VITALS — Wt 139.0 lb

## 2021-07-30 DIAGNOSIS — F250 Schizoaffective disorder, bipolar type: Secondary | ICD-10-CM | POA: Diagnosis not present

## 2021-07-30 DIAGNOSIS — F1210 Cannabis abuse, uncomplicated: Secondary | ICD-10-CM | POA: Diagnosis not present

## 2021-07-30 MED ORDER — DIVALPROEX SODIUM ER 500 MG PO TB24: 1000.0000 mg | ORAL_TABLET | Freq: Every day | ORAL | 0 refills | Status: DC

## 2021-07-30 NOTE — Progress Notes (Signed)
Virtual Visit via Telephone Note  I connected with Shawn Leach on 07/30/21 at 11:40 AM EDT by telephone and verified that I am speaking with the correct person using two identifiers.  Location: Patient: Home Provider: Office   I discussed the limitations, risks, security and privacy concerns of performing an evaluation and management service by telephone and the availability of in person appointments. I also discussed with the patient that there may be a patient responsible charge related to this service. The patient expressed understanding and agreed to proceed.   History of Present Illness: Patient is evaluated by phone session.  He is taking Depakote 1000 mg at bedtime which is helping his mood, irritability, anger, paranoia.  He does talk to himself sometimes there were no delusions or aggressive behavior.  His appetite is okay and his weight is unchanged from the past.  He is in therapy every 2 weeks with therapist Shyle at at [facility].  He still smokes marijuana which she believes calms him down and he tried to come off from it but he could not.  But overall he has less use of marijuana.  He has no tremors, shakes or any EPS.  He like to keep the current dose of Depakote.  He has not seen PCP in a while but like to schedule appointment to have blood work and physical.    Past Psychiatric History: Reviewed H/O psychiatric illness since young age.  H/O suicidal thoughts, severe anger, mania and hallucination. Saw psychiatrist at the local mental health agency which closed. H/O one brief stay in the emergency room when intoxicated with alcohol. Took Seroquel (groggy and tired).  No h/o suicidal attempt, nightmares flashback or OCD symptoms.    Psychiatric Specialty Exam: Physical Exam  Review of Systems  Weight 139 lb (63 kg).There is no height or weight on file to calculate BMI.  General Appearance: NA  Eye Contact:  NA  Speech:  Slow  Volume:  Decreased  Mood:  Euthymic   Affect:  NA  Thought Process:  Descriptions of Associations: Intact  Orientation:  Full (Time, Place, and Person)  Thought Content:  WDL  Suicidal Thoughts:  No  Homicidal Thoughts:  No  Memory:  Immediate;   Fair Recent;   Fair Remote;   Fair  Judgement:  Fair  Insight:  Present  Psychomotor Activity:  NA  Concentration:  Concentration: Fair and Attention Span: Fair  Recall:  [employer] of Knowledge:  Fair  Language:  Fair  Akathisia:  No  Handed:  Right  AIMS (if indicated):     Assets:  Communication Skills Desire for Improvement Housing  ADL's:  Intact  Cognition:  WNL  Sleep:   ok    Assessment and Plan: Schizoaffective disorder, bipolar type.  Cannabis use.  Patient is stable on his current medication Depakote 1000 g at bedtime.  We will send a message to his PCP to have his blood work drawn as he need also physical.  Discussed medication side effects and benefits.  Recommended to call us back if is any question or any concern.  Follow-up in 3 months.  Follow Up Instructions:    I discussed the assessment and treatment plan with the patient. The patient was provided an opportunity to ask questions and all were answered. The patient agreed with the plan and demonstrated an understanding of the instructions.   The patient was advised to call back or seek an in-person evaluation if the symptoms worsen or  if the condition fails to improve as anticipated.  I provided 17 minutes of non-face-to-face time during this encounter.   Cleotis Nipper, MD

## 2021-08-24 ENCOUNTER — Ambulatory Visit (INDEPENDENT_AMBULATORY_CARE_PROVIDER_SITE_OTHER): Payer: Medicaid Other | Admitting: Internal Medicine

## 2021-08-24 ENCOUNTER — Encounter: Payer: Self-pay | Admitting: Internal Medicine

## 2021-08-24 VITALS — BP 121/74 | HR 74 | Temp 98.2°F | Ht 65.0 in | Wt 136.1 lb

## 2021-08-24 DIAGNOSIS — M130 Polyarthritis, unspecified: Secondary | ICD-10-CM | POA: Diagnosis not present

## 2021-08-24 DIAGNOSIS — K591 Functional diarrhea: Secondary | ICD-10-CM

## 2021-08-24 DIAGNOSIS — F250 Schizoaffective disorder, bipolar type: Secondary | ICD-10-CM | POA: Diagnosis not present

## 2021-08-24 DIAGNOSIS — Z23 Encounter for immunization: Secondary | ICD-10-CM

## 2021-08-24 DIAGNOSIS — K529 Noninfective gastroenteritis and colitis, unspecified: Secondary | ICD-10-CM

## 2021-08-24 NOTE — Patient Instructions (Addendum)
Mr Shawn Leach,  It was a pleasure seeing you in clinic. Today we discussed:   Diarrhea/Decreased appetite: I am checking on some labs for this. I will call you with any abnormal results.   If you have any questions or concerns, please call our clinic at [phone] between 9am-5pm and after hours call [phone] and ask for the internal medicine resident on call. If you feel you are having a medical emergency please call 911.   Thank you, we look forward to helping you remain healthy!

## 2021-08-25 DIAGNOSIS — K529 Noninfective gastroenteritis and colitis, unspecified: Secondary | ICD-10-CM

## 2021-08-25 HISTORY — DX: Noninfective gastroenteritis and colitis, unspecified: K52.9

## 2021-08-25 NOTE — Assessment & Plan Note (Addendum)
Patient follows with psychiatrist afebrile hold for this.  He is on Depakote 1000 mg daily reports medication adherence.  Prior valproic acid levels have been subtherapeutic.  Repeat valproic acid level at this visit was 24.  Continues to be subtherapeutic.  Plan Patient benefit from increasing daily dosing to 1.5 to 2.5 g daily. Discussed with patient psychiatrist regarding increasing valproic acid to 1000 mg twice daily  ADDENDUM: Historically his level is low and not existent. I will take this level ok as compared to no or low level. He does not take his meds on time and if we increase his dose it can be toxic. If behaviorally he is doing well than no change I his meds. Focus is to take meds every day and on time. I believe level will get better on its own. We can repeat the level in 3 months. Arfeen

## 2021-08-25 NOTE — Assessment & Plan Note (Addendum)
Patient reports several years of decreased appetite for which he uses marijuana and diarrhea. He notes that he initially attributed this to lactose intolerance and has cut out dairy products from his diet. However, has also noted symptoms present with salad dressing and ramen noodles. He feels like his food "runs right through". He notes multiple episodes of diarrhea throughout the day. He notes that these are mostly brown but did have one episode several days ago that was dark green. Denies any abdominal pain or bloating although per chart review has had "abdominal spasms".  Patient does also have history of polyarthropathies with inflammatory markers being wnl. CMP obtained - no abnormal findings.   Plan: Anti-TTG ab to evaluate for celiac disease H pylori testing Continue to monitor If persistent, may consider referal to GI for colonoscopy    ADDENDUM: H pylori and anti-TTG testing negative. Patient continues to have diarrhea. Discussed results with patient and he would like to proceed with referral to GI.

## 2021-08-25 NOTE — Progress Notes (Signed)
   CC: Valproic acid level, diarrhea  HPI:  [name] is a 40 y.o. male with past medical history as stated below presenting for evaluation of valproic acid level.  He also endorses several years of decreased appetite for which he uses marijuana and chronic diarrhea.  No abdominal pain or changes in weight.  Please see problem based charting for complete assessment and plan  Past Medical History:  Diagnosis Date   Abdominal spasms 12/05/2019   Bipolar 1 disorder (HCC)    Bradycardia    asymptomatic, normal EKG and BMET   Gonococcal urethritis    hx in 5/03   High risk homosexual behavior    Hx of relationship with man 2002-2005, no known disease in the partnet   History of psychiatric disorder    unknown prior diagnosis   Schizoaffective disorder, unspecified type (HCC) 03/16/2007   Diagnosed since childhood - teenage years Has been on medications including Depakote and Seroquel since then  Symptoms are stable but on disability due to this mental d/o  Late 2014: Fired from [employer] due to aggressive behavior 2015 Started attending [facility] in [place]      Schizophrenia [facility])    Shoulder weakness 02/23/2018   Tympanic membrane rupture    history of   Review of Systems: Negative except as stated in HPI  Physical Exam:  Vitals:   08/24/21 1502  BP: 121/74  Pulse: 74  Temp: 98.2 F (36.8 C)  TempSrc: Oral  SpO2: 98%  Weight: 136 lb 1.6 oz (61.7 kg)  Height: 5\' 5"  (1.651 m)   Physical Exam  Constitutional: Appears well-developed and well-nourished. No distress.  HENT: Normocephalic and atraumatic, EOMI, conjunctiva normal, moist mucous membranes Cardiovascular: Normal rate, regular rhythm, S1 and S2 present, no murmurs, rubs, gallops.  Distal pulses intact Respiratory: Lungs are clear to auscultation bilaterally. GI: Nondistended, soft, nontender to palpation, normal bowel sounds Musculoskeletal: Normal bulk and tone.   Neurological: Is alert and oriented x4, no  apparent focal deficits noted. Skin: Warm and dry.  No rash, erythema, lesions noted. Psychiatric: Normal mood and affect.    Assessment & Plan:   See Encounters Tab for problem based charting.  Patient discussed with Dr.  

## 2021-08-26 LAB — CMP14 + ANION GAP
ALT: 17 IU/L (ref 0–44)
AST: 30 IU/L (ref 0–40)
Albumin/Globulin Ratio: 1.6 (ref 1.2–2.2)
Albumin: 4.6 g/dL (ref 4.0–5.0)
Alkaline Phosphatase: 45 IU/L (ref 44–121)
Anion Gap: 17 mmol/L (ref 10.0–18.0)
BUN/Creatinine Ratio: 10 (ref 9–20)
BUN: 10 mg/dL (ref 6–20)
Bilirubin Total: 0.4 mg/dL (ref 0.0–1.2)
CO2: 23 mmol/L (ref 20–29)
Calcium: 9.3 mg/dL (ref 8.7–10.2)
Chloride: 100 mmol/L (ref 96–106)
Creatinine, Ser: 1.02 mg/dL (ref 0.76–1.27)
Globulin, Total: 2.8 g/dL (ref 1.5–4.5)
Glucose: 74 mg/dL (ref 70–99)
Potassium: 4.4 mmol/L (ref 3.5–5.2)
Sodium: 140 mmol/L (ref 134–144)
Total Protein: 7.4 g/dL (ref 6.0–8.5)
eGFR: 96 mL/min/{1.73_m2} (ref >59)

## 2021-08-26 LAB — CBC
Hematocrit: 41.3 % (ref 37.5–51.0)
Hemoglobin: 14.1 g/dL (ref 13.0–17.7)
MCH: 30.1 pg (ref 26.6–33.0)
MCHC: 34.1 g/dL (ref 31.5–35.7)
MCV: 88 fL (ref 79–97)
Platelets: 241 10*3/uL (ref 150–450)
RBC: 4.68 x10E6/uL (ref 4.14–5.80)
RDW: 13.3 % (ref 11.6–15.4)
WBC: 5.1 10*3/uL (ref 3.4–10.8)

## 2021-08-26 LAB — TISSUE TRANSGLUTAMINASE ABS,IGG,IGA
Tissue Transglut Ab: <2 U/mL (ref 0–5)
Transglutaminase IgA: <2 U/mL (ref 0–3)

## 2021-08-26 LAB — VALPROIC ACID LEVEL: Valproic Acid Lvl: 23 ug/mL — ABNORMAL LOW (ref 50–100)

## 2021-08-26 NOTE — Progress Notes (Signed)
Internal Medicine Clinic Attending  Case discussed with Dr. Mcarthur Rossetti  At the time of the visit.  We reviewed the resident's history and exam and pertinent patient test results.  I agree with the assessment, diagnosis, and plan of care documented in the resident's note. Given GI symptoms and history of polyarthralgias, will rule out celiac disease. Continue to consider fibromyalgia as a possible diagnosis given previous negative evaluation for autoimmune cause. GI symptoms could also be due to IBS picture. Weight stable.

## 2021-08-28 ENCOUNTER — Other Ambulatory Visit: Payer: Medicaid Other

## 2021-08-31 LAB — H. PYLORI ANTIGEN, STOOL: H pylori Ag, Stl: NEGATIVE

## 2021-09-10 NOTE — Addendum Note (Signed)
Addended by: Eliezer Bottom on: 09/10/2021 04:40 PM   Modules accepted: Orders

## 2021-10-21 ENCOUNTER — Telehealth (HOSPITAL_BASED_OUTPATIENT_CLINIC_OR_DEPARTMENT_OTHER): Payer: Medicaid Other | Admitting: Psychiatry

## 2021-10-21 ENCOUNTER — Encounter (HOSPITAL_COMMUNITY): Payer: Self-pay | Admitting: Psychiatry

## 2021-10-21 ENCOUNTER — Other Ambulatory Visit: Payer: Self-pay

## 2021-10-21 VITALS — Wt 136.0 lb

## 2021-10-21 DIAGNOSIS — F1210 Cannabis abuse, uncomplicated: Secondary | ICD-10-CM | POA: Diagnosis not present

## 2021-10-21 DIAGNOSIS — F250 Schizoaffective disorder, bipolar type: Secondary | ICD-10-CM

## 2021-10-21 MED ORDER — DIVALPROEX SODIUM ER 500 MG PO TB24: 1000.0000 mg | ORAL_TABLET | Freq: Every day | ORAL | 0 refills | Status: DC

## 2021-10-21 NOTE — Progress Notes (Signed)
Virtual Visit via Telephone Note  I connected with Shawn Leach on 10/21/21 at  1:00 PM EST by telephone and verified that I am speaking with the correct person using two identifiers.  Location: Patient: Home Provider: Home Office   I discussed the limitations, risks, security and privacy concerns of performing an evaluation and management service by telephone and the availability of in person appointments. I also discussed with the patient that there may be a patient responsible charge related to this service. The patient expressed understanding and agreed to proceed.   History of Present Illness: Patient is evaluated by phone session.  He had a physical and blood work.  His Depakote level is 23 and all other labs are normal and stable.  He continues to smoke marijuana which he claimed helps calm him down.  When questioned about the compliance of his Depakote he replied most of the time he takes the medication.  He sleeps good.  He is in therapy with his counselor Chantel at [facility].  Denies any mania, psychosis or any hallucination.  He reported Depakote when he takes help his anger and mood swings.  He has no tremors, shakes or any EPS.  He does not want to change or increase the dose of the medication.  Past Psychiatric History: Reviewed H/O psychiatric illness since young age.  H/O suicidal thoughts, severe anger, mania and hallucination. Saw psychiatrist at the local mental health agency which closed. H/O one brief stay in the emergency room when intoxicated with alcohol. Took Seroquel (groggy and tired).  No h/o suicidal attempt, nightmares flashback or OCD symptoms.  Recent Results (from the past 2160 hour(s))  CMP14 + Anion Gap     Status: None   Collection Time: 08/24/21  4:01 PM  Result Value Ref Range   Glucose 74 70 - 99 mg/dL    Comment:               **Please note reference interval change**   BUN 10 6 - 20 mg/dL   Creatinine, Ser 1.02 0.76 - 1.27 mg/dL   eGFR 96  >59 mL/min/1.73   BUN/Creatinine Ratio 10 9 - 20   Sodium 140 134 - 144 mmol/L   Potassium 4.4 3.5 - 5.2 mmol/L   Chloride 100 96 - 106 mmol/L   CO2 23 20 - 29 mmol/L   Anion Gap 17.0 10.0 - 18.0 mmol/L   Calcium 9.3 8.7 - 10.2 mg/dL   Total Protein 7.4 6.0 - 8.5 g/dL   Albumin 4.6 4.0 - 5.0 g/dL   Globulin, Total 2.8 1.5 - 4.5 g/dL   Albumin/Globulin Ratio 1.6 1.2 - 2.2   Bilirubin Total 0.4 0.0 - 1.2 mg/dL   Alkaline Phosphatase 45 44 - 121 IU/L   AST 30 0 - 40 IU/L   ALT 17 0 - 44 IU/L  CBC no Diff     Status: None   Collection Time: 08/24/21  4:01 PM  Result Value Ref Range   WBC 5.1 3.4 - 10.8 x10E3/uL   RBC 4.68 4.14 - 5.80 x10E6/uL   Hemoglobin 14.1 13.0 - 17.7 g/dL   Hematocrit 41.3 37.5 - 51.0 %   MCV 88 79 - 97 fL   MCH 30.1 26.6 - 33.0 pg   MCHC 34.1 31.5 - 35.7 g/dL   RDW 13.3 11.6 - 15.4 %   Platelets 241 150 - 450 x10E3/uL  Valproic Acid     Status: Abnormal   Collection Time: 08/24/21  4:01 PM  Result Value Ref Range   Valproic Acid Lvl 23 (L) 50 - 100 ug/mL    Comment:                                 Detection Limit = 4                            <4 indicates None Detected Toxicity may occur at levels of 100-500. Measurements of free unbound valproic acid may improve the assess- ment of clinical response.   Tissue Transglutaminase Abs,IgG,IgA     Status: None   Collection Time: 08/24/21  4:01 PM  Result Value Ref Range   Transglutaminase IgA <2 0 - 3 U/mL    Comment:                               Negative        0 -  3                               Weak Positive   4 - 10                               Positive           >10  Tissue Transglutaminase (tTG) has been identified  as the endomysial antigen.  Studies have demonstr-  ated that endomysial IgA antibodies have over 99%  specificity for gluten sensitive enteropathy.    Tissue Transglut Ab <2 0 - 5 U/mL    Comment:                               Negative        0 - 5                                Weak Positive   6 - 9                               Positive           >9   H. pylori antigen, stool     Status: None   Collection Time: 08/27/21 10:30 PM  Result Value Ref Range   H pylori Ag, Stl Negative Negative    Psychiatric Specialty Exam: Physical Exam  Review of Systems  Weight 136 lb (61.7 kg).There is no height or weight on file to calculate BMI.  General Appearance: NA  Eye Contact:  NA  Speech:  Slow  Volume:  Decreased  Mood:  Euthymic  Affect:  NA  Thought Process:  Descriptions of Associations: Intact  Orientation:  Full (Time, Place, and Person)  Thought Content:  WDL  Suicidal Thoughts:  No  Homicidal Thoughts:  No  Memory:  Immediate;   Fair Recent;   Fair Remote;   Fair  Judgement:  Fair  Insight:  Present  Psychomotor Activity:  NA  Concentration:  Concentration: Fair and Attention Span: Fair  Recall:  [employer] of Knowledge:  Fair  Language:  Fair  Akathisia:  No  Handed:  Right  AIMS (if indicated):     Assets:  Communication Skills Desire for Improvement Housing Social Support  ADL's:  Intact  Cognition:  WNL  Sleep:   ok      Assessment and Plan: Schizoaffective disorder, bipolar type.  Cannabis use.  Discussed blood work results and Depakote level.  His level is subtherapeutic and he admitted there are days when he does not remember taking the medication.  Reinforced the compliance with the medication to avoid decompensation.  We discussed stopping cannabis but patient does not want to stop as he claims it helps to calm him down.  Continue Depakote 1000 g at bedtime.  Recommended to have some reminder to take the medicine on time.  Follow-up in 3 months.  Follow Up Instructions:    I discussed the assessment and treatment plan with the patient. The patient was provided an opportunity to ask questions and all were answered. The patient agreed with the plan and demonstrated an understanding of the instructions.   The patient was  advised to call back or seek an in-person evaluation if the symptoms worsen or if the condition fails to improve as anticipated.  I provided 18 minutes of non-face-to-face time during this encounter.   Kathlee Nations, MD

## 2021-10-28 ENCOUNTER — Encounter: Payer: Medicaid Other | Admitting: Internal Medicine

## 2021-10-28 ENCOUNTER — Other Ambulatory Visit: Payer: Self-pay

## 2021-10-28 ENCOUNTER — Ambulatory Visit (HOSPITAL_COMMUNITY)
Admission: EM | Admit: 2021-10-28 | Discharge: 2021-10-28 | Disposition: A | Discharge status: Home / Self Care | Payer: Medicaid Other

## 2021-10-28 ENCOUNTER — Telehealth (HOSPITAL_COMMUNITY): Payer: Medicaid Other | Admitting: Psychiatry

## 2021-10-28 ENCOUNTER — Telehealth: Payer: Self-pay | Admitting: *Deleted

## 2021-10-28 ENCOUNTER — Encounter (HOSPITAL_COMMUNITY): Payer: Self-pay | Admitting: Emergency Medicine

## 2021-10-28 DIAGNOSIS — R0789 Other chest pain: Secondary | ICD-10-CM | POA: Diagnosis not present
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
## 2021-10-29 ENCOUNTER — Ambulatory Visit: Payer: Medicaid Other | Admitting: Internal Medicine

## 2021-10-29 VITALS — BP 132/93 | HR 68 | Wt 134.9 lb

## 2021-10-29 DIAGNOSIS — R404 Transient alteration of awareness: Secondary | ICD-10-CM | POA: Insufficient documentation

## 2021-10-29 NOTE — Patient Instructions (Signed)
We have made a neurology referral, someone will call you within 1 week  Please call our office if you don't hear back within 1 week  PLEASE do not drive until the neurologist clears you.

## 2021-10-29 NOTE — Progress Notes (Signed)
CC: acute visit for altered consciousness three times this year  HPI:  [name] is a 40 y.o. male with a PMHx stated below and presents today for stated above. Please see the Encounters tab for problem-based Assessment & Plan for additional details.   Past Medical History:  Diagnosis Date   Abdominal spasms 12/05/2019   Bipolar 1 disorder (HCC)    Bradycardia    asymptomatic, normal EKG and BMET   Gonococcal urethritis    hx in 5/03   High risk homosexual behavior    Hx of relationship with man 2002-2005, no known disease in the partnet   History of psychiatric disorder    unknown prior diagnosis   Schizoaffective disorder, unspecified type (HCC) 03/16/2007   Diagnosed since childhood - teenage years Has been on medications including Depakote and Seroquel since then  Symptoms are stable but on disability due to this mental d/o  Late 2014: Fired from [place] [facility] due to aggressive behavior 2015 Started attending [facility] in [place]      Schizophrenia [facility]    Shoulder weakness 02/23/2018   Tympanic membrane rupture    history of    Current Outpatient Medications on File Prior to Visit  Medication Sig Dispense Refill   divalproex (DEPAKOTE ER) 500 MG 24 hr tablet Take 2 tablets (1,000 mg total) by mouth daily. 180 tablet 0   No current facility-administered medications on file prior to visit.    No family history on file.  Social History   Socioeconomic History   Marital status: Single    Spouse name: Not on file   Number of children: Not on file   Years of education: Not on file   Highest education level: Not on file  Occupational History   Not on file  Tobacco Use   Smoking status: Former    Types: Cigars    Quit date: 01/14/2011    Years since quitting: 10.7   Smokeless tobacco: Never   Tobacco comments:    Black and Milds  Vaping Use   Vaping Use: Never used  Substance and Sexual Activity   Alcohol use: Yes    Alcohol/week: 1.0 standard drink     Types: 1 Cans of beer per week    Comment: daily   Drug use: Yes    Frequency: 2.0 times per week    Types: Marijuana   Sexual activity: Yes    Partners: Female    Comment: disabled on SSI, single, has a girlfriend (now only heterosexual) and 2 daughters,   Other Topics Concern   Not on file  Social History Narrative   Not on file   Social Determinants of Health   Financial Resource Strain: Not on file  Food Insecurity: Not on file  Transportation Needs: Not on file  Physical Activity: Not on file  Stress: Not on file  Social Connections: Not on file  Intimate Partner Violence: Not on file    Review of Systems: ROS negative except for what is noted on the assessment and plan.  Vitals:   10/29/21 0926  BP: (!) 132/93  Pulse: 68  SpO2: 100%  Weight: 134 lb 14.4 oz (61.2 kg)     Physical Exam: Constitutional: alert, well-appearing, in NAD HENT: normocephalic, atraumatic, mucous membranes moist Eyes: conjunctiva non-erythematous, EOMI Neck: supple, no lymphadenopathy  Cardiovascular: RRR, no m/r/g, non-edematous bilateral LE Pulmonary/Chest: normal work of breathing on RA, LCTAB MSK: normal bulk and tone  Neurological: A&O x 3, 5/5 strength in  bilateral upper and lower extremities. CN grossly intact without deficits.  Psych: normal behavior, normal affect    Assessment & Plan:   See Encounters Tab for problem based charting.  Patient seen with Dr. Marcelline Mates, MD  Internal Medicine Resident, PGY-1 [facility]
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
## 2021-11-02 ENCOUNTER — Encounter: Payer: Self-pay | Admitting: Neurology
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
## 2021-11-11 ENCOUNTER — Ambulatory Visit: Payer: Medicaid Other | Admitting: Neurology

## 2021-11-11 ENCOUNTER — Other Ambulatory Visit: Payer: Self-pay

## 2021-11-11 ENCOUNTER — Encounter: Payer: Self-pay | Admitting: Neurology

## 2021-11-11 VITALS — BP 123/81 | HR 70 | Ht 65.0 in | Wt 134.2 lb

## 2021-11-11 DIAGNOSIS — R404 Transient alteration of awareness: Secondary | ICD-10-CM | POA: Diagnosis not present

## 2021-11-11 NOTE — Patient Instructions (Signed)
Schedule MRI brain with and without contrast  2. Schedule 1-hour EEG. If normal, we will do a 24-hour EEG  3. Continue taking Depakote regularly, it can also help with seizures, as well as mood  4. As per [place] driving laws, no driving after an episode of loss of awareness, until 6 months event-free  5. Follow-up after tests, call for any changes
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
## 2021-12-08 ENCOUNTER — Ambulatory Visit: Payer: Medicaid Other | Admitting: Neurology

## 2021-12-08 ENCOUNTER — Other Ambulatory Visit: Payer: Self-pay

## 2021-12-08 DIAGNOSIS — R404 Transient alteration of awareness: Secondary | ICD-10-CM

## 2021-12-09 ENCOUNTER — Ambulatory Visit
Admission: RE | Admit: 2021-12-09 | Discharge: 2021-12-09 | Disposition: A | Discharge status: Home / Self Care | Payer: Medicaid Other | Source: Ambulatory Visit | Attending: Neurology | Admitting: Neurology

## 2021-12-09 DIAGNOSIS — R404 Transient alteration of awareness: Secondary | ICD-10-CM

## 2021-12-09 MED ORDER — GADOBENATE DIMEGLUMINE 529 MG/ML IV SOLN
12.0000 mL | Freq: Once | INTRAVENOUS | Status: AC | PRN
  Administered 2021-12-09: 12 mL via INTRAVENOUS

## 2021-12-09 NOTE — Procedures (Signed)
ELECTROENCEPHALOGRAM REPORT  Date of Study: 12/08/2021  Patient's Name: Shawn Leach MRN: [id number] Date of Birth: 01/07/1981  Referring Provider: Dr. Patrcia Dolly  Clinical History: This is a 41 year old man with episodes of altered consciousness, staring/unresponsive episodes. EEG for classification.  Medications: Depakote  Technical Summary: A multichannel digital 1-hour EEG recording measured by the international 10-20 system with electrodes applied with paste and impedances below 5000 ohms performed in our laboratory with EKG monitoring in an awake and asleep patient.  Hyperventilation was not performed. Photic stimulation was performed.  The digital EEG was referentially recorded, reformatted, and digitally filtered in a variety of bipolar and referential montages for optimal display.    Description: The patient is awake and asleep during the recording.  During maximal wakefulness, there is a symmetric, medium voltage 10.5 Hz posterior dominant rhythm that attenuates with eye opening.  The record is symmetric.  During drowsiness and sleep, there is an increase in theta slowing of the background.  Vertex waves and symmetric sleep spindles were seen.  Photic stimulation did not elicit any abnormalities.  There were no epileptiform discharges or electrographic seizures seen.    EKG lead was unremarkable.  Impression: This 1-hour awake and asleep EEG is normal.    Clinical Correlation: A normal EEG does not exclude a clinical diagnosis of epilepsy.  If further clinical questions remain, prolonged EEG may be helpful.  Clinical correlation is advised.   Patrcia Dolly, M.D.

## 2021-12-21 ENCOUNTER — Telehealth: Payer: Self-pay

## 2021-12-21 NOTE — Telephone Encounter (Signed)
-----   Message from Van Clines, MD sent at 12/17/2021 12:48 PM EST ----- Pls let him know brain MRI looked fine, no tumor, stroke, or bleed. Proceed with prolonged EEG as scheduled. Thanks

## 2021-12-21 NOTE — Telephone Encounter (Signed)
Pt called an informed that brain MRI looked fine, no tumor, stroke, or bleed. Proceed with prolonged EEG as scheduled

## 2022-01-05 ENCOUNTER — Other Ambulatory Visit: Payer: Self-pay

## 2022-01-05 ENCOUNTER — Ambulatory Visit (INDEPENDENT_AMBULATORY_CARE_PROVIDER_SITE_OTHER): Payer: Medicaid Other | Admitting: Neurology

## 2022-01-05 DIAGNOSIS — R404 Transient alteration of awareness: Secondary | ICD-10-CM

## 2022-01-07 ENCOUNTER — Other Ambulatory Visit (INDEPENDENT_AMBULATORY_CARE_PROVIDER_SITE_OTHER): Payer: Medicaid Other

## 2022-01-07 ENCOUNTER — Encounter: Payer: Self-pay | Admitting: Neurology

## 2022-01-07 ENCOUNTER — Ambulatory Visit (INDEPENDENT_AMBULATORY_CARE_PROVIDER_SITE_OTHER): Payer: Medicaid Other | Admitting: Neurology

## 2022-01-07 ENCOUNTER — Other Ambulatory Visit: Payer: Self-pay

## 2022-01-07 VITALS — BP 130/76 | HR 60 | Ht 65.0 in | Wt 139.0 lb

## 2022-01-07 DIAGNOSIS — F250 Schizoaffective disorder, bipolar type: Secondary | ICD-10-CM

## 2022-01-07 DIAGNOSIS — R404 Transient alteration of awareness: Secondary | ICD-10-CM | POA: Diagnosis not present
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
## 2022-01-07 NOTE — Patient Instructions (Signed)
Good to see you.  Check Depakote level. If level is low, we will plan to increase you Depakote to provide additional coverage  2. Take medication regularly  3. Follow-up in 2-3 months, call for any changes

## 2022-01-08 ENCOUNTER — Telehealth: Payer: Self-pay

## 2022-01-08 DIAGNOSIS — F250 Schizoaffective disorder, bipolar type: Secondary | ICD-10-CM

## 2022-01-08 LAB — VALPROIC ACID LEVEL: Valproic Acid Lvl: 18.8 mg/L — ABNORMAL LOW (ref 50.0–100.0)

## 2022-01-08 MED ORDER — DIVALPROEX SODIUM ER 500 MG PO TB24: 1000.0000 mg | ORAL_TABLET | Freq: Every day | ORAL | 3 refills | Status: DC

## 2022-01-08 MED ORDER — DIVALPROEX SODIUM ER 500 MG PO TB24: ORAL_TABLET | ORAL | 3 refills | Status: DC
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
## 2022-01-08 NOTE — Telephone Encounter (Signed)
Pt called an informed Depakote level is low. Increase Depakote to 3 tablets daily, I sent in updated Rx. Pls remind him he needs to take the medication regularly, use alarm and pillbox to help

## 2022-01-08 NOTE — Telephone Encounter (Signed)
-----   Message from Cameron Sprang, MD sent at 01/08/2022  1:21 PM EST ----- Pls let him know the Depakote level is low. Increase Depakote to 3 tablets daily, I sent in updated Rx. Pls remind him he needs to take the medication regularly, use alarm and pillbox to help. Thanks

## 2022-01-26 ENCOUNTER — Telehealth (HOSPITAL_BASED_OUTPATIENT_CLINIC_OR_DEPARTMENT_OTHER): Payer: Medicaid Other | Admitting: Psychiatry

## 2022-01-26 ENCOUNTER — Encounter (HOSPITAL_COMMUNITY): Payer: Self-pay | Admitting: Psychiatry

## 2022-01-26 ENCOUNTER — Other Ambulatory Visit: Payer: Self-pay

## 2022-01-26 VITALS — Wt 139.0 lb

## 2022-01-26 DIAGNOSIS — F1210 Cannabis abuse, uncomplicated: Secondary | ICD-10-CM | POA: Diagnosis not present

## 2022-01-26 DIAGNOSIS — F250 Schizoaffective disorder, bipolar type: Secondary | ICD-10-CM
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
## 2022-03-09 ENCOUNTER — Telehealth (INDEPENDENT_AMBULATORY_CARE_PROVIDER_SITE_OTHER): Payer: Medicaid Other | Admitting: Neurology

## 2022-03-09 ENCOUNTER — Telehealth: Payer: Self-pay

## 2022-03-09 ENCOUNTER — Encounter: Payer: Self-pay | Admitting: Neurology

## 2022-03-09 ENCOUNTER — Other Ambulatory Visit: Payer: Self-pay

## 2022-03-09 VITALS — Ht 65.0 in | Wt 134.0 lb

## 2022-03-09 DIAGNOSIS — R404 Transient alteration of awareness: Secondary | ICD-10-CM

## 2022-03-09 DIAGNOSIS — F250 Schizoaffective disorder, bipolar type: Secondary | ICD-10-CM

## 2022-03-09 NOTE — Patient Instructions (Signed)
Continue Depakote 500mg : take 3 tablets every morning ? ?2. Recheck Depakote level ? ?3. Have a companion when you initially resume driving ? ?4. Follow-up in 6 months, call for any changes. ? ? ?

## 2022-03-09 NOTE — Telephone Encounter (Signed)
Depakote level has been ordered. ? ?Called patient and informed him that Dr. Karel Jarvis would like him to do bloodwork first thing in morning before he takes his morning dose, or towards end of the day before lab closes. Informed patient to come to our office to check in for labs and provided patient with lab hours. Patient verbalized understanding and had no further questions or concerns. ?
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
## 2022-03-10 ENCOUNTER — Other Ambulatory Visit (INDEPENDENT_AMBULATORY_CARE_PROVIDER_SITE_OTHER): Payer: Medicaid Other

## 2022-03-10 DIAGNOSIS — F250 Schizoaffective disorder, bipolar type: Secondary | ICD-10-CM

## 2022-03-10 DIAGNOSIS — R404 Transient alteration of awareness: Secondary | ICD-10-CM | POA: Diagnosis not present

## 2022-03-11 LAB — VALPROIC ACID LEVEL: Valproic Acid Lvl: 41.3 mg/L — ABNORMAL LOW (ref 50.0–100.0)

## 2022-03-25 ENCOUNTER — Encounter (HOSPITAL_COMMUNITY): Payer: Self-pay | Admitting: Emergency Medicine

## 2022-03-25 ENCOUNTER — Ambulatory Visit (HOSPITAL_COMMUNITY)
Admission: EM | Admit: 2022-03-25 | Discharge: 2022-03-25 | Disposition: A | Discharge status: Home / Self Care | Payer: Medicaid Other | Attending: Nurse Practitioner | Admitting: Nurse Practitioner

## 2022-03-25 DIAGNOSIS — R0789 Other chest pain: Secondary | ICD-10-CM

## 2022-03-25 DIAGNOSIS — M546 Pain in thoracic spine: Secondary | ICD-10-CM | POA: Diagnosis not present

## 2022-03-25 DIAGNOSIS — M25512 Pain in left shoulder: Secondary | ICD-10-CM

## 2022-03-25 MED ORDER — NAPROXEN 500 MG PO TABS
500.0000 mg | ORAL_TABLET | Freq: Two times a day (BID) | ORAL | 0 refills | Status: DC
Start: 2022-03-25 — End: 2022-09-22

## 2022-03-25 MED ORDER — TIZANIDINE HCL 4 MG PO TABS: 4.0000 mg | ORAL_TABLET | Freq: Three times a day (TID) | ORAL | 0 refills | Status: DC | PRN
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
## 2022-03-29 ENCOUNTER — Ambulatory Visit (INDEPENDENT_AMBULATORY_CARE_PROVIDER_SITE_OTHER): Payer: Medicaid Other | Admitting: Internal Medicine

## 2022-03-29 ENCOUNTER — Encounter: Payer: Self-pay | Admitting: Internal Medicine

## 2022-03-29 VITALS — BP 107/78 | HR 60 | Temp 97.7°F | Ht 65.0 in | Wt 135.8 lb

## 2022-03-29 DIAGNOSIS — R404 Transient alteration of awareness: Secondary | ICD-10-CM

## 2022-03-29 DIAGNOSIS — Z789 Other specified health status: Secondary | ICD-10-CM

## 2022-03-29 DIAGNOSIS — M545 Low back pain, unspecified: Secondary | ICD-10-CM

## 2022-03-29 NOTE — Patient Instructions (Signed)
Shawn Leach ? ?It was a pleasure seeing you in the clinic today.  ? ?We talked about your back pain, your vision, and alcohol. ? ?Back pain- pick up muscle relaxer and the NSAID at your [facility] pharmacy. However if you are unable to get them through medicaid you can also buy the naproxen over the counter ? ?Referrals- they will contact you to schedule an eye exam and physical therapy ? ?Alcohol- I am proud of you for stopping drinking. We are here if you need any support ? ?Please call our clinic at [phone] if you have any questions or concerns. The best time to call is Monday-Friday from 9am-4pm, but there is someone available 24/7 at the same number. If you need medication refills, please notify your pharmacy one week in advance and they will send [place] a request. ?  ?Thank you for letting us take part in your care. We look forward to seeing you next time! ? ?
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
## 2022-03-29 NOTE — Progress Notes (Signed)
? ?  CC: back pain ? ?HPI: ? ?[name] is a 41 y.o. PMH noted below, who presents to the [facility] with complaints of back pain. To see the management of his acute and chronic conditions, please refer to the A&P note under the encounters tab.  ? ?Past Medical History:  ?Diagnosis Date  ? Abdominal spasms 12/05/2019  ? Bipolar 1 disorder (HCC)   ? Bradycardia   ? asymptomatic, normal EKG and BMET  ? Gonococcal urethritis   ? hx in 5/03  ? High risk homosexual behavior   ? Hx of relationship with man 2002-2005, no known disease in the partnet  ? History of psychiatric disorder   ? unknown prior diagnosis  ? Schizoaffective disorder, unspecified type (HCC) 03/16/2007  ? Diagnosed since childhood - teenage years Has been on medications including Depakote and Seroquel since then  Symptoms are stable but on disability due to this mental d/o  Late 2014: Fired from [employer] due to aggressive behavior 2015 Started attending [facility] in [place]     ? Schizophrenia (HCC)   ? Shoulder weakness 02/23/2018  ? Tympanic membrane rupture   ? history of  ? ?Review of Systems:  positive for back pain, chest pain, leg pain, negative for paresthesia, muscle weakness, falls, SHOB ? ?Physical Exam: ?Gen: middle aged man in NAD ?HEENT: normocephalic atraumatic, MMM, neck supple ?CV: RRR, no m/r/g   ?Resp: CTAB, normal WOB  ?GI: soft, nontender ?MSK: full strength in extremities, no tenderness to palpation over the paraspinal muscles ?Skin:warm and dry ?Neuro:alert answering questions appropriately ?Psych: normal affect ? ? ?Assessment & Plan:  ? ?See Encounters Tab for problem based charting. ? ?Patient discussed with Dr. Lafonda Mosses  ? ?

## 2022-03-29 NOTE — Progress Notes (Signed)
Internal Medicine Clinic Attending ° °Case discussed with Dr. DeMaio  At the time of the visit.  We reviewed the resident’s history and exam and pertinent patient test results.  I agree with the assessment, diagnosis, and plan of care documented in the resident’s note. ° ° °

## 2022-03-29 NOTE — Assessment & Plan Note (Signed)
Patient reports quitting drinking as of New Years. He denies any difficulty with cravings and says his stomach feels better since quitting.  ?

## 2022-03-29 NOTE — Assessment & Plan Note (Signed)
Patient is being worked up by neurology for possible seizure. He has not had any further episodes. However patient mentioned he wanted to get his eyes checked since he has never had that done. ?- optometry referral ?

## 2022-04-19 ENCOUNTER — Ambulatory Visit: Payer: Medicaid Other | Attending: Neurology

## 2022-04-19 DIAGNOSIS — S39012A Strain of muscle, fascia and tendon of lower back, initial encounter: Secondary | ICD-10-CM | POA: Diagnosis present

## 2022-04-19 DIAGNOSIS — M545 Low back pain, unspecified: Secondary | ICD-10-CM | POA: Diagnosis not present

## 2022-04-19 DIAGNOSIS — M6281 Muscle weakness (generalized): Secondary | ICD-10-CM | POA: Diagnosis present
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
## 2022-04-21 ENCOUNTER — Ambulatory Visit: Payer: Medicaid Other

## 2022-04-21 DIAGNOSIS — M6281 Muscle weakness (generalized): Secondary | ICD-10-CM

## 2022-04-21 DIAGNOSIS — S39012A Strain of muscle, fascia and tendon of lower back, initial encounter: Secondary | ICD-10-CM

## 2022-04-26 ENCOUNTER — Ambulatory Visit: Payer: Medicaid Other

## 2022-04-26 DIAGNOSIS — M6281 Muscle weakness (generalized): Secondary | ICD-10-CM

## 2022-04-26 DIAGNOSIS — S39012A Strain of muscle, fascia and tendon of lower back, initial encounter: Secondary | ICD-10-CM | POA: Diagnosis not present
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
## 2022-04-28 ENCOUNTER — Encounter (HOSPITAL_COMMUNITY): Payer: Self-pay | Admitting: Psychiatry

## 2022-04-28 ENCOUNTER — Telehealth (HOSPITAL_BASED_OUTPATIENT_CLINIC_OR_DEPARTMENT_OTHER): Payer: Medicaid Other | Admitting: Psychiatry

## 2022-04-28 VITALS — Wt 135.0 lb

## 2022-04-28 DIAGNOSIS — F1210 Cannabis abuse, uncomplicated: Secondary | ICD-10-CM | POA: Diagnosis not present

## 2022-04-28 DIAGNOSIS — F250 Schizoaffective disorder, bipolar type: Secondary | ICD-10-CM

## 2022-04-28 MED ORDER — DIVALPROEX SODIUM ER 500 MG PO TB24: ORAL_TABLET | ORAL | 3 refills | Status: DC
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
## 2022-04-29 ENCOUNTER — Ambulatory Visit: Payer: Medicaid Other

## 2022-05-03 ENCOUNTER — Ambulatory Visit: Payer: Medicaid Other

## 2022-05-03 DIAGNOSIS — S39012A Strain of muscle, fascia and tendon of lower back, initial encounter: Secondary | ICD-10-CM

## 2022-05-03 DIAGNOSIS — M6281 Muscle weakness (generalized): Secondary | ICD-10-CM
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
## 2022-05-05 ENCOUNTER — Ambulatory Visit: Payer: Medicaid Other

## 2022-05-05 DIAGNOSIS — M6281 Muscle weakness (generalized): Secondary | ICD-10-CM

## 2022-05-05 DIAGNOSIS — S39012A Strain of muscle, fascia and tendon of lower back, initial encounter: Secondary | ICD-10-CM
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
## 2022-05-10 ENCOUNTER — Ambulatory Visit: Payer: Medicaid Other

## 2022-05-10 DIAGNOSIS — S39012A Strain of muscle, fascia and tendon of lower back, initial encounter: Secondary | ICD-10-CM | POA: Diagnosis not present

## 2022-05-10 DIAGNOSIS — M6281 Muscle weakness (generalized): Secondary | ICD-10-CM

## 2022-05-14 ENCOUNTER — Ambulatory Visit: Payer: Medicaid Other
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
## 2022-05-19 ENCOUNTER — Ambulatory Visit: Payer: Medicaid Other | Attending: Neurology

## 2022-05-19 DIAGNOSIS — S39012A Strain of muscle, fascia and tendon of lower back, initial encounter: Secondary | ICD-10-CM | POA: Insufficient documentation

## 2022-05-19 DIAGNOSIS — M6281 Muscle weakness (generalized): Secondary | ICD-10-CM | POA: Diagnosis present

## 2022-05-21 ENCOUNTER — Ambulatory Visit: Payer: Medicaid Other

## 2022-05-21 DIAGNOSIS — M6281 Muscle weakness (generalized): Secondary | ICD-10-CM

## 2022-05-21 DIAGNOSIS — S39012A Strain of muscle, fascia and tendon of lower back, initial encounter: Secondary | ICD-10-CM
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
## 2022-08-17 ENCOUNTER — Other Ambulatory Visit: Payer: Self-pay

## 2022-08-17 DIAGNOSIS — F250 Schizoaffective disorder, bipolar type: Secondary | ICD-10-CM

## 2022-08-17 MED ORDER — DIVALPROEX SODIUM ER 500 MG PO TB24: ORAL_TABLET | ORAL | 3 refills | Status: DC

## 2022-08-26 ENCOUNTER — Ambulatory Visit: Payer: Medicaid Other | Admitting: Internal Medicine

## 2022-08-26 ENCOUNTER — Encounter: Payer: Self-pay | Admitting: Internal Medicine

## 2022-08-26 ENCOUNTER — Other Ambulatory Visit: Payer: Self-pay

## 2022-08-26 ENCOUNTER — Telehealth (HOSPITAL_BASED_OUTPATIENT_CLINIC_OR_DEPARTMENT_OTHER): Payer: Medicaid Other | Admitting: Psychiatry

## 2022-08-26 ENCOUNTER — Encounter (HOSPITAL_COMMUNITY): Payer: Self-pay | Admitting: Psychiatry

## 2022-08-26 VITALS — BP 133/70 | HR 58 | Temp 98.5°F | Ht 65.0 in | Wt 136.4 lb

## 2022-08-26 VITALS — Wt 136.0 lb

## 2022-08-26 DIAGNOSIS — F250 Schizoaffective disorder, bipolar type: Secondary | ICD-10-CM

## 2022-08-26 DIAGNOSIS — Z23 Encounter for immunization: Secondary | ICD-10-CM

## 2022-08-26 DIAGNOSIS — Z0000 Encounter for general adult medical examination without abnormal findings: Secondary | ICD-10-CM

## 2022-08-26 DIAGNOSIS — R404 Transient alteration of awareness: Secondary | ICD-10-CM | POA: Diagnosis not present

## 2022-08-26 DIAGNOSIS — F1210 Cannabis abuse, uncomplicated: Secondary | ICD-10-CM
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
## 2022-08-27 ENCOUNTER — Encounter: Payer: Self-pay | Admitting: Internal Medicine

## 2022-08-27 LAB — CMP14 + ANION GAP
ALT: 16 IU/L (ref 0–44)
AST: 20 IU/L (ref 0–40)
Albumin/Globulin Ratio: 1.7 (ref 1.2–2.2)
Albumin: 4.7 g/dL (ref 4.1–5.1)
Alkaline Phosphatase: 45 IU/L (ref 44–121)
Anion Gap: 15 mmol/L (ref 10.0–18.0)
BUN/Creatinine Ratio: 9 (ref 9–20)
BUN: 8 mg/dL (ref 6–20)
Bilirubin Total: 0.2 mg/dL (ref 0.0–1.2)
CO2: 25 mmol/L (ref 20–29)
Calcium: 9.8 mg/dL (ref 8.7–10.2)
Chloride: 100 mmol/L (ref 96–106)
Creatinine, Ser: 0.9 mg/dL (ref 0.76–1.27)
Globulin, Total: 2.8 g/dL (ref 1.5–4.5)
Glucose: 89 mg/dL (ref 70–99)
Potassium: 4.3 mmol/L (ref 3.5–5.2)
Sodium: 140 mmol/L (ref 134–144)
Total Protein: 7.5 g/dL (ref 6.0–8.5)
eGFR: 111 mL/min/{1.73_m2} (ref >59)

## 2022-08-27 LAB — CBC
Hematocrit: 45.8 % (ref 37.5–51.0)
Hemoglobin: 14.6 g/dL (ref 13.0–17.7)
MCH: 28.5 pg (ref 26.6–33.0)
MCHC: 31.9 g/dL (ref 31.5–35.7)
MCV: 89 fL (ref 79–97)
Platelets: 222 10*3/uL (ref 150–450)
RBC: 5.13 x10E6/uL (ref 4.14–5.80)
RDW: 13.4 % (ref 11.6–15.4)
WBC: 4.6 10*3/uL (ref 3.4–10.8)

## 2022-08-27 LAB — HEMOGLOBIN A1C
Est. average glucose Bld gHb Est-mCnc: 128 mg/dL
Hgb A1c MFr Bld: 6.1 % — ABNORMAL HIGH (ref 4.8–5.6)

## 2022-08-27 NOTE — Progress Notes (Signed)
CBC and CMP wnl. A1c in prediabetic range. Recommend diet and lifestyle modification. No indication for medical therapy at this point.

## 2022-08-30 ENCOUNTER — Encounter: Payer: Self-pay | Admitting: Internal Medicine
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
## 2022-09-13 ENCOUNTER — Telehealth: Payer: Self-pay | Admitting: Internal Medicine

## 2022-09-13 NOTE — Telephone Encounter (Signed)
Call to patient: No answer. Will try again later.  Estimated body mass index is 22.63 kg/m as calculated from the following:   Height as of 08/26/22: 5\' 5"  (1.651 m).   Weight as of 08/26/22: 136 lb (61.7 kg). Lab Results  Component Value Date   HGBA1C 6.1 (H) 08/26/2022   HGBA1C 5.9 (H) 09/26/2018   HGBA1C 5.8 07/28/2009

## 2022-09-13 NOTE — Telephone Encounter (Signed)
Pt requesting a call back.  Pt states he needs help understanding his diabetes results.

## 2022-09-13 NOTE — Telephone Encounter (Signed)
Return pt's call. He has questions about his AIC and what it's means and what he needs to do. Informed A1c is 6.1 which is pre-diabetic.  Per Dr Elliot Gurney "A1c in prediabetic range. Recommend diet and lifestyle modification. No indication for medical therapy at this point.".  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

## 2022-09-14 NOTE — Telephone Encounter (Signed)
Thanks Donna!

## 2022-09-14 NOTE — Telephone Encounter (Signed)
Spoke with Mr. Check this morning and answered his question about what he can do to lower his A1C and what an A1C is. Encouraged well balanced meals with plenty of vegetables, fruits, whole grains, nuts and seeds and to drink mostly water. Also encouraged activity after meals to total at least 30-60  minutes per day. Mailed patient information about prediabetes and how to prevent diabetes per his request.

## 2022-09-22 ENCOUNTER — Ambulatory Visit: Payer: Medicaid Other | Admitting: Neurology

## 2022-09-22 ENCOUNTER — Other Ambulatory Visit (INDEPENDENT_AMBULATORY_CARE_PROVIDER_SITE_OTHER): Payer: Medicaid Other

## 2022-09-22 ENCOUNTER — Encounter: Payer: Self-pay | Admitting: Neurology

## 2022-09-22 VITALS — BP 103/62 | HR 58 | Ht 65.0 in | Wt 142.4 lb

## 2022-09-22 DIAGNOSIS — R404 Transient alteration of awareness: Secondary | ICD-10-CM | POA: Diagnosis not present
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
## 2022-09-23 LAB — VALPROIC ACID LEVEL: Valproic Acid Lvl: 141.8 mg/L — ABNORMAL HIGH (ref 50.0–100.0)

## 2022-09-29 ENCOUNTER — Telehealth: Payer: Self-pay

## 2022-09-29 NOTE — Telephone Encounter (Signed)
-----   Message from Van Clines, MD sent at 09/24/2022  8:50 AM EST ----- Pls let him know that his level is not low, it is a little higher than usual because I think he had taken his medication earlier that morning. Would continue on the same dose of 3 tablets every morning. Thanks

## 2022-09-29 NOTE — Telephone Encounter (Signed)
Pt called an informed that he valproic acid level is not low, it is a little higher than usual because Dr Dewaine Conger thinks he had taken his medication earlier that morning. Would continue on the same dose of 3 tablets every morning. Pt verbalized understanding,

## 2022-12-04 IMAGING — CR DG PELVIS 1-2V
1 series · 1 of 1 positions shown · non-contrast
Comparison: None.

CLINICAL DATA: 39-year-old male with history of low back and right
hip pain. Bilateral shoulder and wrist pain. Query SI joints.

EXAM:
PELVIS - 1-2 VIEW

[pelvis ap]
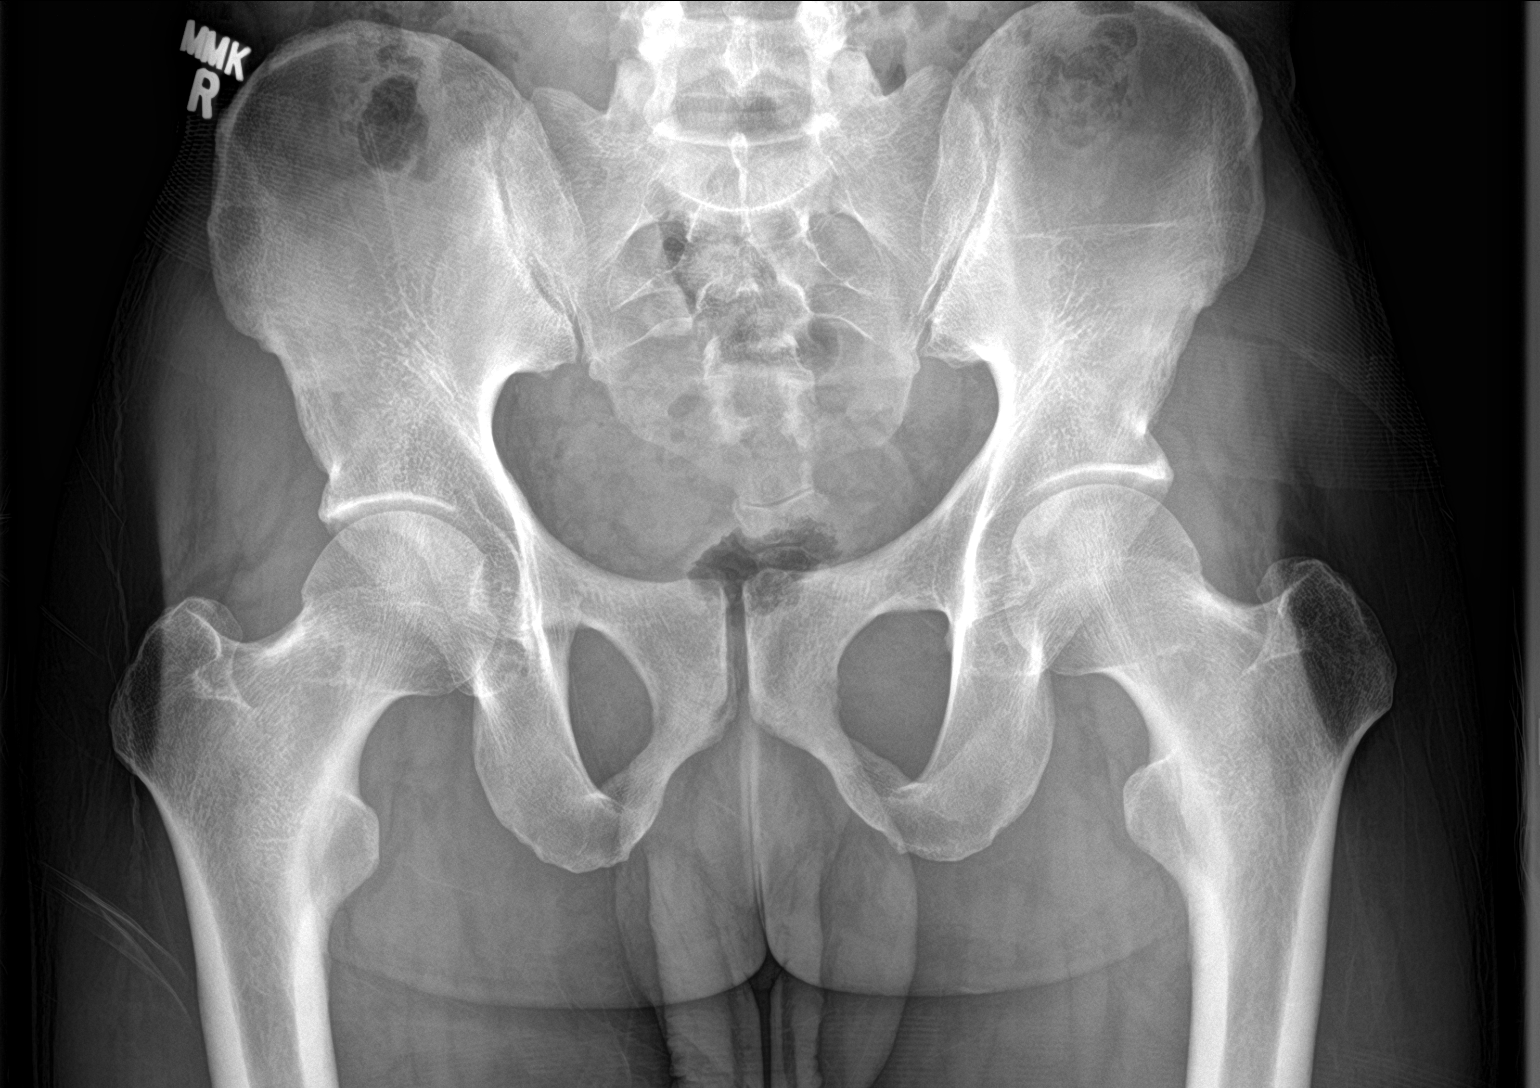

[1 of 1 positions shown; findings below may reference images not displayed]

FINDINGS: Normal bone mineralization about the pelvis. Both SI joints appear
normal. Pelvis and sacrum appear intact. Hip joint spaces appear
symmetric and within normal limits. Negative lower abdominal and
pelvic visceral contours.
IMPRESSION: Negative radiographic appearance of the pelvis, SI joints appear
normal.

## 2023-02-02 ENCOUNTER — Encounter: Payer: Self-pay | Admitting: Internal Medicine

## 2023-02-02 ENCOUNTER — Ambulatory Visit: Payer: Medicaid Other | Admitting: Internal Medicine

## 2023-02-02 VITALS — BP 116/68 | HR 61 | Temp 97.7°F | Ht 65.0 in | Wt 146.5 lb

## 2023-02-02 DIAGNOSIS — F250 Schizoaffective disorder, bipolar type: Secondary | ICD-10-CM

## 2023-02-02 DIAGNOSIS — Z0000 Encounter for general adult medical examination without abnormal findings: Secondary | ICD-10-CM

## 2023-02-02 DIAGNOSIS — R7303 Prediabetes: Secondary | ICD-10-CM

## 2023-02-02 NOTE — Progress Notes (Signed)
CC: routine visit  HPI:  [name] is a 42 y.o. male living with a history stated below and presents today for routine office visit. Please see problem based assessment and plan for additional details.  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 He lives with his 4 kids (ages 62, 52, 85, 75). He notes that he exercises/stretches every morning. His diet is fairly healty and regular, noting that he eats soups, spaghetti, and sandwiches (lunch/dinner) and his breakfast usually consists of eggs and bacon.  Past Medical History:  Diagnosis Date   Abdominal spasms 12/05/2019   Bipolar 1 disorder (HCC)    Bradycardia    asymptomatic, normal EKG and BMET   Chronic diarrhea of unknown origin 08/25/2021   Gonococcal urethritis    hx in 5/03   High risk homosexual behavior    Hx of relationship with man 2002-2005, no known disease in the partnet   History of psychiatric disorder    unknown prior diagnosis   Schizoaffective disorder, unspecified type ([place]) 03/16/2007   Diagnosed since childhood - teenage years Has been on medications including Depakote and Seroquel since then  Symptoms are stable but on disability due to this mental d/o  Late 2014: Fired from [employer] due to aggressive behavior 2015 Started attending [facility] in [place]      Schizophrenia [facility])    Shoulder weakness 02/23/2018   Tympanic membrane rupture    history of    Current Outpatient Medications on File Prior to Visit  Medication Sig Dispense Refill   divalproex (DEPAKOTE ER) 500 MG 24 hr tablet Take 3 tablets by mouth daily 270 tablet 3   No current facility-administered medications on file prior to visit.    No family history on file.  Social History   Socioeconomic History   Marital status: Single    Spouse name: Not on file   Number of children: Not on file   Years of education: Not on file    Highest education level: Not on file  Occupational History   Not on file  Tobacco Use   Smoking status: Former    Types: Cigars    Quit date: 01/14/2011    Years since quitting: 12.0   Smokeless tobacco: Never  Vaping Use   Vaping Use: Never used  Substance and Sexual Activity   Alcohol use: Not Currently   Drug use: Yes    Frequency: 2.0 times per week    Types: Marijuana   Sexual activity: Yes    Partners: Female    Comment: disabled on SSI, single, has a girlfriend (now only heterosexual) and 2 daughters,   Other Topics Concern   Not on file  Social History Narrative      Are you right handed or left handed? Right handed    Are you currently employed ? 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 no   Who lives with you? Lives with aunts    What type of home do you live in: 1 story or 2 story? Has steps        Social Determinants of Health   Financial Resource Strain: Not on file  Food Insecurity: Not on file  Transportation Needs: Not on file  Physical Activity: Not on file  Stress: Not on file  Social Connections: Not on file  Intimate Partner Violence: Not  on file    Review of Systems: ROS negative except for what is noted on the assessment and plan.  Vitals:   02/02/23 0851  BP: 116/68  Pulse: 61  Temp: 97.7 F (36.5 C)  TempSrc: Oral  SpO2: 96%  Weight: 146 lb 8 oz (66.5 kg)  Height: 5\' 5"  (1.651 m)    Physical Exam: Constitutional: well-appearing male sitting in chair, in no acute distress Cardiovascular: regular rate and rhythm, no m/r/g Pulmonary/Chest: normal work of breathing on room air, lungs clear to auscultation bilaterally MSK: normal bulk and tone Neurological: alert & oriented x 3, no focal deficit Skin: warm and dry Psych: normal mood and behavior  Assessment & Plan:    Patient discussed with Dr. Daryll Drown  Schizoaffective disorder, bipolar type [facility]) The patient was diagnosed with schizoaffective  disorder (bipolar type) in his teenage years.  He has been on Depakote 1500 mg daily and has been doing well with this dose.  Patient also sees a counselor every 2 weeks, and feels like this significantly helps him.   PHQ-9 score 0 today.  Prediabetes Patient has a family history of diabetes, but his A1c has never been > 6.4%.  His last A1c in October 2023 was 6.1%.  Will recheck this in about 7 months, around October 2024.   Health care maintenance Patient is up-to-date with his screenings and vaccinations.  Could have a shared decision-making conversation about prostate cancer screening at next office visit.   Buddy Duty, D.O. [facility], PGY-2 Phone: [phone] Date 02/02/2023 Time 9:24 AM

## 2023-02-02 NOTE — Patient Instructions (Addendum)
Thank you, Shawn Leach for allowing [place] to provide your care today. Today we discussed:  Keep taking depakote 3 pills per day - please let us know when you need a refill and I will send it in!  You have prediabetes and we will recheck this number at your next office visit around October  You do not need a colonoscopy until you are 27  I have ordered the following labs for you:  Lab Orders  No laboratory test(s) ordered today      Referrals ordered today:   Referral Orders  No referral(s) requested today     I have ordered the following medication/changed the following medications:   Stop the following medications: There are no discontinued medications.   Start the following medications: No orders of the defined types were placed in this encounter.    Follow up:  ~7 months     Should you have any questions or concerns please call the internal medicine clinic at [phone].     Buddy Duty, D.O. [place]

## 2023-02-02 NOTE — Assessment & Plan Note (Signed)
The patient was diagnosed with schizoaffective disorder (bipolar type) in his teenage years.  He has been on Depakote 1500 mg daily and has been doing well with this dose.  Patient also sees a counselor every 2 weeks, and feels like this significantly helps him.   PHQ-9 score 0 today.

## 2023-02-02 NOTE — Assessment & Plan Note (Signed)
Patient has a family history of diabetes, but his A1c has never been > 6.4%.  His last A1c in October 2023 was 6.1%.  Will recheck this in about 7 months, around October 2024.

## 2023-02-02 NOTE — Assessment & Plan Note (Signed)
Patient is up-to-date with his screenings and vaccinations.  Could have a shared decision-making conversation about prostate cancer screening at next office visit.

## 2023-02-03 ENCOUNTER — Ambulatory Visit (HOSPITAL_COMMUNITY)
Admission: EM | Admit: 2023-02-03 | Discharge: 2023-02-03 | Disposition: A | Discharge status: Home / Self Care | Payer: Medicaid Other | Attending: Internal Medicine | Admitting: Internal Medicine

## 2023-02-03 ENCOUNTER — Encounter (HOSPITAL_COMMUNITY): Payer: Self-pay | Admitting: Emergency Medicine

## 2023-02-03 DIAGNOSIS — S71112A Laceration without foreign body, left thigh, initial encounter: Secondary | ICD-10-CM | POA: Diagnosis not present

## 2023-02-03 MED ORDER — BACITRACIN ZINC 500 UNIT/GM EX OINT: 1.0000 | TOPICAL_OINTMENT | Freq: Two times a day (BID) | CUTANEOUS | 0 refills | Status: DC

## 2023-02-03 MED ORDER — LIDOCAINE HCL (PF) 1 % IJ SOLN
INTRAMUSCULAR | Status: AC
  Filled 2023-02-03: qty 30

## 2023-02-03 NOTE — ED Triage Notes (Signed)
Pt reports that accidentally stabbed himself in left upper leg with a box cutter. Bleeding controlled at this time by bandage placed by aunt last night. Tetanus shot 2022.

## 2023-02-03 NOTE — ED Provider Notes (Signed)
[facility]    CSN: [id number] Arrival date & time: 02/03/23  1110      History   Chief Complaint Chief Complaint  Patient presents with   Laceration    HPI Shawn Leach is a 42 y.o. male comes to the urgent care with laceration of the left thigh.  This happened yesterday evening.  Patient was working at home when he accidentally stabbed his left thigh with a box cutter.  Bleeding was controlled.  Steri-Strips were placed with good approximation.  Patient came to the urgent care to be evaluated.  Initial evaluation showed a wound with Steri-Strips.  Wound was well-approximated.  I initially advised the patient not to take the Steri-Strips off but rather continue with daily wound dressing.  Patient peeled of the Steri-Strips after he was discharged.  Wound edges was dehisced.   HPI  Past Medical History:  Diagnosis Date   Abdominal spasms 12/05/2019   Bipolar 1 disorder (HCC)    Bradycardia    asymptomatic, normal EKG and BMET   Chronic diarrhea of unknown origin 08/25/2021   Gonococcal urethritis    hx in 5/03   High risk homosexual behavior    Hx of relationship with man 2002-2005, no known disease in the partnet   History of psychiatric disorder    unknown prior diagnosis   Schizoaffective disorder, unspecified type ([place]) 03/16/2007   Diagnosed since childhood - teenage years Has been on medications including Depakote and Seroquel since then  Symptoms are stable but on disability due to this mental d/o  Late 2014: Fired from [employer] due to aggressive behavior 2015 Started attending [facility] in [place]      Schizophrenia ([place])    Shoulder weakness 02/23/2018   Tympanic membrane rupture    history of    Patient Active Problem List   Diagnosis Date Noted   Prediabetes 02/02/2023   Spell of altered consciousness 10/29/2021   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 No pertinent surgical history.     Home Medications    Prior to Admission medications   Medication Sig Start Date End Date Taking? Authorizing Provider  bacitracin ointment Apply 1 Application topically 2 (two) times daily. 02/03/23  Yes Nathanael Krist, Myrene Galas, MD  divalproex (DEPAKOTE ER) 500 MG 24 hr tablet Take 3 tablets by mouth daily 08/17/22   Dorethea Clan, DO    Family History No family history on file.  Social History Social History   Tobacco Use   Smoking status: Former    Types: Cigars    Quit date: 01/14/2011    Years since quitting: 12.0   Smokeless tobacco: Never  Vaping Use   Vaping Use: Never used  Substance Use Topics   Alcohol use: Not Currently   Drug use: Yes    Frequency: 2.0 times per week    Types: Marijuana     Allergies   Patient has no known allergies.   Review of Systems Review of Systems As per HPI  Physical Exam Triage Vital Signs ED Triage Vitals [02/03/23 1212]  Enc Vitals Group     BP 120/81     Pulse Rate 62     Resp 14     Temp 99 F (37.2 C)  Temp src      SpO2 97 %     Weight      Height      Head Circumference      Peak Flow      Pain Score      Pain Loc      Pain Edu?      Excl. in [place]?    No data found.  Updated Vital Signs BP 120/81 (BP Location: Right Arm)   Pulse 62   Temp 99 F (37.2 C)   Resp 14   SpO2 97%   Visual Acuity Right Eye Distance:   Left Eye Distance:   Bilateral Distance:    Right Eye Near:   Left Eye Near:    Bilateral Near:     Physical Exam Vitals and nursing note reviewed.  Cardiovascular:     Rate and Rhythm: Normal rate and regular rhythm.  Skin:    Comments: 1 inch laceration over the left thigh.  Bleeding is controlled.  Neurological:     Mental Status: He is alert.     UC Treatments / Results   Labs (all labs ordered are listed, but only abnormal results are displayed) Labs Reviewed - No data to display  EKG   Radiology No results found.  Procedures Laceration Repair  Date/Time: 02/03/2023 2:12 PM  Performed by: Chase Picket, MD Authorized by: Chase Picket, MD   Consent:    Consent obtained:  Verbal   Consent given by:  Patient   Risks discussed:  Infection and pain   Alternatives discussed:  No treatment Universal protocol:    Patient identity confirmed:  Verbally with patient Anesthesia:    Anesthesia method:  Local infiltration   Local anesthetic:  Lidocaine 1% w/o epi Laceration details:    Location:  Leg   Leg location:  L upper leg   Length (cm):  2   Depth (mm):  4 Pre-procedure details:    Preparation:  Patient was prepped and draped in usual sterile fashion Treatment:    Area cleansed with:  Povidone-iodine   Amount of cleaning:  Standard   Debridement:  None   Undermining:  None Skin repair:    Repair method:  Sutures   Suture size:  4-0   Suture material:  Prolene   Number of sutures:  2 Approximation:    Approximation:  Close Repair type:    Repair type:  Simple Post-procedure details:    Dressing:  Antibiotic ointment and non-adherent dressing   Procedure completion:  Tolerated well, no immediate complications  (including critical care time)  Medications Ordered in UC Medications - No data to display  Initial Impression / Assessment and Plan / UC Course  I have reviewed the triage vital signs and the nursing notes.  Pertinent labs & imaging results that were available during my care of the patient were reviewed by me and considered in my medical decision making (see chart for details).     1.  Left thigh laceration: Suturing completed 2 nonabsorbable sutures placed. Laceration repair instructions given. Patient is advised to return to urgent care in 7 days for suture removal Final Clinical Impressions(s) / UC  Diagnoses   Final diagnoses:  Laceration of skin of left thigh, initial encounter     Discharge Instructions      Please keep the wound area clean and dry Daily wound dressing changes with antibiotic ointment After 72 hours it is okay for the wound to come into  contact with mild soap and water.  Please make sure you wash the soap off completely. Tylenol/Motrin as needed for pain If you notice discharge, redness or swelling please return to urgent care to be reevaluated.   ED Prescriptions     Medication Sig Dispense Auth. Provider   bacitracin ointment Apply 1 Application topically 2 (two) times daily. 120 g Alexanderia Gorby, Myrene Galas, MD      PDMP not reviewed this encounter.   Chase Picket, MD 02/03/23 1415

## 2023-02-03 NOTE — Discharge Instructions (Addendum)
Please keep the wound area clean and dry Daily wound dressing changes with antibiotic ointment After 72 hours it is okay for the wound to come into contact with mild soap and water.  Please make sure you wash the soap off completely. Tylenol/Motrin as needed for pain If you notice discharge, redness or swelling please return to urgent care to be reevaluated.

## 2023-02-09 NOTE — Progress Notes (Unsigned)
CC: urgent are follow up  HPI:  [name] is a 42 y.o. male living with a history stated below and presents today for a follow up after being seen in an urgent care for a laceration of his left thigh. Please see problem based assessment and plan for additional details.  Past Medical History:  Diagnosis Date   Abdominal spasms 12/05/2019   Bipolar 1 disorder (HCC)    Bradycardia    asymptomatic, normal EKG and BMET   Chronic diarrhea of unknown origin 08/25/2021   Gonococcal urethritis    hx in 5/03   High risk homosexual behavior    Hx of relationship with man 2002-2005, no known disease in the partnet   History of psychiatric disorder    unknown prior diagnosis   Schizoaffective disorder, unspecified type ([place]) 03/16/2007   Diagnosed since childhood - teenage years Has been on medications including Depakote and Seroquel since then  Symptoms are stable but on disability due to this mental d/o  Late 2014: Fired from [employer] due to aggressive behavior 2015 Started attending [facility] in [place]      Schizophrenia [facility])    Shoulder weakness 02/23/2018   Tympanic membrane rupture    history of    Current Outpatient Medications on File Prior to Visit  Medication Sig Dispense Refill   bacitracin ointment Apply 1 Application topically 2 (two) times daily. 120 g 0   divalproex (DEPAKOTE ER) 500 MG 24 hr tablet Take 3 tablets by mouth daily 270 tablet 3   No current facility-administered medications on file prior to visit.    No family history on file.  Social History   Socioeconomic History   Marital status: Single    Spouse name: Not on file   Number of children: Not on file   Years of education: Not on file   Highest education level: Not on file  Occupational History   Not on file  Tobacco Use   Smoking status: Former    Types: Cigars    Quit date: 01/14/2011    Years since quitting: 12.0   Smokeless tobacco: Never  Vaping Use   Vaping Use: Never used   Substance and Sexual Activity   Alcohol use: Not Currently   Drug use: Yes    Frequency: 2.0 times per week    Types: Marijuana   Sexual activity: Yes    Partners: Female    Comment: disabled on SSI, single, has a girlfriend (now only heterosexual) and 2 daughters,   Other Topics Concern   Not on file  Social History Narrative      Are you right handed or left handed? Right handed    Are you currently employed ? 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 no   Who lives with you? Lives with aunts    What type of home do you live in: 1 story or 2 story? Has steps        Social Determinants of Health   Financial Resource Strain: Not on file  Food Insecurity: Not on file  Transportation Needs: Not on file  Physical Activity: Not on file  Stress: Not on file  Social Connections: Not on file  Intimate Partner Violence: Not on file    Review of Systems: ROS negative except for what is noted on the assessment and plan.  Vitals:   02/10/23 0935  BP: 120/73  Pulse: (!) 52  Temp: 97.8 F (36.6 C)  TempSrc: Oral  SpO2: 100%  Weight: 143 lb 14.4 oz (65.3 kg)  Height: 5\' 5"  (1.651 m)    Physical Exam: Constitutional: well-appearing middle aged male sitting in cchair, in no acute distress Cardiovascular: regular rate and rhythm MSK: normal bulk and tone, 2 sutures on left thigh, wound is clean/dry/intact Neurological: alert & oriented x 3, no focal deficit Skin: warm and dry Psych: normal mood and behavior  Assessment & Plan:    Patient discussed with Dr. Philipp Ovens  Laceration of left thigh without complication The patient presents for an urgent care follow-up.  He went to an urgent care 1 week ago after he sustained a laceration to his left thigh.  The patient states that he was working at home and accidentally cut himself with a box cutter.  The urgent care provider placed 2 non-absorbable sutures and recommended that he get them  removed in 1 week, thus, the patient presents for suture removal today.  I removed 2 sutures from the left thigh with the assistance of Kerin Perna, CMA. No complications and the wound is healing well.  I advised the patient to keep the area clean and dry. I also advised him to call the [facility] if there is any redness/drainage/warmth, or if the wound re-opens.   Shawn Leach, D.O. [facility], PGY-2 Phone: [phone] Date 02/10/2023 Time 9:56 AM

## 2023-02-10 ENCOUNTER — Ambulatory Visit: Payer: Medicaid Other | Admitting: Internal Medicine

## 2023-02-10 ENCOUNTER — Encounter: Payer: Self-pay | Admitting: Internal Medicine

## 2023-02-10 VITALS — BP 120/73 | HR 52 | Temp 97.8°F | Ht 65.0 in | Wt 143.9 lb

## 2023-02-10 DIAGNOSIS — S71112A Laceration without foreign body, left thigh, initial encounter: Secondary | ICD-10-CM | POA: Diagnosis present

## 2023-02-10 NOTE — Patient Instructions (Signed)
Thank you, [name] for allowing [place] to provide your care today. Today we discussed:  We removed your stitches today! Your wound is healing well - just keep the area clean and dry.   I have ordered the following labs for you:  Lab Orders  No laboratory test(s) ordered today      Referrals ordered today:   Referral Orders  No referral(s) requested today     I have ordered the following medication/changed the following medications:   Stop the following medications: There are no discontinued medications.   Start the following medications: No orders of the defined types were placed in this encounter.    Follow up: 6 months - 7 months for routine office visit   Should you have any questions or concerns please call the internal medicine clinic at [phone].     Buddy Duty, D.O. [place]

## 2023-02-10 NOTE — Assessment & Plan Note (Addendum)
The patient presents for an urgent care follow-up.  He went to an urgent care 1 week ago after he sustained a laceration to his left thigh.  The patient states that he was working at home and accidentally cut himself with a box cutter.  The urgent care provider placed 2 non-absorbable sutures and recommended that he get them removed in 1 week, thus, the patient presents for suture removal today.  I removed 2 sutures from the left thigh with the assistance of Kerin Perna, CMA. No complications and the wound is healing well.  I advised the patient to keep the area clean and dry. I also advised him to call the [facility] if there is any redness/drainage/warmth, or if the wound re-opens.

## 2023-02-15 NOTE — Progress Notes (Signed)
Internal Medicine Clinic Attending ° °Case discussed with Dr. Atway  at the time of the visit.  We reviewed the resident’s history and exam and pertinent patient test results.  I agree with the assessment, diagnosis, and plan of care documented in the resident’s note.  °

## 2023-02-15 NOTE — Progress Notes (Signed)
Internal Medicine Clinic Attending ° °Case discussed with Dr. Atway  At the time of the visit.  We reviewed the resident’s history and exam and pertinent patient test results.  I agree with the assessment, diagnosis, and plan of care documented in the resident’s note.  °

## 2023-02-24 ENCOUNTER — Telehealth (HOSPITAL_COMMUNITY): Payer: Medicaid Other | Admitting: Psychiatry

## 2023-03-07 ENCOUNTER — Telehealth (HOSPITAL_BASED_OUTPATIENT_CLINIC_OR_DEPARTMENT_OTHER): Payer: Medicaid Other | Admitting: Psychiatry

## 2023-03-07 ENCOUNTER — Encounter (HOSPITAL_COMMUNITY): Payer: Self-pay | Admitting: Psychiatry

## 2023-03-07 VITALS — Wt 143.0 lb

## 2023-03-07 DIAGNOSIS — F1210 Cannabis abuse, uncomplicated: Secondary | ICD-10-CM

## 2023-03-07 DIAGNOSIS — F250 Schizoaffective disorder, bipolar type: Secondary | ICD-10-CM
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
## 2023-04-08 ENCOUNTER — Encounter: Payer: Self-pay | Admitting: Neurology

## 2023-04-08 ENCOUNTER — Ambulatory Visit: Payer: Medicaid Other | Admitting: Neurology

## 2023-04-08 DIAGNOSIS — F250 Schizoaffective disorder, bipolar type: Secondary | ICD-10-CM | POA: Diagnosis not present

## 2023-04-08 MED ORDER — DIVALPROEX SODIUM ER 500 MG PO TB24: ORAL_TABLET | ORAL | 3 refills | Status: DC

## 2023-04-08 NOTE — Patient Instructions (Signed)
Good to see you.  Start taking the Depakote ER 500mg  3 tablets every night and see if this helps with the slowed feeling during the day  2. Please follow-up with PCP and discuss the heart beating fast, also checking for vitamin D, B12, and thyroid levels  3. Follow-up in 6 months, call for any changes
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
## 2023-05-17 ENCOUNTER — Encounter: Payer: Self-pay | Admitting: Student

## 2023-05-17 ENCOUNTER — Other Ambulatory Visit: Payer: Self-pay

## 2023-05-17 ENCOUNTER — Ambulatory Visit (HOSPITAL_COMMUNITY)
Admission: RE | Admit: 2023-05-17 | Discharge: 2023-05-17 | Disposition: A | Discharge status: Home / Self Care | Payer: MEDICAID | Source: Ambulatory Visit | Attending: Internal Medicine | Admitting: Internal Medicine

## 2023-05-17 ENCOUNTER — Ambulatory Visit: Payer: MEDICAID | Admitting: Student

## 2023-05-17 VITALS — BP 103/60 | HR 50 | Temp 97.6°F | Wt 139.8 lb

## 2023-05-17 DIAGNOSIS — Z87898 Personal history of other specified conditions: Secondary | ICD-10-CM

## 2023-05-17 DIAGNOSIS — R002 Palpitations: Secondary | ICD-10-CM

## 2023-05-17 DIAGNOSIS — R404 Transient alteration of awareness: Secondary | ICD-10-CM | POA: Diagnosis not present

## 2023-05-17 DIAGNOSIS — M7712 Lateral epicondylitis, left elbow: Secondary | ICD-10-CM

## 2023-05-17 DIAGNOSIS — R001 Bradycardia, unspecified: Secondary | ICD-10-CM | POA: Diagnosis not present

## 2023-05-17 DIAGNOSIS — M7710 Lateral epicondylitis, unspecified elbow: Secondary | ICD-10-CM | POA: Insufficient documentation

## 2023-05-17 DIAGNOSIS — I498 Other specified cardiac arrhythmias: Secondary | ICD-10-CM | POA: Insufficient documentation

## 2023-05-17 MED ORDER — NAPROXEN 500 MG PO TABS
500.0000 mg | ORAL_TABLET | Freq: Two times a day (BID) | ORAL | 2 refills | Status: AC
Start: 2023-05-17 — End: 2024-05-16

## 2023-05-17 NOTE — Assessment & Plan Note (Signed)
Patient has a long history of alcohol use.  Patient reported he has not used alcohol for the past 2 years.  Patient is encouraged to continue on alcohol abstinence and call us when he has any signs of relapse.Alcohol abstinence counseling adequately provided during this visit

## 2023-05-17 NOTE — Patient Instructions (Signed)
Thank you, [name] for allowing [place] to provide your care today. Today we discussed your heart beating fast and your left arm weakness.   - Continue your Depakote as prescribed by your neurologist. - I have prescribed naproxen 500 twice a day for your arm weakness - Please take a rest from activity especially with your left arm - Use ice when needed for your arm - I referred you to a cardiologist for follow-up for your fast heartbeat  - I will see you again in a month for a follow-up  I have ordered the following labs for you:   Lab Orders         BMP8+Anion Gap         CBC no Diff         TSH         Vitamin B12        Referrals ordered today:   Referral Orders         Ambulatory referral to Cardiology      I have ordered the following medication/changed the following medications:   Stop the following medications: There are no discontinued medications.   Start the following medications: Meds ordered this encounter  Medications   naproxen (NAPROSYN) 500 MG tablet    Sig: Take 1 tablet (500 mg total) by mouth 2 (two) times daily with a meal.    Dispense:  60 tablet    Refill:  2     Follow up:  1 MONTH      Should you have any questions or concerns please call the internal medicine clinic at [phone].    Kathleen Lime, M.D [facility]

## 2023-05-17 NOTE — Assessment & Plan Note (Signed)
Patients report a 3 month hx of left arm weakness that is exacerbated by medial rotation.  Patient first noticed the weakness when he was working out in [employer]He denies any tingling or excessive pain during this time.   Plan  - Prescribe Naproxen 500 mg BID  - Counsel on rest and use of ice when needed
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
## 2023-05-17 NOTE — Assessment & Plan Note (Addendum)
Patient reported to his neurologist about a month ago that his heart beat so fast with exertion.  He was then referred by his neurologist back to [place] for further workup.  Patient does reports his heartbeat so fast that he usually has to stop all activity and relax to control the symptoms. Vitals are within normal limits in the office today. - TSH today - CBC, BMP - B12 - EKG ( I reviewed EKG and looks normal but due to the ongoing symptomatic palpitations,cardiology follow up is still warranted) - Ambulatory cardiology referral for cardiac monitoring

## 2023-05-18 LAB — BMP8+ANION GAP
Anion Gap: 16 mmol/L (ref 10.0–18.0)
BUN/Creatinine Ratio: 11 (ref 9–20)
BUN: 11 mg/dL (ref 6–24)
CO2: 25 mmol/L (ref 20–29)
Calcium: 9.3 mg/dL (ref 8.7–10.2)
Chloride: 101 mmol/L (ref 96–106)
Creatinine, Ser: 0.96 mg/dL (ref 0.76–1.27)
Glucose: 66 mg/dL — ABNORMAL LOW (ref 70–99)
Potassium: 4.2 mmol/L (ref 3.5–5.2)
Sodium: 142 mmol/L (ref 134–144)
eGFR: 102 mL/min/{1.73_m2} (ref >59)

## 2023-05-18 LAB — CBC
Hematocrit: 41.7 % (ref 37.5–51.0)
Hemoglobin: 13.7 g/dL (ref 13.0–17.7)
MCH: 29.8 pg (ref 26.6–33.0)
MCHC: 32.9 g/dL (ref 31.5–35.7)
MCV: 91 fL (ref 79–97)
Platelets: 172 10*3/uL (ref 150–450)
RBC: 4.59 x10E6/uL (ref 4.14–5.80)
RDW: 13 % (ref 11.6–15.4)
WBC: 4.4 10*3/uL (ref 3.4–10.8)

## 2023-05-18 LAB — TSH: TSH: 1.07 u[IU]/mL (ref 0.450–4.500)

## 2023-05-18 LAB — VITAMIN B12: Vitamin B-12: 323 pg/mL (ref 232–1245)

## 2023-05-18 NOTE — Progress Notes (Signed)
Internal Medicine Clinic Attending  I was physically present during the key portions of the resident provided service and participated in the medical decision making of patient's management care. I reviewed pertinent patient test results.  The assessment, diagnosis, and plan were formulated together and I agree with the documentation in the resident's note.  Patient here with left elbow pain in the setting of weight lifting. There is no swelling and as a correction to the resident note no weakness on exam - he reports subjective weakness in that arm but only when lifting weights and this seems secondary to the pain. Suspect lateral epicondylitis, treating conservatively.   For these episodes of near syncope with associated palpitations, neurology is following, does not sound entirely consistent with seizures but he is on Depakote. Episodes occur daily; we have referred to cardiology for cardiac monitoring to rule out cardiogenic etiology. EKG here is unremarkable aside from sinus bradycardia with sinus arrhythmia. If cardiac work up is unrevealing, would consider treatment for panic disorder.   Reymundo Poll, MD

## 2023-05-25 ENCOUNTER — Telehealth: Payer: Self-pay

## 2023-05-25 NOTE — Telephone Encounter (Signed)
Will call patient back tomorrow.

## 2023-05-25 NOTE — Telephone Encounter (Signed)
RTC to patient would like to speak with the doctor about his test results in my chart and what they mean.

## 2023-05-25 NOTE — Telephone Encounter (Signed)
Requesting to speak with a nurse about blood sugar. Please call pt back.  

## 2023-07-07 ENCOUNTER — Telehealth: Payer: Self-pay | Admitting: Student

## 2023-07-07 NOTE — Telephone Encounter (Signed)
Pt is calling back about his results .Marland Kitchen He stated that he never received a call back

## 2023-07-11 ENCOUNTER — Ambulatory Visit: Payer: No Typology Code available for payment source | Admitting: Cardiology

## 2023-09-06 ENCOUNTER — Telehealth (HOSPITAL_BASED_OUTPATIENT_CLINIC_OR_DEPARTMENT_OTHER): Payer: MEDICAID | Admitting: Psychiatry

## 2023-09-06 ENCOUNTER — Encounter (HOSPITAL_COMMUNITY): Payer: Self-pay | Admitting: Psychiatry

## 2023-09-06 VITALS — Wt 139.0 lb

## 2023-09-06 DIAGNOSIS — F1210 Cannabis abuse, uncomplicated: Secondary | ICD-10-CM | POA: Diagnosis not present

## 2023-09-06 DIAGNOSIS — F250 Schizoaffective disorder, bipolar type: Secondary | ICD-10-CM | POA: Diagnosis not present

## 2023-09-06 MED ORDER — DIVALPROEX SODIUM ER 500 MG PO TB24
ORAL_TABLET | ORAL | 0 refills | Status: DC
Start: 2023-09-06 — End: 2023-12-07

## 2023-09-06 NOTE — Progress Notes (Signed)
[facility] MD Virtual Progress Note   Patient Location: Home Provider Location: Home Office  I connect with patient by telephone and verified that I am speaking with correct person by using two identifiers. I discussed the limitations of evaluation and management by telemedicine and the availability of in person appointments. I also discussed with the patient that there may be a patient responsible charge related to this service. The patient expressed understanding and agreed to proceed.  Shawn Leach [id number] 42 y.o.  09/06/2023 10:26 AM  History of Present Illness:  Patient is evaluated by phone session.  He is taking Depakote 1500 mg at bedtime.  He recently seen his primary care but Depakote level was not drawn.  He admitted it helps sleep and irritability and anger but he still talked to himself sometime which calm him down.  He continues to smoke marijuana twice a day and he has no plan to cut it down because it keeps him calm.  However he feel probably he has not been drinking for more than 2 years.  He stopped driving after he had a episode where he was felt blank while driving.  He is scared and does not want to drive again.  Sometimes he noticed mild tremors but not sure what triggered that.  His last Depakote level was done last year.  Denies any hallucination, paranoia but admits sometimes feel stressed and talk to himself calm him down.  His appetite is okay.  His weight is stable.  He lives with his children's mother.  One of his child is thinking to go to college.  He has 4 kids.  Denies any suicidal thoughts or homicidal thoughts.  Past Psychiatric History: H/O psychiatric illness since young age.  H/O suicidal thoughts, severe anger, mania and hallucination. Saw psychiatrist at the local mental health agency which closed. H/O one brief stay in the emergency room when intoxicated with alcohol. Took Seroquel (groggy and tired).  No h/o suicidal attempt,  nightmares flashback or OCD symptoms.     Outpatient Encounter Medications as of 09/06/2023  Medication Sig   divalproex (DEPAKOTE ER) 500 MG 24 hr tablet Take 3 tablets by mouth every night   naproxen (NAPROSYN) 500 MG tablet Take 1 tablet (500 mg total) by mouth 2 (two) times daily with a meal.   No facility-administered encounter medications on file as of 09/06/2023.    No results found for this or any previous visit (from the past 2160 hour(s)).   Psychiatric Specialty Exam: Physical Exam  Review of Systems  Weight 139 lb (63 kg).There is no height or weight on file to calculate BMI.  General Appearance: NA  Eye Contact:  NA  Speech:   fast  Volume:  Increased  Mood:  Euthymic  Affect:  Congruent  Thought Process:  Descriptions of Associations: Intact  Orientation:  Full (Time, Place, and Person)  Thought Content:  Rumination  Suicidal Thoughts:  No  Homicidal Thoughts:  No  Memory:  Immediate;   Fair Recent;   Fair Remote;   Fair  Judgement:  Intact  Insight:  Present  Psychomotor Activity:  Increased  Concentration:  Concentration: Fair and Attention Span: Fair  Recall:  [employer] of Knowledge:  Fair  Language:  Fair  Akathisia:  No  Handed:  Right  AIMS (if indicated):     Assets:  Communication Skills Desire for Improvement Housing  ADL's:  Intact  Cognition:  Impaired,  Mild  Sleep:  ok  Assessment/Plan: Schizoaffective disorder, bipolar type (HCC) - Plan: divalproex (DEPAKOTE ER) 500 MG 24 hr tablet  Tetrahydrocannabinol (THC) use disorder, mild, abuse - Plan: divalproex (DEPAKOTE ER) 500 MG 24 hr tablet  Reviewed blood work results.  His CBC and basic chemistry, TSH and B12 are normal.  These labs were done in July.  Recommend to have a Depakote level as noted sometime mild tremors in his hand.  Last Depakote level was more than 100 which was done last year.  Recommend to continue Depakote 1500 mg at bedtime.  We will do Depakote level and send  message to his primary care.  Patient is still smoke marijuana which he believes helps to calm him down.  Recommend to call us back if he has any question or any concern.  Follow-up in 3 months.   Follow Up Instructions:     I discussed the assessment and treatment plan with the patient. The patient was provided an opportunity to ask questions and all were answered. The patient agreed with the plan and demonstrated an understanding of the instructions.   The patient was advised to call back or seek an in-person evaluation if the symptoms worsen or if the condition fails to improve as anticipated.    Collaboration of Care: Other provider involved in patient's care AEB we will send a message to his primary care to do Depakote level.  Patient/Guardian was advised Release of Information must be obtained prior to any record release in order to collaborate their care with an outside provider. Patient/Guardian was advised if they have not already done so to contact the registration department to sign all necessary forms in order for [place] to release information regarding their care.   Consent: Patient/Guardian gives verbal consent for treatment and assignment of benefits for services provided during this visit. Patient/Guardian expressed understanding and agreed to proceed.     I provided 20 minutes of non face to face time during this encounter.  Note: This document was prepared by [employer] voice dictation technology and any errors that results from this process are unintentional.    Cleotis Nipper, MD 09/06/2023

## 2023-09-14 ENCOUNTER — Ambulatory Visit: Payer: MEDICAID | Attending: Cardiology | Admitting: Cardiology

## 2023-09-14 ENCOUNTER — Encounter: Payer: Self-pay | Admitting: Cardiology

## 2023-09-14 ENCOUNTER — Ambulatory Visit (INDEPENDENT_AMBULATORY_CARE_PROVIDER_SITE_OTHER): Payer: MEDICAID

## 2023-09-14 VITALS — BP 100/80 | HR 51 | Ht 65.0 in | Wt 136.6 lb

## 2023-09-14 DIAGNOSIS — R002 Palpitations: Secondary | ICD-10-CM

## 2023-09-14 DIAGNOSIS — R7303 Prediabetes: Secondary | ICD-10-CM

## 2023-09-14 DIAGNOSIS — R42 Dizziness and giddiness: Secondary | ICD-10-CM

## 2023-09-14 DIAGNOSIS — R55 Syncope and collapse: Secondary | ICD-10-CM | POA: Diagnosis not present

## 2023-09-14 NOTE — Patient Instructions (Addendum)
Medication Instructions:  Your physician recommends that you continue on your current medications as directed. Please refer to the Current Medication list given to you today.  *If you need a refill on your cardiac medications before your next appointment, please call your pharmacy*   Lab Work: None  Testing/Procedures: Your physician has requested that you have an echocardiogram. Echocardiography is a painless test that uses sound waves to create images of your heart. It provides your doctor with information about the size and shape of your heart and how well your heart's chambers and valves are working. This procedure takes approximately one hour. There are no restrictions for this procedure. Please do NOT wear cologne, perfume, aftershave, or lotions (deodorant is allowed). Please arrive 15 minutes prior to your appointment time.  ZIO XT- Long Term Monitor Instructions  Your physician has requested you wear a ZIO patch monitor for 14 days.  This is a single patch monitor. [employer] supplies one patch monitor per enrollment. Additional stickers are not available. Please do not apply patch if you will be having a Nuclear Stress Test,  Echocardiogram, Cardiac CT, MRI, or Chest Xray during the period you would be wearing the  monitor. The patch cannot be worn during these tests. You cannot remove and re-apply the  ZIO XT patch monitor.  Your ZIO patch monitor will be mailed 3 day USPS to your address on file. It may take 3-5 days  to receive your monitor after you have been enrolled.  Once you have received your monitor, please review the enclosed instructions. Your monitor  has already been registered assigning a specific monitor serial # to you.  Billing and Patient Assistance Program Information  We have supplied [employer] with any of your insurance information on file for billing purposes. [employer] offers a sliding scale Patient Assistance Program for patients that do not have  insurance,  or whose insurance does not completely cover the cost of the ZIO monitor.  You must apply for the Patient Assistance Program to qualify for this discounted rate.  To apply, please call [employer] at [phone], select option 4, select option 2, ask to apply for  Patient Assistance Program. Meredeth Ide will ask your household income, and how many people  are in your household. They will quote your out-of-pocket cost based on that information.  [employer] will also be able to set up a 9-month, interest-free payment plan if needed.  Applying the monitor   Shave hair from upper left chest.  Hold abrader disc by orange tab. Rub abrader in 40 strokes over the upper left chest as  indicated in your monitor instructions.  Clean area with 4 enclosed alcohol pads. Let dry.  Apply patch as indicated in monitor instructions. Patch will be placed under collarbone on left  side of chest with arrow pointing upward.  Rub patch adhesive wings for 2 minutes. Remove white label marked "1". Remove the white  label marked "2". Rub patch adhesive wings for 2 additional minutes.  While looking in a mirror, press and release button in center of patch. A small green light will  flash 3-4 times. This will be your only indicator that the monitor has been turned on.  Do not shower for the first 24 hours. You may shower after the first 24 hours.  Press the button if you feel a symptom. You will hear a small click. Record Date, Time and  Symptom in the Patient Logbook.  When you are ready to remove the patch, follow instructions  on the last 2 pages of Patient  Logbook. Stick patch monitor onto the last page of Patient Logbook.  Place Patient Logbook in the blue and white box. Use locking tab on box and tape box closed  securely. The blue and white box has prepaid postage on it. Please place it in the mailbox as  soon as possible. Your physician should have your test results approximately 7 days after the  monitor has been  mailed back to [facility].  Call [facility] Customer Care at [phone] if you have questions regarding  your ZIO XT patch monitor. Call them immediately if you see an orange light blinking on your  monitor.  If your monitor falls off in less than 4 days, contact our Monitor department at [phone].  If your monitor becomes loose or falls off after 4 days call [employer] at [phone] for  suggestions on securing your monitor   Follow-Up: At [facility], you and your health needs are our priority.  As part of our continuing mission to provide you with exceptional heart care, we have created designated Provider Care Teams.  These Care Teams include your primary Cardiologist (physician) and Advanced Practice Providers (APPs -  Physician Assistants and Nurse Practitioners) who all work together to provide you with the care you need, when you need it.  Your next appointment:   18 week(s)  Provider:   Thomasene Ripple, DO   Other instructions; Diabetes Mellitus and Nutrition, Adult When you have diabetes, or diabetes mellitus, it is very important to have healthy eating habits because your blood sugar (glucose) levels are greatly affected by what you eat and drink. Eating healthy foods in the right amounts, at about the same times every day, can help you: Manage your blood glucose. Lower your risk of heart disease. Improve your blood pressure. Reach or maintain a healthy weight. What can affect my meal plan? Every person with diabetes is different, and each person has different needs for a meal plan. Your health care provider may recommend that you work with a dietitian to make a meal plan that is best for you. Your meal plan may vary depending on factors such as: The calories you need. The medicines you take. Your weight. Your blood glucose, blood pressure, and cholesterol levels. Your activity level. Other health conditions you have, such as heart or kidney  disease. How do carbohydrates affect me? Carbohydrates, also called carbs, affect your blood glucose level more than any other type of food. Eating carbs raises the amount of glucose in your blood. It is important to know how many carbs you can safely have in each meal. This is different for every person. Your dietitian can help you calculate how many carbs you should have at each meal and for each snack. How does alcohol affect me? Alcohol can cause a decrease in blood glucose (hypoglycemia), especially if you use insulin or take certain diabetes medicines by mouth. Hypoglycemia can be a life-threatening condition. Symptoms of hypoglycemia, such as sleepiness, dizziness, and confusion, are similar to symptoms of having too much alcohol. Do not drink alcohol if: Your health care provider tells you not to drink. You are pregnant, may be pregnant, or are planning to become pregnant. If you drink alcohol: Limit how much you have to: 0-1 drink a day for women. 0-2 drinks a day for men. Know how much alcohol is in your drink. In the [place] one drink equals one 12 oz bottle of beer (355 mL), one 5  oz glass of wine (148 mL), or one 1 oz glass of hard liquor (44 mL). Keep yourself hydrated with water, diet soda, or unsweetened iced tea. Keep in mind that regular soda, juice, and other mixers may contain a lot of sugar and must be counted as carbs. What are tips for following this plan?  Reading food labels Start by checking the serving size on the Nutrition Facts label of packaged foods and drinks. The number of calories and the amount of carbs, fats, and other nutrients listed on the label are based on one serving of the item. Many items contain more than one serving per package. Check the total grams (g) of carbs in one serving. Check the number of grams of saturated fats and trans fats in one serving. Choose foods that have a low amount or none of these fats. Check the number of milligrams (mg) of  salt (sodium) in one serving. Most people should limit total sodium intake to less than 2,300 mg per day. Always check the nutrition information of foods labeled as "low-fat" or "nonfat." These foods may be higher in added sugar or refined carbs and should be avoided. Talk to your dietitian to identify your daily goals for nutrients listed on the label. Shopping Avoid buying canned, pre-made, or processed foods. These foods tend to be high in fat, sodium, and added sugar. Shop around the outside edge of the grocery store. This is where you will most often find fresh fruits and vegetables, bulk grains, fresh meats, and fresh dairy products. Cooking Use low-heat cooking methods, such as baking, instead of high-heat cooking methods, such as deep frying. Cook using healthy oils, such as olive, canola, or sunflower oil. Avoid cooking with butter, cream, or high-fat meats. Meal planning Eat meals and snacks regularly, preferably at the same times every day. Avoid going long periods of time without eating. Eat foods that are high in fiber, such as fresh fruits, vegetables, beans, and whole grains. Eat 4-6 oz (112-168 g) of lean protein each day, such as lean meat, chicken, fish, eggs, or tofu. One ounce (oz) (28 g) of lean protein is equal to: 1 oz (28 g) of meat, chicken, or fish. 1 egg.  cup (62 g) of tofu. Eat some foods each day that contain healthy fats, such as avocado, nuts, seeds, and fish. What foods should I eat? Fruits Berries. Apples. Oranges. Peaches. Apricots. Plums. Grapes. Mangoes. Papayas. Pomegranates. Kiwi. Cherries. Vegetables Leafy greens, including lettuce, spinach, kale, chard, collard greens, mustard greens, and cabbage. Beets. Cauliflower. Broccoli. Carrots. Green beans. Tomatoes. Peppers. Onions. Cucumbers. Brussels sprouts. Grains Whole grains, such as whole-wheat or whole-grain bread, crackers, tortillas, cereal, and pasta. Unsweetened oatmeal. Quinoa. Brown or wild  rice. Meats and other proteins Seafood. Poultry without skin. Lean cuts of poultry and beef. Tofu. Nuts. Seeds. Dairy Low-fat or fat-free dairy products such as milk, yogurt, and cheese. The items listed above may not be a complete list of foods and beverages you can eat and drink. Contact a dietitian for more information. What foods should I avoid? Fruits Fruits canned with syrup. Vegetables Canned vegetables. Frozen vegetables with butter or cream sauce. Grains Refined white flour and flour products such as bread, pasta, snack foods, and cereals. Avoid all processed foods. Meats and other proteins Fatty cuts of meat. Poultry with skin. Breaded or fried meats. Processed meat. Avoid saturated fats. Dairy Full-fat yogurt, cheese, or milk. Beverages Sweetened drinks, such as soda or iced tea. The items listed above may  not be a complete list of foods and beverages you should avoid. Contact a dietitian for more information. Questions to ask a health care provider Do I need to meet with a certified diabetes care and education specialist? Do I need to meet with a dietitian? What number can I call if I have questions? When are the best times to check my blood glucose? Where to find more information: American Diabetes Association: diabetes.org Academy of Nutrition and Dietetics: eatright.[employer] of Diabetes and Digestive and Kidney Diseases: StageSync.si Association of Diabetes Care & Education Specialists: diabeteseducator.org Summary It is important to have healthy eating habits because your blood sugar (glucose) levels are greatly affected by what you eat and drink. It is important to use alcohol carefully. A healthy meal plan will help you manage your blood glucose and lower your risk of heart disease. Your health care provider may recommend that you work with a dietitian to make a meal plan that is best for you. This information is not intended to replace advice given  to you by your health care provider. Make sure you discuss any questions you have with your health care provider. Document Revised: 06/04/2020 Document Reviewed: 06/04/2020 Elsevier Patient Education  2024 Elsevier Inc.   Mediterranean Diet A Mediterranean diet is based on the traditions of countries on the [employer]. It focuses on eating more: Fruits and vegetables. Whole grains, beans, nuts, and seeds. Heart-healthy fats. These are fats that are good for your heart. It involves eating less: Dairy. Meat and eggs. Processed foods with added sugar, salt, and fat. This type of diet can help prevent certain conditions. It can also improve outcomes if you have a long-term (chronic) disease, such as kidney or heart disease. What are tips for following this plan? Reading food labels Check packaged foods for: The serving size. For foods such as rice and pasta, the serving size is the amount of cooked product, not dry. The total fat. Avoid foods with saturated fat or trans fat. Added sugars, such as corn syrup. Shopping  Try to have a balanced diet. Buy a variety of foods, such as: Fresh fruits and vegetables. You may be able to get these from local farmers markets. You can also buy them frozen. Grains, beans, nuts, and seeds. Some of these can be bought in bulk. Fresh seafood. Poultry and eggs. Low-fat dairy products. Buy whole ingredients instead of foods that have already been packaged. If you can't get fresh seafood, buy precooked frozen shrimp or canned fish, such as tuna, salmon, or sardines. Stock your pantry so you always have certain foods on hand, such as olive oil, canned tuna, canned tomatoes, rice, pasta, and beans. Cooking Cook foods with extra-virgin olive oil instead of using butter or other vegetable oils. Have meat as a side dish. Have vegetables or grains as your main dish. This means having meat in small portions or adding small amounts of meat to foods like  pasta or stew. Use beans or vegetables instead of meat in common dishes like chili or lasagna. Try out different cooking methods. Try roasting, broiling, steaming, and sauting vegetables. Add frozen vegetables to soups, stews, pasta, or rice. Add nuts or seeds for added healthy fats and plant protein at each meal. You can add these to yogurt, salads, or vegetable dishes. Marinate fish or vegetables using olive oil, lemon juice, garlic, and fresh herbs. Meal planning Plan to eat a vegetarian meal one day each week. Try to work up to two vegetarian  meals, if possible. Eat seafood two or more times a week. Have healthy snacks on hand. These may include: Vegetable sticks with hummus. Greek yogurt. Fruit and nut trail mix. Eat balanced meals. These should include: Fruit: 2-3 servings a day. Vegetables: 4-5 servings a day. Low-fat dairy: 2 servings a day. Fish, poultry, or lean meat: 1 serving a day. Beans and legumes: 2 or more servings a week. Nuts and seeds: 1-2 servings a day. Whole grains: 6-8 servings a day. Extra-virgin olive oil: 3-4 servings a day. Limit red meat and sweets to just a few servings a month. Lifestyle  Try to cook and eat meals with your family. Drink enough fluid to keep your pee (urine) pale yellow. Be active every day. This includes: Aerobic exercise, which is exercise that causes your heart to beat faster. Examples include running and swimming. Leisure activities like gardening, walking, or housework. Get 7-8 hours of sleep each night. Drink red wine if your provider says you can. A glass of wine is 5 oz (150 mL). You may be allowed to have: Up to 1 glass a day if you're male and not pregnant. Up to 2 glasses a day if you're male. What foods should I eat? Fruits Apples. Apricots. Avocado. Berries. Bananas. Cherries. Dates. Figs. Grapes. Lemons. Melon. Oranges. Peaches. Plums. Pomegranate. Vegetables Artichokes. Beets. Broccoli. Cabbage. Carrots. Eggplant.  Green beans. Chard. Kale. Spinach. Onions. Leeks. Peas. Squash. Tomatoes. Peppers. Radishes. Grains Whole-grain pasta. Brown rice. Bulgur wheat. Polenta. Couscous. Whole-wheat bread. Orpah Cobb. Meats and other proteins Beans. Almonds. Sunflower seeds. Pine nuts. Peanuts. Cod. Salmon. Scallops. Shrimp. Tuna. Tilapia. Clams. Oysters. Eggs. Chicken or [place] without skin. Dairy Low-fat milk. Cheese. Greek yogurt. Fats and oils Extra-virgin olive oil. Avocado oil. Grapeseed oil. Beverages Water. Red wine. Herbal tea. Sweets and desserts Greek yogurt with honey. Baked apples. Poached pears. Trail mix. Seasonings and condiments Basil. Cilantro. Coriander. Cumin. Mint. Parsley. Sage. Rosemary. Tarragon. Garlic. Oregano. Thyme. Pepper. Balsamic vinegar. Tahini. Hummus. Tomato sauce. Olives. Mushrooms. The items listed above may not be all the foods and drinks you can have. Talk to a dietitian to learn more. What foods should I limit? This is a list of foods that should be eaten rarely. Fruits Fruit canned in syrup. Vegetables Deep-fried potatoes, like [place] fries. Grains Packaged pasta or rice dishes. Cereal with added sugar. Snacks with added sugar. Meats and other proteins Beef. Pork. Lamb. Chicken or [place] with skin. Hot dogs. Tomasa Blase. Dairy Ice cream. Sour cream. Whole milk. Fats and oils Butter. Canola oil. Vegetable oil. Beef fat (tallow). Lard. Beverages Juice. Sugar-sweetened soft drinks. Beer. Liquor and spirits. Sweets and desserts Cookies. Cakes. Pies. Candy. Seasonings and condiments Mayonnaise. Pre-made sauces and marinades. The items listed above may not be all the foods and drinks you should limit. Talk to a dietitian to learn more. Where to find more information American Heart Association (AHA): heart.org This information is not intended to replace advice given to you by your health care provider. Make sure you discuss any questions you have with your health care  provider. Document Revised: 02/13/2023 Document Reviewed: 02/13/2023 Elsevier Patient Education  2024 [employer].

## 2023-09-14 NOTE — Progress Notes (Unsigned)
Enrolled patient for a 14 day Zio XT  monitor to be mailed to patients home  °

## 2023-09-14 NOTE — Progress Notes (Signed)
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 Lard. Beverages Juice. Sugar-sweetened soft drinks. Beer. Liquor and spirits. Sweets and desserts Cookies. Cakes. Pies. Candy. Seasonings and condiments Mayonnaise. Pre-made sauces and marinades. The items listed above may  not be all the foods and drinks you should limit. Talk to a dietitian to learn more. Where to find more information American Heart Association (AHA): heart.org This information is not intended to replace advice given to you by your health care provider. Make sure you discuss any questions you have with your health care provider. Document Revised: 02/13/2023 Document Reviewed: 02/13/2023 Elsevier Patient Education  2024 Elsevier Inc.    Adopting a Healthy Lifestyle.  Know what a healthy weight is for you (roughly BMI <25) and aim to maintain this   Aim for 7+ servings of fruits and vegetables daily   65-80+ fluid ounces of water or unsweet tea for  healthy kidneys   Limit to max 1 drink of alcohol per day; avoid smoking/tobacco   Limit animal fats in diet for cholesterol and heart health - choose grass fed whenever available   Avoid highly processed foods, and foods high in saturated/trans fats   Aim for low stress - take time to unwind and care for your mental health   Aim for 150 min of moderate intensity exercise weekly for heart health, and weights twice weekly for bone health   Aim for 7-9 hours of sleep daily   When it comes to diets, agreement about the perfect plan isnt easy to find, even among the experts. Experts at the [facility] [employer] developed an idea known as the Healthy Eating Plate. Just imagine a plate divided into logical, healthy portions.   The emphasis is on diet quality:   Load up on vegetables and fruits - one-half of your plate: Aim for color and variety, and remember that potatoes dont count.   Go for whole grains - one-quarter of your plate: Whole wheat, barley, wheat berries, quinoa, oats, brown rice, and foods made with them. If you want pasta, go with whole wheat pasta.   Protein power - one-quarter of your plate: Fish, chicken, beans, and nuts are all healthy, versatile protein sources. Limit red meat.   The diet, however, does go beyond the plate, offering a few other suggestions.   Use healthy plant oils, such as olive, canola, soy, corn, sunflower and peanut. Check the labels, and avoid partially hydrogenated oil, which have unhealthy trans fats.   If youre thirsty, drink water. Coffee and tea are good in moderation, but skip sugary drinks and limit milk and dairy products to one or two daily servings.   The type of carbohydrate in the diet is more important than the amount. Some sources of carbohydrates, such as vegetables, fruits, whole grains, and beans-are healthier than others.   Finally, stay active  Signed, Thomasene Ripple, DO  09/14/2023 8:23 PM    [facility]

## 2023-09-17 DIAGNOSIS — R42 Dizziness and giddiness: Secondary | ICD-10-CM | POA: Diagnosis not present

## 2023-09-17 DIAGNOSIS — R55 Syncope and collapse: Secondary | ICD-10-CM | POA: Diagnosis not present

## 2023-10-04 ENCOUNTER — Encounter: Payer: Self-pay | Admitting: Internal Medicine

## 2023-10-04 ENCOUNTER — Ambulatory Visit: Payer: MEDICAID | Admitting: Internal Medicine

## 2023-10-04 VITALS — BP 114/73 | HR 43 | Temp 98.1°F | Ht 65.0 in | Wt 133.0 lb

## 2023-10-04 DIAGNOSIS — R7303 Prediabetes: Secondary | ICD-10-CM | POA: Diagnosis not present

## 2023-10-04 DIAGNOSIS — M67911 Unspecified disorder of synovium and tendon, right shoulder: Secondary | ICD-10-CM

## 2023-10-04 DIAGNOSIS — R002 Palpitations: Secondary | ICD-10-CM

## 2023-10-04 DIAGNOSIS — F250 Schizoaffective disorder, bipolar type: Secondary | ICD-10-CM | POA: Diagnosis not present

## 2023-10-04 DIAGNOSIS — Z23 Encounter for immunization: Secondary | ICD-10-CM

## 2023-10-04 DIAGNOSIS — G8929 Other chronic pain: Secondary | ICD-10-CM

## 2023-10-04 DIAGNOSIS — M25511 Pain in right shoulder: Secondary | ICD-10-CM

## 2023-10-04 DIAGNOSIS — Z0000 Encounter for general adult medical examination without abnormal findings: Secondary | ICD-10-CM

## 2023-10-04 LAB — POCT GLYCOSYLATED HEMOGLOBIN (HGB A1C): Hemoglobin A1C: 5.9 % — AB (ref 4.0–5.6)

## 2023-10-04 LAB — GLUCOSE, CAPILLARY: Glucose-Capillary: 158 mg/dL — ABNORMAL HIGH (ref 70–99)

## 2023-10-04 NOTE — Assessment & Plan Note (Signed)
A1c today is 5.9, down from 6.1 in October 2023. Of note, had glucose of 66 on BMP on 7/2 and reports episodes of shakiness and dizziness that resolve with sweets, which he does not like to take. Counseled on drinking water and consuming readily available sugar when these episodes occur. Next A1c check in Nov 2025.

## 2023-10-04 NOTE — Assessment & Plan Note (Signed)
Patient received flu vaccination today

## 2023-10-04 NOTE — Assessment & Plan Note (Addendum)
Pt has been experiencing fast heart beats with exertion. Has seen neurology and cardiology, who recommended ambulatory Zio patch monitor. He has just mailed this back to the cardiologists. He is on Depakote for his schizoaffective disorder and reports taking it every day. At last visit on 7/2 here, TSH, B12, EKG, CBC, and BMP were wnl aside from a glucose of 66. Vitals today are notable for mild hypotension to 98/51, which improved with water.  - Continue Depakote 1500 mg daily - Follow up with cardiology - TTE scheduled 12/11

## 2023-10-04 NOTE — Progress Notes (Deleted)
CC: routine office visit  HPI:  Shawn Leach is a 42 y.o. male living with a history stated below and presents today for a routine office visit to follow up on his chronic medical conditions. Please see problem based assessment and plan for additional details.  Past Medical History:  Diagnosis Date   Abdominal spasms 12/05/2019   Bipolar 1 disorder (HCC)    Bradycardia    asymptomatic, normal EKG and BMET   Chronic diarrhea of unknown origin 08/25/2021   Gonococcal urethritis    hx in 5/03   High risk homosexual behavior    Hx of relationship with man 2002-2005, no known disease in the partnet   History of psychiatric disorder    unknown prior diagnosis   Schizoaffective disorder, unspecified type (HCC) 03/16/2007   Diagnosed since childhood - teenage years Has been on medications including Depakote and Seroquel since then  Symptoms are stable but on disability due to this mental d/o  Late 2014: Fired from [employer] due to aggressive behavior 2015 Started attending [facility] in [place]      Schizophrenia [facility])    Shoulder weakness 02/23/2018   Tympanic membrane rupture    history of    Current Outpatient Medications on File Prior to Visit  Medication Sig Dispense Refill   divalproex (DEPAKOTE ER) 500 MG 24 hr tablet Take 3 tablets by mouth every night 270 tablet 0   naproxen (NAPROSYN) 500 MG tablet Take 1 tablet (500 mg total) by mouth 2 (two) times daily with a meal. 60 tablet 2   No current facility-administered medications on file prior to visit.    No family history on file.  Social History   Socioeconomic History   Marital status: Single    Spouse name: Not on file   Number of children: Not on file   Years of education: Not on file   Highest education level: Not on file  Occupational History   Not on file  Tobacco Use   Smoking status: Former    Types: Cigars    Quit date: 01/14/2011    Years since quitting: 12.7   Smokeless tobacco: Never  Vaping  Use   Vaping status: Never Used  Substance and Sexual Activity   Alcohol use: Yes    Comment: occ   Drug use: Yes    Frequency: 2.0 times per week    Types: Marijuana   Sexual activity: Yes    Partners: Female    Comment: disabled on SSI, single, has a girlfriend (now only heterosexual) and 2 daughters,   Other Topics Concern   Not on file  Social History Narrative      Are you right handed or left handed? Right handed    Are you currently employed ? 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 no   Who lives with you? Lives with aunts    What type of home do you live in: 1 story or 2 story? Has steps        Social Determinants of Health   Financial Resource Strain: Not on file  Food Insecurity: Not on file  Transportation Needs: Not on file  Physical Activity: Not on file  Stress: Not on file  Social Connections: Not on file  Intimate Partner Violence: Not on file    Review of Systems: ROS negative except for what is noted on the assessment and plan.  Vitals:   10/04/23  0840  BP: (!) 98/51  Pulse: (!) 57  Temp: 98.1 F (36.7 C)  TempSrc: Oral  SpO2: 97%  Weight: 133 lb (60.3 kg)  Height: 5\' 5"  (1.651 m)    Physical Exam: Constitutional: well-appearing *** sitting in ***, in no acute distress HENT: normocephalic atraumatic, mucous membranes moist Eyes: conjunctiva non-erythematous Cardiovascular: regular rate and rhythm, no m/r/g Pulmonary/Chest: normal work of breathing on room air, lungs clear to auscultation bilaterally Abdominal: soft, non-tender, non-distended MSK: normal bulk and tone Neurological: alert & oriented x 3, no focal deficit Skin: warm and dry Psych: normal mood and behavior  Assessment & Plan:   98/51  Schizaffective - depakote 1500 mg daily - counselor   Prediabetes - recheck A1c  Palpitations: - TSH, labs wnl  - saw cards in October: heart monitor mailed to him - TTE to evaluate heart  structure and function (not done yet)  Shoulder pain   HCM: - flu shot today  Patient {GC/[id number]::"discussed with","seen with"} Dr. {[id number]::"YNWGNFAO","Z. Hoffman","Mullen","Narendra","Vincent","Guilloud","Lau","Machen"}  No problem-specific Assessment & Plan notes found for this encounter.   Elza Rafter, D.O. [facility] Internal Medicine, PGY-3 Phone: [phone] Date 10/04/2023 Time 8:44 AM

## 2023-10-04 NOTE — Patient Instructions (Signed)
Thank you, Mr. Shawn Leach for allowing [place] to provide your care today. Today we discussed:    Blood sugar: Your A1c today is 5.9, which is still in a good range. Remember that when you get shakiness or lightheaded to sit down, drink water, and eat something that will raise your blood sugar quickly like sweets, crackers, or fruit.  Shoulder pain: Your shoulder pain may be from tendonitis or overuse. Physical therapy will be helpful for stretching and strengthening the muscles around the injury. We have put in that referral for you.  Fast heart rate: Make sure to go to the echo appointment in a few weeks and follow up with your cardiologist.  I have ordered the following labs for you:   Lab Orders         Glucose, capillary         POC Hbg A1C       Referrals ordered today:    Referral Orders         Ambulatory referral to Physical Therapy        Follow up: 6 months, or sooner if your shoulder pain isn't improving with a month of PT    We look forward to seeing you next time. Please call our clinic at [phone] if you have any questions or concerns. The best time to call is Monday-Friday from 9am-4pm, but there is someone available 24/7. If after hours or the weekend, call the main hospital number and ask for the Internal Medicine Resident On-Call. If you need medication refills, please notify your pharmacy one week in advance and they will send [place] a request.   Thank you for trusting me with your care. Wishing you the best!   Thea Alken, MS3

## 2023-10-04 NOTE — Assessment & Plan Note (Addendum)
History since teenage years, on Depakote 1500mg  daily, which patient reports he is taking and has had not side effects on.

## 2023-10-04 NOTE — Progress Notes (Signed)
This is a Psychologist, occupational Note.  The care of the patient was discussed with Dr. Lafonda Mosses and the assessment and plan was formulated with their assistance.  Please see their note for official documentation of the patient encounter.   Subjective:   Patient ID: Shawn Leach male   DOB: 1981/04/23 42 y.o.   MRN: [id number]  HPI: Shawn Leach is a 42 y.o. male presenting for a routine office visit.  Past Medical History:  Diagnosis Date   Abdominal spasms 12/05/2019   Bipolar 1 disorder (HCC)    Bradycardia    asymptomatic, normal EKG and BMET   Chronic diarrhea of unknown origin 08/25/2021   Gonococcal urethritis    hx in 5/03   High risk homosexual behavior    Hx of relationship with man 2002-2005, no known disease in the partnet   History of psychiatric disorder    unknown prior diagnosis   Schizoaffective disorder, unspecified type (HCC) 03/16/2007   Diagnosed since childhood - teenage years Has been on medications including Depakote and Seroquel since then  Symptoms are stable but on disability due to this mental d/o  Late 2014: Fired from [employer] due to aggressive behavior 2015 Started attending [facility] in [place]      Schizophrenia [facility])    Shoulder weakness 02/23/2018   Tympanic membrane rupture    history of   Current Outpatient Medications  Medication Sig Dispense Refill   divalproex (DEPAKOTE ER) 500 MG 24 hr tablet Take 3 tablets by mouth every night 270 tablet 0   naproxen (NAPROSYN) 500 MG tablet Take 1 tablet (500 mg total) by mouth 2 (two) times daily with a meal. 60 tablet 2   No current facility-administered medications for this visit.   No family history on file. Social History   Socioeconomic History   Marital status: Single    Spouse name: Not on file   Number of children: Not on file   Years of education: Not on file   Highest education level: Not on file  Occupational History   Not on file  Tobacco Use   Smoking status: Former     Types: Cigars    Quit date: 01/14/2011    Years since quitting: 12.7   Smokeless tobacco: Never  Vaping Use   Vaping status: Never Used  Substance and Sexual Activity   Alcohol use: Yes    Comment: occ   Drug use: Yes    Frequency: 2.0 times per week    Types: Marijuana   Sexual activity: Yes    Partners: Female    Comment: disabled on SSI, single, has a girlfriend (now only heterosexual) and 2 daughters,   Other Topics Concern   Not on file  Social History Narrative      Are you right handed or left handed? Right handed    Are you currently employed ? 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 no   Who lives with you? Lives with aunts    What type of home do you live in: 1 story or 2 story? Has steps        Social Determinants of Health   Financial Resource Strain: Medium Risk (10/04/2023)   Overall Financial Resource Strain (CARDIA)    Difficulty of Paying Living Expenses: Somewhat hard  Food Insecurity: No Food Insecurity (10/04/2023)   Hunger Vital Sign    Worried About Programme researcher, broadcasting/film/video in  the Last Year: Never true    Ran Out of Food in the Last Year: Never true  Transportation Needs: No Transportation Needs (10/04/2023)   PRAPARE - Administrator, Civil Service (Medical): No    Lack of Transportation (Non-Medical): No  Physical Activity: Sufficiently Active (10/04/2023)   Exercise Vital Sign    Days of Exercise per Week: 3 days    Minutes of Exercise per Session: 90 min  Stress: Stress Concern Present (10/04/2023)   [employer] of Occupational Health - Occupational Stress Questionnaire    Feeling of Stress : To some extent  Social Connections: Moderately Isolated (10/04/2023)   Social Connection and Isolation Panel [NHANES]    Frequency of Communication with Friends and Family: Once a week    Frequency of Social Gatherings with Friends and Family: Twice a week    Attends Religious Services: More than 4 times  per year    Active Member of [employer] or Organizations: No    Attends Banker Meetings: Never    Marital Status: Never married   Review of Systems: Pertinent items noted in HPI and remainder of comprehensive ROS otherwise negative.  Objective:  Physical Exam: Vitals:   10/04/23 0840 10/04/23 0932  BP: (!) 98/51 114/73  Pulse: (!) 57 (!) 43  Temp: 98.1 F (36.7 C)   TempSrc: Oral   SpO2: 97%   Weight: 133 lb (60.3 kg)   Height: 5\' 5"  (1.651 m)    BP 114/73 (BP Location: Right Arm, Patient Position: Sitting, Cuff Size: Normal)   Pulse (!) 43   Temp 98.1 F (36.7 C) (Oral)   Ht 5\' 5"  (1.651 m)   Wt 133 lb (60.3 kg)   SpO2 97%   BMI 22.13 kg/m  General appearance: alert, cooperative, and no distress Lungs: clear to auscultation bilaterally Heart: regular rate and rhythm, S1, S2 normal, no murmur, click, rub or gallop Extremities: Full ROM in bilateral shoulders. Neg empty beer can sign. Mild tenderness to palpation over the deltoid.  Assessment & Plan:  Palpitations Pt has been experiencing fast heart beats with exertion. Has seen neurology and cardiology, who recommended ambulatory Zio patch monitor. He has just mailed this back to the cardiologists. He is on Depakote for his schizoaffective disorder and reports taking it every day. At last visit on 7/2 here, TSH, B12, EKG, CBC, and BMP were wnl aside from a glucose of 66. Vitals today are notable for mild hypotension to 98/51, which improved with water.  - Continue Depakote 1500 mg daily - Follow up with cardiology - TTE scheduled 12/11  Schizoaffective disorder, bipolar type (HCC) History since teenage years, on Depakote 1500mg  daily, which patient reports he is taking and has had not side effects on.  Prediabetes A1c today is 5.9, down from 6.1 in October 2023. Of note, had glucose of 66 on BMP on 7/2 and reports episodes of shakiness and dizziness that resolve with sweets, which he does not like to take.  Counseled on drinking water and consuming readily available sugar when these episodes occur. Next A1c check in Nov 2025.   Health care maintenance Patient received flu vaccination today.  Chronic shoulder pain Patient reports 3 year long history of R>L shoulder stiffness and easy fatigability. When he lefts weights or needs to carry heavier objects, particularly over his head, he feels a twinge in his shoulder which progresses to shaking and giving out of the arm if he doesn't stop the activity. He states that  at these times, if he doesn't pop the joint, then it will stiffen up and be difficult to use for some time afterwards. No tingling or burning at these times. He is continuing to lift weights, do yardwork, and perform other activities around the house. He is currently on Naproxen 500 mg BID, which he is not sure is helping.   Given the chronic nature, the ongoing activity, and his benign physical exam findings, this is unlikely to be an acute tear or impingement syndrome. Possible overuse injury that has not been able to fully resolve. Specifically, a biceps tendinopathy could be possible given increased symptoms with overhead movements and repetitive movements like working out.  - Continue Naproxen 500 mg BID  - PT referral placed   Thea Alken, MS3

## 2023-10-04 NOTE — Assessment & Plan Note (Signed)
Patient reports 3 year long history of R>L shoulder stiffness and easy fatigability. When he lefts weights or needs to carry heavier objects, particularly over his head, he feels a twinge in his shoulder which progresses to shaking and giving out of the arm if he doesn't stop the activity. He states that at these times, if he doesn't pop the joint, then it will stiffen up and be difficult to use for some time afterwards. No tingling or burning at these times. He is continuing to lift weights, do yardwork, and perform other activities around the house. He is currently on Naproxen 500 mg BID, which he is not sure is helping.   Given the chronic nature, the ongoing activity, and his benign physical exam findings, this is unlikely to be an acute tear or impingement syndrome. Possible overuse injury that has not been able to fully resolve. Specifically, a biceps tendinopathy could be possible given increased symptoms with overhead movements and repetitive movements like working out.  - Continue Naproxen 500 mg BID  - PT referral placed

## 2023-10-05 NOTE — Progress Notes (Signed)
Internal Medicine Clinic Attending  Case discussed with the resident at the time of the visit.  We reviewed the resident's history and exam and pertinent patient test results.  I agree with the assessment, diagnosis, and plan of care documented in the resident's note.  

## 2023-10-26 ENCOUNTER — Ambulatory Visit (HOSPITAL_COMMUNITY): Payer: MEDICAID | Attending: Cardiology

## 2023-10-26 DIAGNOSIS — R42 Dizziness and giddiness: Secondary | ICD-10-CM | POA: Diagnosis present

## 2023-10-26 DIAGNOSIS — R55 Syncope and collapse: Secondary | ICD-10-CM | POA: Diagnosis not present

## 2023-10-26 LAB — ECHOCARDIOGRAM COMPLETE
Area-P 1/2: 3.48 cm2
S' Lateral: 2.4 cm

## 2023-11-08 ENCOUNTER — Ambulatory Visit: Payer: MEDICAID | Admitting: Neurology

## 2023-11-08 ENCOUNTER — Encounter: Payer: Self-pay | Admitting: Neurology

## 2023-11-08 VITALS — BP 116/69 | HR 49 | Ht 68.0 in | Wt 134.2 lb

## 2023-11-08 DIAGNOSIS — F250 Schizoaffective disorder, bipolar type: Secondary | ICD-10-CM

## 2023-11-08 DIAGNOSIS — R404 Transient alteration of awareness: Secondary | ICD-10-CM

## 2023-11-08 NOTE — Patient Instructions (Signed)
Good to see you.  Have bloodwork for Depakote level today  2. Continue Depakote, further refills can be from Psychiatry or PCP  3. Discuss issues with inability to sit still and sleep difficulty with Psychiatry  4. Follow-up as needed, call for any changes
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
## 2023-11-10 ENCOUNTER — Other Ambulatory Visit: Payer: MEDICAID

## 2023-11-11 LAB — VALPROIC ACID LEVEL: Valproic Acid Lvl: <12.5 mg/L — ABNORMAL LOW (ref 50.0–100.0)

## 2023-11-21 ENCOUNTER — Ambulatory Visit: Payer: MEDICAID | Attending: Internal Medicine

## 2023-11-21 ENCOUNTER — Other Ambulatory Visit: Payer: Self-pay

## 2023-11-21 DIAGNOSIS — M25511 Pain in right shoulder: Secondary | ICD-10-CM | POA: Diagnosis present

## 2023-11-21 DIAGNOSIS — G8929 Other chronic pain: Secondary | ICD-10-CM | POA: Diagnosis present

## 2023-11-21 DIAGNOSIS — M6281 Muscle weakness (generalized): Secondary | ICD-10-CM | POA: Diagnosis present

## 2023-11-21 DIAGNOSIS — M67911 Unspecified disorder of synovium and tendon, right shoulder: Secondary | ICD-10-CM | POA: Insufficient documentation

## 2023-11-21 DIAGNOSIS — M25512 Pain in left shoulder: Secondary | ICD-10-CM | POA: Diagnosis present
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
## 2023-11-22 ENCOUNTER — Telehealth: Payer: Self-pay

## 2023-11-22 NOTE — Telephone Encounter (Signed)
-----   Message from Darice CHRISTELLA Shivers sent at 11/21/2023 12:38 PM EST ----- Pls let him know the Depakote  level was very low. He said he had not taken the dose that morning, but had he missed any other doses before that morning? If so, pls have him keep an alarm as reminder to take the medication regularly, f/u with Psychiatry. thanks

## 2023-11-22 NOTE — Telephone Encounter (Signed)
 Pt called informed Depakote level was very low. He said he had not taken the dose that morning, but had he missed any other doses before that morning? Pt stated that me may miss meds on Sundays. Pt advised to set an alarm  f/u with Psychiatry

## 2023-11-29 ENCOUNTER — Ambulatory Visit: Payer: MEDICAID

## 2023-12-06 ENCOUNTER — Ambulatory Visit: Payer: MEDICAID

## 2023-12-06 DIAGNOSIS — G8929 Other chronic pain: Secondary | ICD-10-CM

## 2023-12-06 DIAGNOSIS — M25511 Pain in right shoulder: Secondary | ICD-10-CM | POA: Diagnosis not present

## 2023-12-06 DIAGNOSIS — M6281 Muscle weakness (generalized): Secondary | ICD-10-CM

## 2023-12-06 NOTE — Therapy (Signed)
OUTPATIENT PHYSICAL THERAPY UPPER EXTREMITY TREATMENT NOTE   Patient Name: Shawn Leach MRN: [id number] DOB:December 15, 1980, 43 y.o., male Today's Date: 12/06/2023  END OF SESSION:  PT End of Session - 12/06/23 0844     Visit Number 2    Number of Visits 9    Date for PT Re-Evaluation 01/16/24    Authorization Type [employer]    PT Start Time [phone]    PT Stop Time 0930    PT Time Calculation (min) 45 min    Activity Tolerance Patient tolerated treatment well    Behavior During Therapy [facility] for tasks assessed/performed              Past Medical History:  Diagnosis Date   Abdominal spasms 12/05/2019   Bipolar 1 disorder (HCC)    Bradycardia    asymptomatic, normal EKG and BMET   Chronic diarrhea of unknown origin 08/25/2021   Gonococcal urethritis    hx in 5/03   High risk homosexual behavior    Hx of relationship with man 2002-2005, no known disease in the partnet   History of psychiatric disorder    unknown prior diagnosis   Schizoaffective disorder, unspecified type (HCC) 03/16/2007   Diagnosed since childhood - teenage years Has been on medications including Depakote and Seroquel since then  Symptoms are stable but on disability due to this mental d/o  Late 2014: Fired from [employer] due to aggressive behavior 2015 Started attending [facility] in [place]      Schizophrenia [facility])    Shoulder weakness 02/23/2018   Tympanic membrane rupture    history of   History reviewed. No pertinent surgical history. 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   Back pain 06/05/2017   Encounter for immunization 07/08/2016   Adjustment disorder with emotional disturbance 06/28/2014   Health care maintenance 01/03/2013    History of alcohol use 01/03/2013   Schizoaffective disorder, bipolar type (HCC) 03/16/2007    PCP: Elza Rafter, DO  REFERRING PROVIDER: Gust Rung, DO  REFERRING DIAG: [phone] (ICD-10-CM) - 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 Pain is intermittent. Hand dominance: Right  PERTINENT HISTORY: Possible absent seizures (based on patient history)- light sensitivity.   PAIN:  Are you having pain? Yes: NPRS scale: No pain in bil shoulders currently. 8/10 at worst on R and 6/10 at worst on L Pain location: Bil shoulders Pain description: sharp, dull ache Aggravating factors: reaching OH, sleeping Relieving factors: rest  PRECAUTIONS: None    WEIGHT BEARING RESTRICTIONS: No  FALLS:  Has patient fallen in last 6 months? No  L OCCUPATION: Not working  PLOF: Independent  PATIENT GOALS: improve shoulder pain    OBJECTIVE:  Note: Objective measures were completed at Evaluation unless otherwise noted.  DIAGNOSTIC FINDINGS:  none  PATIENT SURVEYS :  UEFS 59/80  COGNITION: Overall cognitive status: Within functional limits for tasks assessed     SENSATION: WFL    UPPER EXTREMITY ROM:   Active ROM Right eval Left eval  Shoulder flexion    Shoulder extension    Shoulder abduction    Shoulder adduction    Shoulder internal rotation    Shoulder external rotation    Elbow flexion    Elbow extension    Wrist flexion    Wrist extension    Wrist ulnar deviation    Wrist radial deviation    Wrist pronation    Wrist supination    (Blank rows = not tested)  UPPER  EXTREMITY MMT:  MMT Right eval Left eval  Shoulder flexion    Shoulder extension    Shoulder abduction    Shoulder adduction    Shoulder internal rotation    Shoulder external rotation    Middle trapezius    Lower trapezius    Elbow flexion    Elbow extension    Wrist flexion    Wrist extension    Wrist ulnar deviation    Wrist radial deviation    Wrist pronation    Wrist supination    Grip strength (lbs)    (Blank rows = not tested)  SHOULDER SPECIAL TESTS: Impingement tests: Neer impingement test: negative SLAP lesions: Biceps load test: negative  Rotator cuff assessment: Empty can test: negative, Full can test: negative, and Infraspinatus test: negative Biceps assessment: Speed's test: negative  JOINT MOBILITY TESTING:  WNL  PALPATION:  Pt reported no tenderness over proximal biceps tendon, supraspinatus tendon on R side                                                                                                                              TREATMENT DATE:   12/06/23 AROM WFL in bil shoulders with early capsular end feel with end range flexion, ER and IR. 90/90 IR being most restricted with L>R Neer impingement, speed's test negative bil Grade IV posterior capsule mobilization bil  TherEx: Supine 90 deg flexion manual perturbations: 2' each arm Sleeper stretch: 10 x 10" R and L SL shoulder ER: 2lbs 3 x 10  R and L Serratus press: supine 90 deg flexion and 120 deg flexion: 2lbs in each arm: 10x in each position Seated scaption raises: 2 x 10 R and L Wall slides: 20x R and L UBE: 5' level 6  No charge for exericses per Medicaid guidelines.  PATIENT EDUCATION: Education details: see above Person educated: Patient Education method: Explanation Education comprehension: verbalized understanding  HOME EXERCISE PROGRAM: Access Code: [email or web address] URL: [email or web address] Date: 11/21/2023 Prepared by: Lavone Nian  Exercises - Standing  Shoulder Abduction Slides at Wall  - 2 x daily - 7 x weekly - 10-20 reps - Shoulder External Rotation and Scapular Retraction with Resistance  - 1 x daily - 7 x weekly - 2 sets - 10 reps - Shoulder PNF D2 with Resistance  - 1 x daily - 7 x weekly - 2 sets - 10 reps  ASSESSMENT:  CLINICAL IMPRESSION: End range flexion and ER and IR ROM improved with improved capsular end feel with manual therapy. Pt reported fatigue with ER exercises.    OBJECTIVE IMPAIRMENTS: decreased ROM, decreased strength, hypomobility, increased muscle spasms, impaired flexibility, impaired UE functional use, postural dysfunction, and pain.   ACTIVITY LIMITATIONS: carrying, lifting, and reach over head  PARTICIPATION LIMITATIONS: meal prep, cleaning, and driving  PERSONAL FACTORS: Time since onset of injury/illness/exacerbation are also affecting patient's functional outcome.   REHAB POTENTIAL: Good  CLINICAL DECISION MAKING: Stable/uncomplicated  EVALUATION COMPLEXITY: Low  GOALS: Goals reviewed with patient? Yes  SHORT TERM GOALS= 12/19/2023   Pt will be I and compliant with HEP 50% of the time to self manage symptoms Baseline: issued at Eval Goal status: INITIAL  LONG TERM GOALS: Target date: 01/16/2024    Pt will report 10% improvement on disability score of UEFS to improve overall function. Baseline: 59/80 UEFS = 26% disability(11/21/23) Goal status: INITIAL  2.  Pt will report of <4/10 pain in bil shoulders at worst to improve overall function. Baseline: 8/10 max in R shoulder; 6/10 max in L shoulder (11/21/23) Goal status: INITIAL  3.  Pt will report 100% compliance with HEP to self manage his symptoms.  Baseline: issued at eval Goal status: INITIAL   PLAN: PT FREQUENCY: 1x/week  PT DURATION: 8 weeks  PLANNED INTERVENTIONS: 97164- PT Re-evaluation, 97110-Therapeutic exercises, 97530- Therapeutic activity, 97112- Neuromuscular re-education, 97535- Self Care, 13244- Manual therapy,  Patient/Family education, Dry Needling, Joint mobilization, Cryotherapy, and Moist heat  PLAN FOR NEXT SESSION: Review and progress HEP  For all possible CPT codes, reference the Planned Interventions line above.     Check all conditions that are expected to impact treatment: {Conditions expected to impact treatment:Musculoskeletal disorders   If treatment provided at initial evaluation, no treatment charged due to lack of authorization.        Ileana Ladd, PT 12/06/2023, 9:25 AM

## 2023-12-07 ENCOUNTER — Telehealth (HOSPITAL_COMMUNITY): Payer: Self-pay

## 2023-12-07 ENCOUNTER — Telehealth (HOSPITAL_COMMUNITY): Payer: Medicaid Other | Admitting: Psychiatry

## 2023-12-07 ENCOUNTER — Encounter (HOSPITAL_COMMUNITY): Payer: Self-pay | Admitting: Psychiatry

## 2023-12-07 VITALS — Wt 134.0 lb

## 2023-12-07 DIAGNOSIS — F250 Schizoaffective disorder, bipolar type: Secondary | ICD-10-CM | POA: Diagnosis not present

## 2023-12-07 DIAGNOSIS — F1210 Cannabis abuse, uncomplicated: Secondary | ICD-10-CM

## 2023-12-07 MED ORDER — DIVALPROEX SODIUM ER 500 MG PO TB24: ORAL_TABLET | ORAL | 0 refills | Status: DC

## 2023-12-07 MED ORDER — QUETIAPINE FUMARATE 50 MG PO TABS: 50.0000 mg | ORAL_TABLET | Freq: Every day | ORAL | 0 refills | Status: DC

## 2023-12-07 NOTE — Telephone Encounter (Addendum)
Prior auth submitted on cover my meds this morning for patients Seroquel 50 mg tablets     Medication approved 12/07/2023 - 12/06/2024 (all strengths approved)

## 2023-12-07 NOTE — Progress Notes (Addendum)
Health MD Virtual Progress Note   Patient Location: Home Provider Location: Home Office  I connect with patient by video and verified that I am speaking with correct person by using two identifiers. I discussed the limitations of evaluation and management by telemedicine and the availability of in person appointments. I also discussed with the patient that there may be a patient responsible charge related to this service. The patient expressed understanding and agreed to proceed.  Shawn Leach [id number] 43 y.o.  12/07/2023 8:27 AM  History of Present Illness:  Patient is evaluated by video session.  He reported lately not sleeping, getting restless at night and watching TV very late.  Next day he feels very tired.  During the session he appears somewhat irritable, labile, getting distracted with pressured speech.  He continues to smoke marijuana twice a day and he has no plan to stop to cut it down.  He feels the marijuana coming down.  He is not drinking for more than 2 years.  Patient lives with his children's mother and they have 60 year old 40 year old 41 year old and 41 year old.  Denies any mania or suicidal thoughts but sometimes talk to himself.  His appetite is okay.  His weight is unchanged from the past.  He gets easily distracted and thought processes circumstantial.  He had a blood work and hemoglobin A1c 5.9, Depakote 12.5.  He claimed that he is taking the medication but taking 1 pill in the morning and 2 at bedtime.  Later he told that he is using the previous bottle because he has lot of leftovers as in the past he was not taking the medication consistently.  He continues to see a therapist every 2 weeks.  He has some paranoia but no recent hallucinations.  I also reviewed the notes from neurology.  Patient is seeing cardiology for possible SVT.  He has appointment coming up soon.  Past Psychiatric History: H/O psychiatric illness since young age.  H/O  suicidal thoughts, severe anger, mania and hallucination. Saw psychiatrist at the local mental health agency which closed. H/O one brief stay in the emergency room when intoxicated with alcohol. Took Seroquel (groggy and tired).  No h/o suicidal attempt, nightmares flashback or OCD symptoms.     Outpatient Encounter Medications as of 12/07/2023  Medication Sig   divalproex (DEPAKOTE ER) 500 MG 24 hr tablet Take 3 tablets by mouth every night   naproxen (NAPROSYN) 500 MG tablet Take 1 tablet (500 mg total) by mouth 2 (two) times daily with a meal.   No facility-administered encounter medications on file as of 12/07/2023.    Recent Results (from the past 2160 hours)  POC Hbg A1C     Status: Abnormal   Collection Time: 10/04/23  8:48 AM  Result Value Ref Range   Hemoglobin A1C 5.9 (A) 4.0 - 5.6 %   HbA1c POC (<> result, manual entry)     HbA1c, POC (prediabetic range)     HbA1c, POC (controlled diabetic range)    Glucose, capillary     Status: Abnormal   Collection Time: 10/04/23  8:48 AM  Result Value Ref Range   Glucose-Capillary 158 (H) 70 - 99 mg/dL    Comment: Glucose reference range applies only to samples taken after fasting for at least 8 hours.  ECHOCARDIOGRAM COMPLETE     Status: None   Collection Time: 10/26/23  9:48 AM  Result Value Ref Range   Area-P 1/2 3.48 cm2   S' Lateral 2.40 cm  Est EF 55 - 60%   Valproic Acid level     Status: Abnormal   Collection Time: 11/10/23 12:04 PM  Result Value Ref Range   Valproic Acid Lvl <12.5 (L) 50.0 - 100.0 mg/L     Psychiatric Specialty Exam: Physical Exam  Review of Systems  Weight 134 lb (60.8 kg).There is no height or weight on file to calculate BMI.  General Appearance: Casual  Eye Contact:  Fair  Speech:   fast  Volume:  Increased  Mood:  Irritable  Affect:  Labile  Thought Process:  Descriptions of Associations: Circumstantial  Orientation:  Full (Time, Place, and Person)  Thought Content:  Rumination  Suicidal  Thoughts:  No  Homicidal Thoughts:  No  Memory:  Immediate;   Fair Recent;   Fair Remote;   Fair  Judgement:  Fair  Insight:  Fair  Psychomotor Activity:  Increased  Concentration:  Concentration: Fair and Attention Span: Fair  Recall:  [employer] of Knowledge:  Fair  Language:  Good  Akathisia:  No  Handed:  Right  AIMS (if indicated):     Assets:  Communication Skills Desire for Improvement Housing Transportation  ADL's:  Intact  Cognition:  WNL  Sleep:  fair     Assessment/Plan: Schizoaffective disorder, bipolar type (HCC) - Plan: divalproex (DEPAKOTE ER) 500 MG 24 hr tablet, QUEtiapine (SEROQUEL) 50 MG tablet  Tetrahydrocannabinol (THC) use disorder, mild, abuse - Plan: divalproex (DEPAKOTE ER) 500 MG 24 hr tablet  Discussed taking the medication as prescribed and not missing the dose.  Reviewed blood work results.  Depakote level less than 12.5.  Not sure if he is taking the medication regularly.  He appears very restless, labile, anxious and having poor sleep.  I reviewed notes from neurology.  As per neurology no new episodes of unconsciousness but having referred to cardiology for possible SVT.  He continues to smoke marijuana to keep himself calm.  Recommend to add low-dose Seroquel to help sleep at night.  We will repeat the Depakote level on his next appointment.  Encouraged to take the Depakote 1500 mg at bedtime unless he feels very sleepy and groggy then he can move 1 pill in the morning.  Encouraged to continue therapy every 2 weeks.  Recommend to keep appointment with cardiology to rule out SVT.  Will follow up in 4 weeks.  Recommend to call us back if he notices worsening of symptoms or having any side effects from the medication.  He is also taking Naprosyn for his pain.   Follow Up Instructions:     I discussed the assessment and treatment plan with the patient. The patient was provided an opportunity to ask questions and all were answered. The patient agreed  with the plan and demonstrated an understanding of the instructions.   The patient was advised to call back or seek an in-person evaluation if the symptoms worsen or if the condition fails to improve as anticipated.    Collaboration of Care: Other provider involved in patient's care AEB notes are available in epic to review  Patient/Guardian was advised Release of Information must be obtained prior to any record release in order to collaborate their care with an outside provider. Patient/Guardian was advised if they have not already done so to contact the registration department to sign all necessary forms in order for [place] to release information regarding their care.   Consent: Patient/Guardian gives verbal consent for treatment and assignment of benefits for services provided  during this visit. Patient/Guardian expressed understanding and agreed to proceed.     I provided 27 minutes of non face to face time during this encounter.  Note: This document was prepared by [employer] voice dictation technology and any errors that results from this process are unintentional.    Cleotis Nipper, MD 12/07/2023

## 2023-12-13 ENCOUNTER — Ambulatory Visit: Payer: MEDICAID

## 2023-12-13 DIAGNOSIS — M6281 Muscle weakness (generalized): Secondary | ICD-10-CM

## 2023-12-13 DIAGNOSIS — M25511 Pain in right shoulder: Secondary | ICD-10-CM | POA: Diagnosis not present

## 2023-12-13 DIAGNOSIS — G8929 Other chronic pain: Secondary | ICD-10-CM

## 2023-12-13 NOTE — Therapy (Signed)
OUTPATIENT PHYSICAL THERAPY UPPER EXTREMITY TREATMENT NOTE   Patient Name: Shawn Leach MRN: [id number] DOB:May 31, 1981, 43 y.o., male Today's Date: 12/13/2023  END OF SESSION:  PT End of Session - 12/13/23 0735     Visit Number 3    Number of Visits 9    Date for PT Re-Evaluation 01/16/24    Authorization Type [employer]    PT Start Time 0740    PT Stop Time 0825    PT Time Calculation (min) 45 min    Activity Tolerance Patient tolerated treatment well    Behavior During Therapy [facility] for tasks assessed/performed              Past Medical History:  Diagnosis Date   Abdominal spasms 12/05/2019   Bipolar 1 disorder (HCC)    Bradycardia    asymptomatic, normal EKG and BMET   Chronic diarrhea of unknown origin 08/25/2021   Gonococcal urethritis    hx in 5/03   High risk homosexual behavior    Hx of relationship with man 2002-2005, no known disease in the partnet   History of psychiatric disorder    unknown prior diagnosis   Schizoaffective disorder, unspecified type (HCC) 03/16/2007   Diagnosed since childhood - teenage years Has been on medications including Depakote and Seroquel since then  Symptoms are stable but on disability due to this mental d/o  Late 2014: Fired from [employer] due to aggressive behavior 2015 Started attending [facility] in [place]      Schizophrenia [facility])    Shoulder weakness 02/23/2018   Tympanic membrane rupture    history of   History reviewed. No pertinent surgical history. 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 Pain is getting better in both shoulders with movement. Hand dominance: Right  PERTINENT HISTORY: Possible absent seizures (based on patient history)- light sensitivity.   PAIN:  Are you having pain? Yes: NPRS scale: No pain in bil shoulders currently.Pain location: Bil shoulders Pain description: sharp, dull ache Aggravating factors: reaching [place], sleeping Relieving factors: rest  PRECAUTIONS: None    WEIGHT BEARING RESTRICTIONS: No  FALLS:  Has patient fallen in last 6 months? No  L OCCUPATION: Not working  PLOF: Independent  PATIENT GOALS: improve shoulder pain    OBJECTIVE:  Note: Objective measures were completed at Evaluation unless otherwise noted.  DIAGNOSTIC FINDINGS:  none  PATIENT SURVEYS :  UEFS 59/80  COGNITION: Overall cognitive status: Within functional limits for tasks assessed     SENSATION: WFL    UPPER EXTREMITY ROM:   Active ROM Right eval Left eval  Shoulder flexion    Shoulder extension    Shoulder abduction    Shoulder adduction    Shoulder internal rotation    Shoulder external rotation    Elbow flexion    Elbow extension    Wrist flexion    Wrist extension    Wrist ulnar deviation    Wrist radial deviation    Wrist pronation    Wrist supination    (Blank rows = not tested)  UPPER EXTREMITY  MMT:  MMT Right eval Left eval  Shoulder flexion    Shoulder extension    Shoulder abduction    Shoulder adduction    Shoulder internal rotation    Shoulder external rotation    Middle trapezius    Lower trapezius    Elbow flexion    Elbow extension    Wrist flexion    Wrist extension    Wrist ulnar deviation    Wrist radial deviation    Wrist pronation    Wrist supination    Grip strength (lbs)    (Blank rows = not tested)  SHOULDER SPECIAL TESTS: Impingement tests: Neer impingement test: negative SLAP lesions: Biceps load test: negative  Rotator cuff assessment: Empty can test: negative, Full can test: negative, and Infraspinatus test: negative Biceps assessment: Speed's test: negative  JOINT MOBILITY TESTING:  WNL  PALPATION:  Pt reported no tenderness over proximal biceps tendon, supraspinatus tendon on R side                                                                                                                              TREATMENT DATE:   12/13/23  AROM WFL in bil shoulders with flexion, ER. Increased end range restriction noted with 90/90 Interanl rotation R>L  Grade IV posterior and inferior capsule mobilization in R shoulder Instrument assisted soft tissue mobilization to posterior shoulder joint in L shoulder (posterior deltoid region) Passive stretch of posterior capsule bil  TherEx:  Sleeper stretch: 10 x 10" R and L Doorway/wall lat stretch: 3 x 30"  R and L Shoulder ER: red band: 3 x 10 D2 flexion: 20x R and L red band Bil shoulder extensions: green band: 3 x 10 90/90 shoulder ER: red band: 2 x 10 R and L Standing H. Abduction: red band: 2 x 10 UBE: 8' level 4, 4' fwd, 4' bwd  No charge for exericses per Medicaid guidelines.  PATIENT EDUCATION: Education details: see above Person educated: Patient Education method: Explanation Education comprehension: verbalized understanding  HOME EXERCISE PROGRAM: Access Code: [email or web address] URL:  [email or web address] Date: 11/21/2023 Prepared by: Lavone Nian  Exercises - Standing Shoulder Abduction Slides at Wall  - 2 x daily - 7 x weekly - 10-20 reps - Shoulder External Rotation and Scapular Retraction with Resistance  - 1 x daily - 7 x weekly - 2 sets - 10 reps - Shoulder PNF D2 with Resistance  - 1 x daily - 7 x weekly - 2 sets - 10 reps  Access Code: [facility] - to be performed 4x/week- verbally discussed with patient.  URL: [email or web address] Date: 12/13/2023 Prepared by: Lavone Nian  Exercises - Sleeper Stretch  - 1 x daily - 7 x weekly - 1 sets - 10 reps - 10 sec hold - Latissimus Dorsi Stretch at Wall  - 1 x daily - 7 x weekly - 3 reps - 30 sec hold - Shoulder External Rotation and Scapular Retraction with Resistance  - 1 x daily - 7 x weekly - 2 sets - 10 reps - red band - Shoulder PNF D2 with Resistance  - 1 x daily - 7 x weekly - 2 sets - 10 reps - red band - Shoulder extension with resistance - Neutral  - 1 x daily - 7 x weekly - 2 sets - 10 reps - green band - Standing Shoulder Horizontal Abduction with Resistance  - 1 x daily - 7 x weekly - 2 sets - 10 reps- red band - Standing Single Arm Shoulder External Rotation in Abduction with Anchored Resistance  - 1 x daily - 7 x weekly - 2 sets - 10 reps  ASSESSMENT:  CLINICAL IMPRESSION: Progressed flexibility and strengthening and patient tolerated all exercises without pain. Short term goal met. Progressing towards pain long term goal #2.   OBJECTIVE IMPAIRMENTS: decreased ROM, decreased strength, hypomobility, increased muscle spasms, impaired flexibility, impaired UE functional use, postural dysfunction, and pain.   ACTIVITY LIMITATIONS: carrying, lifting, and reach over head  PARTICIPATION LIMITATIONS: meal prep, cleaning, and driving  PERSONAL FACTORS: Time since onset of injury/illness/exacerbation are also affecting patient's functional outcome.   REHAB POTENTIAL:  Good  CLINICAL DECISION MAKING: Stable/uncomplicated  EVALUATION COMPLEXITY: Low  GOALS: Goals reviewed with patient? Yes  SHORT TERM GOALS= 12/19/2023   Pt will be I and compliant with HEP 50% of the time to self manage symptoms Baseline: issued at Eval; >50% pain Goal status: MET  LONG TERM GOALS: Target date: 01/16/2024    Pt will report 10% improvement on disability score of UEFS to improve overall function. Baseline: 59/80 UEFS = 26% disability(11/21/23) Goal status: INITIAL  2.  Pt will report of <4/10 pain in bil shoulders at worst to improve overall function. Baseline: 8/10 max in R shoulder; 6/10 max in L shoulder (11/21/23); 5/10 max (12/13/23) Goal status: INITIAL  3.  Pt will report 100% compliance with HEP to self manage his symptoms.  Baseline: issued at eval Goal status: INITIAL   PLAN: PT FREQUENCY: 1x/week  PT DURATION: 8 weeks  PLANNED INTERVENTIONS: 54098- PT Re-evaluation, 97110-Therapeutic  exercises, 97530- Therapeutic activity, [id number]- Neuromuscular re-education, [phone]- Self Care, 56213- Manual therapy, Patient/Family education, Dry Needling, Joint mobilization, Cryotherapy, and Moist heat  PLAN FOR NEXT SESSION: Review and progress HEP  For all possible CPT codes, reference the Planned Interventions line above.     Check all conditions that are expected to impact treatment: {Conditions expected to impact treatment:Musculoskeletal disorders   If treatment provided at initial evaluation, no treatment charged due to lack of authorization.        Ileana Ladd, PT 12/13/2023, 7:35 AM

## 2023-12-20 ENCOUNTER — Ambulatory Visit: Payer: MEDICAID | Attending: Internal Medicine

## 2023-12-20 DIAGNOSIS — M25511 Pain in right shoulder: Secondary | ICD-10-CM | POA: Diagnosis present

## 2023-12-20 DIAGNOSIS — M6281 Muscle weakness (generalized): Secondary | ICD-10-CM | POA: Insufficient documentation

## 2023-12-20 DIAGNOSIS — M25512 Pain in left shoulder: Secondary | ICD-10-CM | POA: Diagnosis present

## 2023-12-20 DIAGNOSIS — G8929 Other chronic pain: Secondary | ICD-10-CM | POA: Insufficient documentation

## 2023-12-20 NOTE — Therapy (Signed)
 OUTPATIENT PHYSICAL THERAPY UPPER EXTREMITY TREATMENT NOTE   Patient Name: Shawn Leach MRN: [id number] DOB:1981-02-14, 43 y.o., male Today's Date: 12/20/2023  END OF SESSION:  PT End of Session - 12/20/23 0837     Visit Number 4    Number of Visits 9    Date for PT Re-Evaluation 01/16/24    Authorization Type [employer]    PT Start Time [phone]    PT Stop Time 0920    PT Time Calculation (min) 45 min    Activity Tolerance Patient tolerated treatment well    Behavior During Therapy [facility] for tasks assessed/performed              Past Medical History:  Diagnosis Date   Abdominal spasms 12/05/2019   Bipolar 1 disorder (HCC)    Bradycardia    asymptomatic, normal EKG and BMET   Chronic diarrhea of unknown origin 08/25/2021   Gonococcal urethritis    hx in 5/03   High risk homosexual behavior    Hx of relationship with man 2002-2005, no known disease in the partnet   History of psychiatric disorder    unknown prior diagnosis   Schizoaffective disorder, unspecified type (HCC) 03/16/2007   Diagnosed since childhood - teenage years Has been on medications including Depakote  and Seroquel  since then  Symptoms are stable but on disability due to this mental d/o  Late 2014: Fired from [employer] due to aggressive behavior 2015 Started attending [facility] in [place]      Schizophrenia [facility])    Shoulder weakness 02/23/2018   Tympanic membrane rupture    history of   History reviewed. No pertinent surgical history. 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   Back pain 06/05/2017   Encounter for immunization 07/08/2016   Adjustment disorder with emotional disturbance 06/28/2014   Health care maintenance 01/03/2013    History of alcohol use 01/03/2013   Schizoaffective disorder, bipolar type (HCC) 03/16/2007    PCP: Anna Coots, DO  REFERRING PROVIDER: Rosan Dayton BROCKS, DO  REFERRING DIAG: [phone] (ICD-10-CM) - 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 Shoulder are feeling better. Hand dominance: Right  PERTINENT HISTORY: Possible absent seizures (based on patient history)- light sensitivity.   PAIN:  Are you having pain? Yes: NPRS scale: No pain in bil shoulders currently.Pain location: Bil shoulders Pain description: sharp, dull ache Aggravating factors: reaching [place], sleeping Relieving factors: rest  PRECAUTIONS: None    WEIGHT BEARING RESTRICTIONS: No  FALLS:  Has patient fallen in last 6 months? No  L OCCUPATION: Not working  PLOF: Independent  PATIENT GOALS: improve shoulder pain    OBJECTIVE:  Note: Objective measures were completed at Evaluation unless otherwise noted.  DIAGNOSTIC FINDINGS:  none  PATIENT SURVEYS :  UEFS 59/80  COGNITION: Overall cognitive status: Within functional limits for tasks assessed     SENSATION: WFL    UPPER EXTREMITY ROM:   Active ROM Right eval Left eval  Shoulder flexion    Shoulder extension    Shoulder abduction    Shoulder adduction    Shoulder internal rotation    Shoulder external rotation    Elbow flexion    Elbow extension    Wrist flexion    Wrist extension    Wrist ulnar deviation    Wrist radial deviation    Wrist pronation    Wrist supination    (Blank rows = not tested)  UPPER EXTREMITY MMT:  MMT  Right eval Left eval  Shoulder flexion    Shoulder extension    Shoulder abduction    Shoulder adduction    Shoulder internal rotation    Shoulder external rotation    Middle trapezius    Lower trapezius    Elbow flexion    Elbow extension    Wrist flexion    Wrist extension    Wrist ulnar deviation    Wrist radial deviation    Wrist pronation    Wrist supination    Grip strength (lbs)    (Blank rows = not tested)  SHOULDER SPECIAL TESTS: Impingement tests: Neer impingement test: negative SLAP lesions: Biceps load test: negative  Rotator cuff assessment: Empty can test: negative, Full can test: negative, and Infraspinatus test: negative Biceps assessment: Speed's test: negative  JOINT MOBILITY TESTING:  WNL  PALPATION:  Pt reported no tenderness over proximal biceps tendon, supraspinatus tendon on R side                                                                                                                              TREATMENT DATE:   12/20/23  TherEx: UBE: 8' level 5, 4' fwd, 4' bwd Sleeper stretch: 10 x 10 R and L Doorway/wall lat stretch: 3 x 30 R and L Shoulder ER: red band: 3 x 10 D2 flexion: 20x R and L red band Bil shoulder extensions: green band: 3 x 10 90/90 shoulder ER: red band: 3 x 10 R and L Standing H. Abduction: green band: 2 x 10 Thoracic  self mobilization over foam roll: 2' Foam roll: pec stretch: 3 x 30 Foam roller serratus punches: 10 lb dumbbell: 2 x 10    No charge for exericses per Medicaid guidelines.  PATIENT EDUCATION: Education details: see above Person educated: Patient Education method: Explanation Education comprehension: verbalized understanding  HOME EXERCISE PROGRAM: Access Code: [email or web address] URL: [email or web address] Date: 11/21/2023 Prepared by: Raj Blanch  Exercises - Standing Shoulder Abduction Slides at Wall  - 2 x daily - 7 x weekly - 10-20 reps - Shoulder External Rotation and  Scapular Retraction with Resistance  - 1 x daily - 7 x weekly - 2 sets - 10 reps - Shoulder PNF D2 with Resistance  - 1 x daily - 7 x weekly - 2 sets - 10 reps  Access Code: [facility] - to be performed 4x/week- verbally discussed with patient.  URL: [email or web address] Date: 12/13/2023 Prepared by: Raj Blanch  Exercises - Sleeper Stretch  - 1 x daily - 7 x weekly - 1 sets - 10 reps - 10 sec hold - Latissimus Dorsi Stretch at Wall  - 1 x daily - 7 x weekly - 3 reps - 30 sec hold - Shoulder External Rotation and Scapular Retraction with Resistance  - 1 x daily - 7 x weekly - 2 sets - 10 reps - red band - Shoulder PNF D2 with Resistance  - 1 x daily - 7 x weekly - 2 sets - 10 reps - red band - Shoulder extension with resistance - Neutral  - 1 x daily - 7 x weekly - 2 sets - 10 reps - green band - Standing Shoulder Horizontal Abduction with Resistance  - 1 x daily - 7 x weekly - 2 sets - 10 reps- red band - Standing Single Arm Shoulder External Rotation in Abduction with Anchored Resistance  - 1 x daily - 7 x weekly - 2 sets - 10 reps  ASSESSMENT:  CLINICAL IMPRESSION: Today's session focused on reviewing and progressing HEP given last session to ensure proper form and compliance with HEP. Patient is reporting gradual improvement in pain. Met long term goal # 2 for pain today.    OBJECTIVE IMPAIRMENTS: decreased ROM, decreased strength, hypomobility, increased muscle spasms, impaired flexibility, impaired UE functional use, postural dysfunction, and pain.   ACTIVITY LIMITATIONS: carrying, lifting, and reach over head  PARTICIPATION LIMITATIONS: meal prep, cleaning, and driving  PERSONAL FACTORS: Time since onset of injury/illness/exacerbation are also affecting patient's functional outcome.   REHAB POTENTIAL: Good  CLINICAL DECISION MAKING: Stable/uncomplicated  EVALUATION COMPLEXITY: Low  GOALS: Goals reviewed with patient? Yes  SHORT TERM GOALS=  12/19/2023   Pt will be I and compliant with HEP 50% of the time to self manage symptoms Baseline: issued at Eval; >50% pain Goal status: MET  LONG TERM GOALS: Target date: 01/16/2024    Pt will report 10% improvement on disability score of UEFS to improve overall function. Baseline: 59/80 UEFS = 26% disability(11/21/23) Goal status: INITIAL  2.  Pt will report of <4/10 pain in bil shoulders at worst to improve overall function. Baseline: 8/10 max in R shoulder; 6/10 max in L shoulder (11/21/23); 5/10 max (12/13/23); 2/10 at max over last 2-3 days (12/20/23) Goal status: MET  3.  Pt will report 100% compliance with HEP to self manage his symptoms.  Baseline: issued at eval; >50% of the time (12/20/23) Goal status: IN PROGRESS   PLAN: PT FREQUENCY: 1x/week  PT DURATION: 8 weeks  PLANNED INTERVENTIONS: 02835- PT Re-evaluation, 97110-Therapeutic  exercises, 97530- Therapeutic activity, [id number]- Neuromuscular re-education, [phone]- Self Care, 02859- Manual therapy, Patient/Family education, Dry Needling, Joint mobilization, Cryotherapy, and Moist heat  PLAN FOR NEXT SESSION: Review and progress HEP  For all possible CPT codes, reference the Planned Interventions line above.     Check all conditions that are expected to impact treatment: {Conditions expected to impact treatment:Musculoskeletal disorders   If treatment provided at initial evaluation, no treatment charged due to lack of authorization.        Raj LOISE Blanch, PT 12/20/2023, 8:38 AM

## 2023-12-27 ENCOUNTER — Ambulatory Visit: Payer: MEDICAID

## 2024-01-03 ENCOUNTER — Ambulatory Visit: Payer: MEDICAID

## 2024-01-04 ENCOUNTER — Encounter (HOSPITAL_COMMUNITY): Payer: Self-pay

## 2024-01-04 ENCOUNTER — Telehealth (HOSPITAL_BASED_OUTPATIENT_CLINIC_OR_DEPARTMENT_OTHER): Payer: Self-pay | Admitting: Psychiatry

## 2024-01-04 ENCOUNTER — Telehealth (HOSPITAL_COMMUNITY): Payer: Self-pay | Admitting: Psychiatry

## 2024-01-04 DIAGNOSIS — Z91199 Patient's noncompliance with other medical treatment and regimen due to unspecified reason: Secondary | ICD-10-CM

## 2024-01-04 NOTE — Progress Notes (Signed)
 No show

## 2024-01-10 ENCOUNTER — Ambulatory Visit: Payer: MEDICAID

## 2024-01-17 ENCOUNTER — Ambulatory Visit: Payer: MEDICAID | Attending: Internal Medicine

## 2024-01-17 DIAGNOSIS — M25512 Pain in left shoulder: Secondary | ICD-10-CM | POA: Diagnosis present

## 2024-01-17 DIAGNOSIS — M6281 Muscle weakness (generalized): Secondary | ICD-10-CM | POA: Insufficient documentation

## 2024-01-17 DIAGNOSIS — M25511 Pain in right shoulder: Secondary | ICD-10-CM | POA: Diagnosis present

## 2024-01-17 DIAGNOSIS — G8929 Other chronic pain: Secondary | ICD-10-CM | POA: Insufficient documentation

## 2024-01-17 NOTE — Therapy (Signed)
 OUTPATIENT PHYSICAL THERAPY UPPER EXTREMITY TREATMENT NOTE Discharge Summary  PHYSICAL THERAPY DISCHARGE SUMMARY  Visits from Start of Care: 5  Current functional level related to goals / functional outcomes: WFL   Remaining deficits: none   Education / Equipment: HEP   Patient agrees to discharge. Patient goals were met. Patient is being discharged due to maximized rehab potential.     Patient Name: Shawn Leach MRN: [id number] DOB:Nov 24, 1980, 43 y.o., male Today's Date: 01/17/2024  END OF SESSION:  PT End of Session - 01/17/24 0846     Visit Number 5    Number of Visits 9    Date for PT Re-Evaluation 01/16/24    Authorization Type [employer]    PT Start Time [phone]    PT Stop Time 0930    PT Time Calculation (min) 45 min    Activity Tolerance Patient tolerated treatment well    Behavior During Therapy [facility] for tasks assessed/performed              Past Medical History:  Diagnosis Date   Abdominal spasms 12/05/2019   Bipolar 1 disorder (HCC)    Bradycardia    asymptomatic, normal EKG and BMET   Chronic diarrhea of unknown origin 08/25/2021   Gonococcal urethritis    hx in 5/03   High risk homosexual behavior    Hx of relationship with man 2002-2005, no known disease in the partnet   History of psychiatric disorder    unknown prior diagnosis   Schizoaffective disorder, unspecified type (HCC) 03/16/2007   Diagnosed since childhood - teenage years Has been on medications including Depakote and Seroquel since then  Symptoms are stable but on disability due to this mental d/o  Late 2014: Fired from [employer] due to aggressive behavior 2015 Started attending [facility] in [place]      Schizophrenia [facility])    Shoulder weakness 02/23/2018   Tympanic membrane rupture    history of   History reviewed. No pertinent surgical history. 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   Back pain 06/05/2017   Encounter for immunization 07/08/2016   Adjustment disorder with emotional disturbance 06/28/2014   Health care maintenance 01/03/2013   History of alcohol use 01/03/2013   Schizoaffective disorder, bipolar type (HCC) 03/16/2007    PCP: Elza Rafter, DO  REFERRING PROVIDER: Gust Rung, DO  REFERRING DIAG: [phone] (ICD-10-CM) - 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 He hasn't been able to come for past 30 days due to transportation scheduling issues. Hand dominance: Right  PERTINENT HISTORY: Possible absent seizures (based on patient history)- light sensitivity.   PAIN:  Are you having pain? Yes: NPRS scale: No pain in bil shoulders currently.Pain location: Bil shoulders Pain description: sharp, dull ache Aggravating factors: reaching OH, sleeping Relieving factors: rest  PRECAUTIONS: None    WEIGHT BEARING RESTRICTIONS: No  FALLS:  Has patient fallen in last 6 months? No  L OCCUPATION: Not working  PLOF: Independent  PATIENT GOALS: improve shoulder pain    OBJECTIVE:  Note: Objective measures were completed at Evaluation unless otherwise noted.  DIAGNOSTIC FINDINGS:  none  PATIENT SURVEYS :  UEFS 59/80 (eval); 76/80 (01/17/24)  COGNITION: Overall cognitive status: Within functional limits for tasks assessed     SENSATION: WFL    UPPER  EXTREMITY ROM:   Active ROM Right eval Left eval  Shoulder flexion    Shoulder extension    Shoulder abduction    Shoulder adduction    Shoulder internal rotation    Shoulder external rotation    Elbow flexion    Elbow extension    Wrist flexion    Wrist extension    Wrist ulnar deviation    Wrist radial deviation    Wrist pronation    Wrist supination    (Blank rows = not tested)  UPPER EXTREMITY MMT:  MMT Right eval Left eval  Shoulder flexion    Shoulder extension    Shoulder abduction    Shoulder adduction    Shoulder internal rotation    Shoulder external rotation    Middle trapezius    Lower trapezius    Elbow flexion    Elbow extension    Wrist flexion    Wrist extension    Wrist ulnar deviation    Wrist radial deviation    Wrist pronation    Wrist supination    Grip strength (lbs)    (Blank rows = not tested)  SHOULDER SPECIAL TESTS: Impingement tests: Neer impingement test: negative SLAP lesions: Biceps load test: negative  Rotator cuff assessment: Empty can test: negative, Full can test: negative, and Infraspinatus test: negative Biceps assessment: Speed's test: negative  JOINT MOBILITY TESTING:  WNL  PALPATION:  Pt reported no tenderness over proximal biceps tendon, supraspinatus tendon on R side                                                                                                                              TREATMENT DATE:   12/20/23 Screened ROM and MMT: all ROM WFL without pain and 5/5 MMT grossly Palpated bil shoulder for tenderness for biceps or rotator cuff tendinosis- no tenderness reported by patient. TherEx: UBE: 4' level 7, 2' fwd, 2' bwd Reviewed following exercise as final HEP: Green and blue band given  Access Code: [email or web address] URL: [email or web address] Date: 01/17/2024  Prepared by: Lavone Nian  Exercises - Sleeper Stretch  - 1 sets - 10 reps - 10 sec hold- as needed - Latissimus Dorsi Stretch at  Wall  - 1 x daily - 7 x weekly - 3 reps - 30 sec hold- daily - Shoulder External Rotation and Scapular Retraction with Resistance  - 1 x daily  - 3 x weekly - 2 sets - 10 reps - Shoulder PNF D2 with Resistance  - 1 x daily - 3 x weekly - 2 sets - 10 reps - Shoulder extension with resistance - Neutral  - 1 x daily - 3 x weekly - 2 sets - 10 reps - Standing Shoulder Horizontal Abduction with Resistance  - 1 x daily - 3 x weekly - 2 sets - 10 reps - Standing Single Arm Shoulder External Rotation in Abduction with Anchored Resistance  - 1 x daily - 3 x weekly - 2 sets - 10 reps  We discussed taking breaks with repetitive activities to avoid severe muscle cramps and pain in shoulders     PATIENT EDUCATION: Education details: see above Person educated: Patient Education method: Explanation Education comprehension: verbalized understanding  HOME EXERCISE PROGRAM: Access Code: [email or web address] URL: [email or web address] Date: 11/21/2023 Prepared by: Lavone Nian  Exercises - Standing Shoulder Abduction Slides at Wall  - 2 x daily - 7 x weekly - 10-20 reps - Shoulder External Rotation and Scapular Retraction with Resistance  - 1 x daily - 7 x weekly - 2 sets - 10 reps - Shoulder PNF D2 with Resistance  - 1 x daily - 7 x weekly - 2 sets - 10 reps  Access Code: [facility] - to be performed 4x/week- verbally discussed with patient.  URL: [email or web address] Date: 12/13/2023 Prepared by: Lavone Nian  Exercises - Sleeper Stretch  - 1 x daily - 7 x weekly - 1 sets - 10 reps - 10 sec hold - Latissimus Dorsi Stretch at Wall  - 1 x daily - 7 x weekly - 3 reps - 30 sec hold - Shoulder External Rotation and Scapular Retraction with Resistance  - 1 x daily - 7 x weekly - 2 sets - 10 reps - red band - Shoulder PNF D2 with Resistance  - 1 x daily - 7 x weekly - 2 sets - 10 reps - red band - Shoulder extension with resistance - Neutral  - 1 x daily - 7 x weekly - 2 sets - 10  reps - green band - Standing Shoulder Horizontal Abduction with Resistance  - 1 x daily - 7 x weekly - 2 sets - 10 reps- red band - Standing Single Arm Shoulder External Rotation in Abduction with Anchored Resistance  - 1 x daily - 7 x weekly - 2 sets - 10 reps  ASSESSMENT:  CLINICAL IMPRESSION: Patient has been seen for total of 5 sessions for chronic shoulder pain. Pt currently demonstrates [facility] of bil shoulder ROM and 5/5 strength in bil shoulders/elbows grossly. Patient reports of no tenderness or pain with palpation or movement of the shoulder. Patient demonstrates improved function based on UEFS scale. Patient has reached maximum potential from skilled PT for his shoulder pain and will be discharged with independent home exercise program for self management. We reviewed his final HEP today and educated on compliance and self management.    OBJECTIVE IMPAIRMENTS: decreased ROM, decreased strength, hypomobility, increased muscle spasms, impaired flexibility, impaired UE functional use, postural dysfunction, and pain.   ACTIVITY LIMITATIONS: carrying, lifting, and reach  over head  PARTICIPATION LIMITATIONS: meal prep, cleaning, and driving  PERSONAL FACTORS: Time since onset of injury/illness/exacerbation are also affecting patient's functional outcome.   REHAB POTENTIAL: Good  CLINICAL DECISION MAKING: Stable/uncomplicated  EVALUATION COMPLEXITY: Low  GOALS: Goals reviewed with patient? Yes  SHORT TERM GOALS= 12/19/2023   Pt will be I and compliant with HEP 50% of the time to self manage symptoms Baseline: issued at Eval; >50% pain Goal status: MET  LONG TERM GOALS: Target date: 01/16/2024    Pt will report 10% improvement on disability score of UEFS to improve overall function. Baseline: 59/80 UEFS = 26% disability(11/21/23); 76/80 = 5% disability (01/17/24) Goal status: MET  2.  Pt will report of <4/10 pain in bil shoulders at worst to improve overall function. Baseline: 8/10  max in R shoulder; 6/10 max in L shoulder (11/21/23); 5/10 max (12/13/23); 2/10 at max over last 2-3 days (12/20/23) Goal status: MET  3.  Pt will report 100% compliance with HEP to self manage his symptoms.  Baseline: issued at eval; >50% of the time (12/20/23) Goal status: MET   PLAN:discharge from skilled PT       Ileana Ladd, PT 01/17/2024, 8:49 AM

## 2024-01-20 ENCOUNTER — Encounter: Payer: Self-pay | Admitting: Cardiology

## 2024-01-20 ENCOUNTER — Ambulatory Visit: Payer: MEDICAID | Attending: Cardiology | Admitting: Cardiology

## 2024-01-20 VITALS — BP 120/68 | HR 58 | Ht 65.0 in | Wt 136.2 lb

## 2024-01-20 DIAGNOSIS — R002 Palpitations: Secondary | ICD-10-CM | POA: Diagnosis not present

## 2024-01-20 DIAGNOSIS — R001 Bradycardia, unspecified: Secondary | ICD-10-CM | POA: Diagnosis not present

## 2024-01-20 NOTE — Patient Instructions (Signed)
 Medication Instructions:  Your physician recommends that you continue on your current medications as directed. Please refer to the Current Medication list given to you today.  *If you need a refill on your cardiac medications before your next appointment, please call your pharmacy*    Follow-Up: At [facility], you and your health needs are our priority.  As part of our continuing mission to provide you with exceptional heart care, we have created designated Provider Care Teams.  These Care Teams include your primary Cardiologist (physician) and Advanced Practice Providers (APPs -  Physician Assistants and Nurse Practitioners) who all work together to provide you with the care you need, when you need it.   Your next appointment:   6 month(s)  Provider:   Thomasene Ripple, DO

## 2024-01-20 NOTE — Progress Notes (Signed)
 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 male with a hx of bipolar disorder, seizure affective disorder here today for follow-up visit.  At his initial visit he was experiencing palpitation we will send the patient for echocardiogram as well as a ZIO monitor 1.  He wore the ZIO monitor showed 5 episodes of asymptomatic paroxysmal supraventricular tachycardia.  Echo was normal.  Since I saw the patient he is only having bowel 3 episodes of palpitations.  His heart rate is steady in high 50s to low 60s.  The patient denies any associated symptoms and is otherwise asymptomatic. He denies smoking and vaping, but admits to regular marijuana and alcohol use. He is unsure if these substances are associated with the tachycardic episodes.   Past Medical History:  Diagnosis Date   Abdominal spasms 12/05/2019   Bipolar 1 disorder (HCC)    Bradycardia    asymptomatic, normal EKG and BMET   Chronic diarrhea of unknown origin 08/25/2021   Gonococcal urethritis    hx in 5/03   High risk homosexual behavior    Hx of relationship with man 2002-2005, no known disease in the partnet   History of psychiatric disorder    unknown prior diagnosis   Schizoaffective disorder, unspecified type (HCC) 03/16/2007   Diagnosed since childhood - teenage years Has been on medications including Depakote and Seroquel since then  Symptoms are stable but on disability due to this mental d/o  Late 2014: Fired from [employer] due to aggressive behavior 2015 Started attending [facility] in [place]      Schizophrenia [facility])    Shoulder weakness 02/23/2018   Tympanic membrane rupture    history of    No past surgical history on file.  Current  Medications: Current Meds  Medication Sig   divalproex (DEPAKOTE ER) 500 MG 24 hr tablet Take 3 tablets by mouth every night   naproxen (NAPROSYN) 500 MG tablet Take 1 tablet (500 mg total) by mouth 2 (two) times daily with a meal.     Allergies:   Patient has no known allergies.   Social History   Socioeconomic History   Marital status: Single    Spouse name: Not on file   Number of children: Not on file   Years of education: Not on file   Highest education level: Not on file  Occupational History   Not on file  Tobacco Use   Smoking status: Former    Types: Cigars    Quit date: 01/14/2011    Years since quitting: 13.0   Smokeless tobacco: Never  Vaping Use   Vaping status: Never Used  Substance and Sexual Activity   Alcohol use: Yes    Comment: occ   Drug use: Yes    Frequency: 2.0 times per week    Types: Marijuana   Sexual activity: Yes    Partners: Female    Comment: disabled on SSI, single, has a girlfriend (now only heterosexual) and 2 daughters,   Other Topics Concern   Not on file  Social History Narrative      Are you right handed or left handed? Right handed  Are you currently employed ? 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 no   Who lives with you? Lives with aunts    What type of home do you live in: 1 story or 2 story? Has steps        Social Drivers of Health   Financial Resource Strain: Medium Risk (10/04/2023)   Overall Financial Resource Strain (CARDIA)    Difficulty of Paying Living Expenses: Somewhat hard  Food Insecurity: No Food Insecurity (10/04/2023)   Hunger Vital Sign    Worried About Running Out of Food in the Last Year: Never true    Ran Out of Food in the Last Year: Never true  Transportation Needs: No Transportation Needs (10/04/2023)   PRAPARE - Administrator, Civil Service (Medical): No    Lack of Transportation (Non-Medical): No  Physical Activity: Sufficiently Active  (10/04/2023)   Exercise Vital Sign    Days of Exercise per Week: 3 days    Minutes of Exercise per Session: 90 min  Stress: Stress Concern Present (10/04/2023)   [employer] of Occupational Health - Occupational Stress Questionnaire    Feeling of Stress : To some extent  Social Connections: Moderately Isolated (10/04/2023)   Social Connection and Isolation Panel [NHANES]    Frequency of Communication with Friends and Family: Once a week    Frequency of Social Gatherings with Friends and Family: Twice a week    Attends Religious Services: More than 4 times per year    Active Member of [employer] or Organizations: No    Attends Banker Meetings: Never    Marital Status: Never married     Family History: The patient's family history is not on file.  ROS:   Review of Systems  Constitution: Negative for decreased appetite, fever and weight gain.  HENT: Negative for congestion, ear discharge, hoarse voice and sore throat.   Eyes: Negative for discharge, redness, vision loss in right eye and visual halos.  Cardiovascular: Negative for chest pain, dyspnea on exertion, leg swelling, orthopnea and palpitations.  Respiratory: Negative for cough, hemoptysis, shortness of breath and snoring.   Endocrine: Negative for heat intolerance and polyphagia.  Hematologic/Lymphatic: Negative for bleeding problem. Does not bruise/bleed easily.  Skin: Negative for flushing, nail changes, rash and suspicious lesions.  Musculoskeletal: Negative for arthritis, joint pain, muscle cramps, myalgias, neck pain and stiffness.  Gastrointestinal: Negative for abdominal pain, bowel incontinence, diarrhea and excessive appetite.  Genitourinary: Negative for decreased libido, genital sores and incomplete emptying.  Neurological: Negative for brief paralysis, focal weakness, headaches and loss of balance.  Psychiatric/Behavioral: Negative for altered mental status, depression and suicidal ideas.   Allergic/Immunologic: Negative for HIV exposure and persistent infections.    EKGs/Labs/Other Studies Reviewed:    The following studies were reviewed today:   EKG:  The ekg ordered today demonstrates   Recent Labs: 05/17/2023: BUN 11; Creatinine, Ser 0.96; Hemoglobin 13.7; Platelets 172; Potassium 4.2; Sodium 142; TSH 1.070  Recent Lipid Panel    Component Value Date/Time   CHOL 199 02/21/2017 1601   TRIG 247 (H) 02/21/2017 1601   HDL 72 02/21/2017 1601   CHOLHDL 2.8 02/21/2017 1601   LDLCALC 78 02/21/2017 1601    Physical Exam:    VS:  BP 120/68 (BP Location: Right Arm, Patient Position: Sitting, Cuff Size: Normal)   Pulse (!) 58   Ht 5\' 5"  (1.651 m)   Wt 136  lb 3.2 oz (61.8 kg)   SpO2 (!) 4%   BMI 22.66 kg/m     Wt Readings from Last 3 Encounters:  01/20/24 136 lb 3.2 oz (61.8 kg)  11/08/23 134 lb 3.2 oz (60.9 kg)  10/04/23 133 lb (60.3 kg)     GEN: Well nourished, well developed in no acute distress HEENT: Normal NECK: No JVD; No carotid bruits LYMPHATICS: No lymphadenopathy CARDIAC: S1S2 noted,RRR, no murmurs, rubs, gallops RESPIRATORY:  Clear to auscultation without rales, wheezing or rhonchi  ABDOMEN: Soft, non-tender, non-distended, +bowel sounds, no guarding. EXTREMITIES: No edema, No cyanosis, no clubbing MUSCULOSKELETAL:  No deformity  SKIN: Warm and dry NEUROLOGIC:  Alert and oriented x 3, non-focal PSYCHIATRIC:  Normal affect, good insight  ASSESSMENT:    1. Palpitations   2. Sinus bradycardia    PLAN:     Intermittent Tachycardia Episodes of tachycardia with heart rates in the 160s, baseline in the 50s. Normal echocardiogram. Medication risks bradycardia. - Instructed him to track and record potential triggers, including food and drink intake, prior to episodes. - Scheduled follow-up in six months to review data and assess triggers.  The patient is in agreement with the above plan. The patient left the office in stable condition.  The  patient will follow up in   Medication Adjustments/Labs and Tests Ordered: Current medicines are reviewed at length with the patient today.  Concerns regarding medicines are outlined above.  Orders Placed This Encounter  Procedures   EKG 12-Lead   No orders of the defined types were placed in this encounter.   Patient Instructions  Medication Instructions:  Your physician recommends that you continue on your current medications as directed. Please refer to the Current Medication list given to you today.  *If you need a refill on your cardiac medications before your next appointment, please call your pharmacy*   Follow-Up: At [facility], you and your health needs are our priority.  As part of our continuing mission to provide you with exceptional heart care, we have created designated Provider Care Teams.  These Care Teams include your primary Cardiologist (physician) and Advanced Practice Providers (APPs -  Physician Assistants and Nurse Practitioners) who all work together to provide you with the care you need, when you need it.   Your next appointment:   6 month(s)  Provider:   Thomasene Ripple, DO      Adopting a Healthy Lifestyle.  Know what a healthy weight is for you (roughly BMI <25) and aim to maintain this   Aim for 7+ servings of fruits and vegetables daily   65-80+ fluid ounces of water or unsweet tea for healthy kidneys   Limit to max 1 drink of alcohol per day; avoid smoking/tobacco   Limit animal fats in diet for cholesterol and heart health - choose grass fed whenever available   Avoid highly processed foods, and foods high in saturated/trans fats   Aim for low stress - take time to unwind and care for your mental health   Aim for 150 min of moderate intensity exercise weekly for heart health, and weights twice weekly for bone health   Aim for 7-9 hours of sleep daily   When it comes to diets, agreement about the perfect plan isnt easy to find,  even among the experts. Experts at the [facility] [employer] developed an idea known as the Healthy Eating Plate. Just imagine a plate divided into logical, healthy portions.   The emphasis is  on diet quality:   Load up on vegetables and fruits - one-half of your plate: Aim for color and variety, and remember that potatoes dont count.   Go for whole grains - one-quarter of your plate: Whole wheat, barley, wheat berries, quinoa, oats, brown rice, and foods made with them. If you want pasta, go with whole wheat pasta.   Protein power - one-quarter of your plate: Fish, chicken, beans, and nuts are all healthy, versatile protein sources. Limit red meat.   The diet, however, does go beyond the plate, offering a few other suggestions.   Use healthy plant oils, such as olive, canola, soy, corn, sunflower and peanut. Check the labels, and avoid partially hydrogenated oil, which have unhealthy trans fats.   If youre thirsty, drink water. Coffee and tea are good in moderation, but skip sugary drinks and limit milk and dairy products to one or two daily servings.   The type of carbohydrate in the diet is more important than the amount. Some sources of carbohydrates, such as vegetables, fruits, whole grains, and beans-are healthier than others.   Finally, stay active  Signed, Thomasene Ripple, DO  01/20/2024 9:05 PM    [facility]

## 2024-02-03 ENCOUNTER — Ambulatory Visit: Payer: Self-pay

## 2024-02-03 NOTE — Telephone Encounter (Signed)
  Chief Complaint: knee pain Symptoms: pain Frequency: 4 days Pertinent Negatives: Patient denies redness, swelling, CP, SOB, fever Disposition: [] ED /[] Urgent Care (no appt availability in office) / [x] Appointment(In office/virtual)/ []  [place] [facility]/ [] Home Care/ [] Refused Recommended Disposition /[] [facility] [facility]/ []  Follow-up with PCP Additional Notes: Patient calls reporting R knee pain x4 days. States that pain is constant but worse with rest. Per protocol, patient to be evaluated within 3 days. First available appointment with PCP outside of guideline. Patient scheduled with first available provider in clinic for 02/06/24 at 0845. Care advice reviewed, patient verbalized understandingand denies further questions at this time. Alerting PCP for review.    Copied from CRM [id number]. Topic: Clinical - Red Word Triage >> Feb 03, 2024  2:50 PM Hamdi H wrote: Red Word that prompted transfer to Nurse Triage: Throbbing pain in his right kneecap. This pain started 2-3 days ago and doesn't feel better with pain medicine. Reason for Disposition  [1] MODERATE pain (e.g., interferes with normal activities, limping) AND [2] present > 3 days  Answer Assessment - Initial Assessment Questions 1. LOCATION and RADIATION: "Where is the pain located?"      Right knee, inside of knee  2. QUALITY: "What does the pain feel like?"  (e.g., sharp, dull, aching, burning)     Pushing down on leg, throbbing 3. SEVERITY: "How bad is the pain?" "What does it keep you from doing?"   (Scale 1-10; or mild, moderate, severe)   -  MILD (1-3): doesn't interfere with normal activities    -  MODERATE (4-7): interferes with normal activities (e.g., work or school) or awakens from sleep, limping    -  SEVERE (8-10): excruciating pain, unable to do any normal activities, unable to walk     7/10, increases at night- walking around right now 4. ONSET: "When did the pain start?" "Does it come and go, or is it there  all the time?"     4 days, constant 5. RECURRENT: "Have you had this pain before?" If Yes, ask: "When, and what happened then?"     Denies- reports he "walked my leg out" 6. SETTING: "Has there been any recent work, exercise or other activity that involved that part of the body?"      Denies new 7. AGGRAVATING FACTORS: "What makes the knee pain worse?" (e.g., walking, climbing stairs, running)     Rest makes it worse 8. ASSOCIATED SYMPTOMS: "Is there any swelling or redness of the knee?"     Cannot tell 9. OTHER SYMPTOMS: "Do you have any other symptoms?" (e.g., chest pain, difficulty breathing, fever, calf pain)     Denies  Protocols used: Knee Pain-A-AH

## 2024-02-06 ENCOUNTER — Ambulatory Visit: Payer: MEDICAID | Admitting: Internal Medicine

## 2024-02-06 VITALS — BP 109/72 | HR 99 | Ht 65.0 in | Wt 135.2 lb

## 2024-02-06 DIAGNOSIS — M7051 Other bursitis of knee, right knee: Secondary | ICD-10-CM

## 2024-02-06 DIAGNOSIS — M7050 Other bursitis of knee, unspecified knee: Secondary | ICD-10-CM

## 2024-02-06 DIAGNOSIS — M25561 Pain in right knee: Secondary | ICD-10-CM

## 2024-02-06 NOTE — Progress Notes (Signed)
 Subjective:  CC: right knee pain  HPI:  [name] is a 43 y.o. male with a past medical history of schizoaffective disorder, prediabetes, who presents today for knee pain.  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  The pain is intermittent to the medial part of his right knee.  The pain feels sharp and radiates from the inside of his knee up into his leg.  Pain improves with naproxen and rest.  Please see problem based assessment and plan for additional details.  Past Medical History:  Diagnosis Date   Abdominal spasms 12/05/2019   Bipolar 1 disorder (HCC)    Bradycardia    asymptomatic, normal EKG and BMET   Chronic diarrhea of unknown origin 08/25/2021   Gonococcal urethritis    hx in 5/03   High risk homosexual behavior    Hx of relationship with man 2002-2005, no known disease in the partnet   History of psychiatric disorder    unknown prior diagnosis   Schizoaffective disorder, unspecified type (HCC) 03/16/2007   Diagnosed since childhood - teenage years Has been on medications including Depakote and Seroquel since then  Symptoms are stable but on disability due to this mental d/o  Late 2014: Fired from [employer] due to aggressive behavior 2015 Started attending [facility] in [place]      Schizophrenia (HCC)    Shoulder weakness 02/23/2018   Tympanic membrane rupture    history of    MEDICATIONS:  Naproxen 500 mg as needed Quetiapine 50 mg daily Depakote 500 mg daily  Social History   Socioeconomic History   Marital status: Single    Spouse name: Not on file   Number of children: Not on file   Years of education: Not on file   Highest education level: Not on file  Occupational History   Not on file  Tobacco Use   Smoking status: Former    Types: Cigars    Quit date: 01/14/2011    Years since quitting: 13.0   Smokeless tobacco: Never  Vaping Use   Vaping status: Never Used  Substance and  Sexual Activity   Alcohol use: Yes    Comment: occ   Drug use: Yes    Frequency: 2.0 times per week    Types: Marijuana   Sexual activity: Yes    Partners: Female    Comment: disabled on SSI, single, has a girlfriend (now only heterosexual) and 2 daughters,   Other Topics Concern   Not on file  Social History Narrative      Are you right handed or left handed? Right handed    Are you currently employed ? 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 no   Who lives with you? Lives with aunts    What type of home do you live in: 1 story or 2 story? Has steps        Social Drivers of Health   Financial Resource Strain: Medium Risk (10/04/2023)   Overall Financial Resource Strain (CARDIA)    Difficulty of Paying Living Expenses: Somewhat hard  Food Insecurity: No Food Insecurity (02/06/2024)   Hunger Vital Sign    Worried About Running Out of Food in the Last Year: Never true    Ran Out of Food in the Last Year: Never true  Transportation Needs: No Transportation Needs (02/06/2024)   PRAPARE -  Administrator, Civil Service (Medical): No    Lack of Transportation (Non-Medical): No  Physical Activity: Sufficiently Active (10/04/2023)   Exercise Vital Sign    Days of Exercise per Week: 3 days    Minutes of Exercise per Session: 90 min  Stress: Stress Concern Present (10/04/2023)   [employer] of Occupational Health - Occupational Stress Questionnaire    Feeling of Stress : To some extent  Social Connections: Moderately Isolated (10/04/2023)   Social Connection and Isolation Panel [NHANES]    Frequency of Communication with Friends and Family: Once a week    Frequency of Social Gatherings with Friends and Family: Twice a week    Attends Religious Services: More than 4 times per year    Active Member of [employer] or Organizations: No    Attends Banker Meetings: Never    Marital Status: Never married  Intimate Partner  Violence: Not At Risk (02/06/2024)   Humiliation, Afraid, Rape, and Kick questionnaire    Fear of Current or Ex-Partner: No    Emotionally Abused: No    Physically Abused: No    Sexually Abused: No    Review of Systems: ROS negative except for what is noted on the assessment and plan.  Objective:   Vitals:   02/06/24 0833  BP: 109/72  Pulse: 99  SpO2: 99%  Weight: 135 lb 3.2 oz (61.3 kg)  Height: 5\' 5"  (1.651 m)    Physical Exam: Constitutional: well-appearing Cardiovascular: regular rate and rhythm, no m/r/g Pulmonary/Chest: normal work of breathing on room air MSK:  - Inspection: no edema erythema to right knee compared to left - Palpation: no TTP at joint line or at pes anserine bilaterally - ROM: full active ROM in flexion and extension bilaterally - Special Tests: no laxity in ACL, PCL of right knee compared to left   Assessment & Plan:  Right knee pain Patient present with several weeks of right knee pain.  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  He describes pain as sharp pain in the medial part of his right knee that radiates into his right upper leg muscles.  He denies pain in his hip or his ankle.  He has not had a specific injury that triggered the pain.  He has not had any swelling or bruising to his knee.  At the food truck, he is mostly standing on his feet for 8 to 10 hours. He uses naproxen 500 mg nightly when pain flares up.  He has not tried ice or heat to area.  At times, pain is so extreme he feels like his leg could give out. A: On physical exam there is no signs of edema or ecchymosis.  He does not have pain today but is able to point to medial part of knee around Pes anserine bursa when pain flares up.  There is no restriction in his range of motion and ligamentous testing  shows no laxity. 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 I recommend that he continue with as needed naproxen, we reviewed dosing and he is taking 500 mg every few days with relief. I also recommended that he try ice. Referral to PT for strength training for quadriceps  Patient discussed with Dr. Marjorie Smolder Mattheus Rauls, D.O. [facility]  PGY-3 Pager: [phone]  Phone: [phone] Date 02/06/2024  Time 1:14 PM

## 2024-02-06 NOTE — Patient Instructions (Addendum)
 Thank you, Mr.Zaquan H Milliner for allowing [place] to provide your care today. Today we discussed knee pain.    Pes anserine Bursitis I think you have inflammation of the inside of you knee that happens with overuse. The key to decreasing your pain is to strengthen your leg muscles. I have printed a few exercises and stretches for you to do until you are able to get in to see physical therapy. Continue taking 1 tablet of naproxen on days your pain is bad. 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  I have ordered the following medication/changed the following medications:   Stop the following medications: There are no discontinued medications.   Start the following medications: No orders of the defined types were placed in this encounter.    Follow up: if pain does not improve with PT then follow-up in 4 weeks. Otherwise can follow-up in 6 months.   We look forward to seeing you next time. Please call our clinic at [phone] if you have any questions or concerns. The best time to call is Monday-Friday from 9am-4pm, but there is someone available 24/7. If after hours or the weekend, call the main hospital number and ask for the Internal Medicine Resident On-Call. If you need medication refills, please notify your pharmacy one week in advance and they will send [place] a request.   Thank you for trusting me with your care. Wishing you the best!   Rudene Christians, DO [facility]

## 2024-02-06 NOTE — Assessment & Plan Note (Signed)
 Patient present with several weeks of right knee pain.  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  He describes pain as sharp pain in the medial part of his right knee that radiates into his right upper leg muscles.  He denies pain in his hip or his ankle.  He has not had a specific injury that triggered the pain.  He has not had any swelling or bruising to his knee.  At the food truck, he is mostly standing on his feet for 8 to 10 hours. He uses naproxen 500 mg nightly when pain flares up.  He has not tried ice or heat to area.  At times, pain is so extreme he feels like his leg could give out. A: On physical exam there is no signs of edema or ecchymosis.  He does not have pain today but is able to point to medial part of knee around Pes anserine bursa when pain flares up.  There is no restriction in his range of motion and ligamentous testing  shows no laxity. 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 I recommend that he continue with as needed naproxen, we reviewed dosing and he is taking 500 mg every few days with relief. I also recommended that he try ice. Referral to PT for strength training for quadriceps

## 2024-02-08 NOTE — Progress Notes (Signed)
 Internal Medicine Clinic Attending  Case discussed with the resident at the time of the visit.  We reviewed the resident's history and exam and pertinent patient test results.  I agree with the assessment, diagnosis, and plan of care documented in the resident's note.

## 2024-02-14 ENCOUNTER — Telehealth (HOSPITAL_BASED_OUTPATIENT_CLINIC_OR_DEPARTMENT_OTHER): Payer: MEDICAID | Admitting: Psychiatry

## 2024-02-14 ENCOUNTER — Encounter (HOSPITAL_COMMUNITY): Payer: Self-pay | Admitting: Psychiatry

## 2024-02-14 VITALS — Wt 135.0 lb

## 2024-02-14 DIAGNOSIS — F1210 Cannabis abuse, uncomplicated: Secondary | ICD-10-CM | POA: Diagnosis not present

## 2024-02-14 DIAGNOSIS — F250 Schizoaffective disorder, bipolar type: Secondary | ICD-10-CM

## 2024-02-14 MED ORDER — QUETIAPINE FUMARATE 50 MG PO TABS: 50.0000 mg | ORAL_TABLET | Freq: Every day | ORAL | 0 refills | Status: DC

## 2024-02-14 MED ORDER — DIVALPROEX SODIUM ER 500 MG PO TB24: ORAL_TABLET | ORAL | 0 refills | Status: DC

## 2024-02-14 NOTE — Progress Notes (Signed)
  Health MD Virtual Progress Note   Patient Location: Home Provider Location: Home Office  I connect with patient by video and verified that I am speaking with correct person by using two identifiers. I discussed the limitations of evaluation and management by telemedicine and the availability of in person appointments. I also discussed with the patient that there may be a patient responsible charge related to this service. The patient expressed understanding and agreed to proceed.  Shawn Leach [id number] 43 y.o.  02/14/2024 8:50 AM  History of Present Illness:  Patient is evaluated by video session.  He like new medication and taking every night which is helping his sleep.  He reported his mood is also better and does not get upset or irritable.  However he continues to smoke marijuana at least twice a day.  Denies any tremors, shakes or any EPS.  He also taking Depakote 1500 mg but his last Depakote level was 12.5.  There is a question about his compliance.  He denies any paranoia, hallucination.  Denies any suicidal thoughts.  His appetite is okay and his weight is stable.  He is seeing therapist every 2 weeks.  He apologized missing his last appointment but like to continue future appointments.  He also like to keep the Depakote and we will do the blood work next time to check his level.  Last level was low but questionable compliance.  His appetite is okay.  His weight is stable.  Past Psychiatric History: H/O psychiatric illness since young age.  H/O suicidal thoughts, severe anger, mania and hallucination. Saw psychiatrist at the local mental health agency which closed. H/O one brief stay in the emergency room when intoxicated with alcohol. Took Seroquel (groggy and tired).  No h/o suicidal attempt, nightmares flashback or OCD symptoms.     Outpatient Encounter Medications as of 02/14/2024  Medication Sig   divalproex (DEPAKOTE ER) 500 MG 24 hr tablet Take 3 tablets  by mouth every night   naproxen (NAPROSYN) 500 MG tablet Take 1 tablet (500 mg total) by mouth 2 (two) times daily with a meal.   QUEtiapine (SEROQUEL) 50 MG tablet Take 1 tablet (50 mg total) by mouth at bedtime.   No facility-administered encounter medications on file as of 02/14/2024.    No results found for this or any previous visit (from the past 2160 hours).   Psychiatric Specialty Exam: Physical Exam  Review of Systems  Weight 135 lb (61.2 kg).There is no height or weight on file to calculate BMI.  General Appearance: Casual  Eye Contact:  Fair  Speech:   fast  Volume:  Normal  Mood:   I am fine  Affect:  Labile  Thought Process:  Descriptions of Associations: Intact  Orientation:  Full (Time, Place, and Person)  Thought Content:  WDL  Suicidal Thoughts:  No  Homicidal Thoughts:  No  Memory:  Immediate;   Fair Recent;   Fair Remote;   Fair  Judgement:  Fair  Insight:  Shallow  Psychomotor Activity:  Increased  Concentration:  Concentration: Fair and Attention Span: Fair  Recall:  [employer] of Knowledge:  Fair  Language:  Fair  Akathisia:  No  Handed:  Right  AIMS (if indicated):     Assets:  Communication Skills Desire for Improvement Housing Transportation  ADL's:  Intact  Cognition:  WNL  Sleep:  good     Assessment/Plan: Schizoaffective disorder, bipolar type (HCC) - Plan: QUEtiapine (SEROQUEL) 50 MG tablet,  divalproex (DEPAKOTE ER) 500 MG 24 hr tablet  Tetrahydrocannabinol (THC) use disorder, mild, abuse - Plan: divalproex (DEPAKOTE ER) 500 MG 24 hr tablet  Patient doing better with Seroquel and he is sleeping good.  He denies any irritability.  He like to keep the Depakote and Seroquel for now together.  We will do Depakote level on his next appointment and if needed may be optimized Seroquel and discontinue Depakote.  For now continue Depakote 1500 mg at bedtime and Seroquel 50 mg at bedtime.  Counseling given about stopping his cannabis use.  Patient  told he is working on it and he had cut down but is still smoking at least twice a day.  Encouraged to continue therapy every 2 weeks.  Recommend to call us back if is any question or any concern.  Patient has appointment coming up to see cardiology to rule out SVT.  Encouraged to keep that appointment.  Follow-up in 3 months.   Follow Up Instructions:     I discussed the assessment and treatment plan with the patient. The patient was provided an opportunity to ask questions and all were answered. The patient agreed with the plan and demonstrated an understanding of the instructions.   The patient was advised to call back or seek an in-person evaluation if the symptoms worsen or if the condition fails to improve as anticipated.    Collaboration of Care: Other provider involved in patient's care AEB notes are available in epic to review  Patient/Guardian was advised Release of Information must be obtained prior to any record release in order to collaborate their care with an outside provider. Patient/Guardian was advised if they have not already done so to contact the registration department to sign all necessary forms in order for [place] to release information regarding their care.   Consent: Patient/Guardian gives verbal consent for treatment and assignment of benefits for services provided during this visit. Patient/Guardian expressed understanding and agreed to proceed.     I provided 18 minutes of non face to face time during this encounter.  Note: This document was prepared by [employer] voice dictation technology and any errors that results from this process are unintentional.    Cleotis Nipper, MD 02/14/2024

## 2024-02-28 ENCOUNTER — Other Ambulatory Visit: Payer: Self-pay

## 2024-02-28 ENCOUNTER — Ambulatory Visit: Payer: MEDICAID | Attending: Internal Medicine | Admitting: Physical Therapy

## 2024-02-28 ENCOUNTER — Encounter: Payer: Self-pay | Admitting: Physical Therapy

## 2024-02-28 DIAGNOSIS — M7099 Unspecified soft tissue disorder related to use, overuse and pressure multiple sites: Secondary | ICD-10-CM | POA: Insufficient documentation

## 2024-02-28 DIAGNOSIS — M5459 Other low back pain: Secondary | ICD-10-CM | POA: Insufficient documentation

## 2024-02-28 DIAGNOSIS — M6281 Muscle weakness (generalized): Secondary | ICD-10-CM | POA: Diagnosis present

## 2024-02-28 DIAGNOSIS — M7050 Other bursitis of knee, unspecified knee: Secondary | ICD-10-CM | POA: Insufficient documentation

## 2024-02-28 NOTE — Therapy (Addendum)
 OUTPATIENT PHYSICAL THERAPY LOWER EXTREMITY EVALUATION   Patient Name: Shawn Leach MRN: [id number] DOB:May 15, 1981, 43 y.o., male Today's Date: 02/28/2024  END OF SESSION:  PT End of Session - 02/28/24 0942     Visit Number 1    Number of Visits 7    Date for PT Re-Evaluation 04/10/24    PT Start Time 0910    PT Stop Time 0948    PT Time Calculation (min) 38 min    Activity Tolerance Patient tolerated treatment well    Behavior During Therapy [facility] for tasks assessed/performed             Past Medical History:  Diagnosis Date   Abdominal spasms 12/05/2019   Bipolar 1 disorder (HCC)    Bradycardia    asymptomatic, normal EKG and BMET   Chronic diarrhea of unknown origin 08/25/2021   Gonococcal urethritis    hx in 5/03   High risk homosexual behavior    Hx of relationship with man 2002-2005, no known disease in the partnet   History of psychiatric disorder    unknown prior diagnosis   Schizoaffective disorder, unspecified type (HCC) 03/16/2007   Diagnosed since childhood - teenage years Has been on medications including Depakote and Seroquel since then  Symptoms are stable but on disability due to this mental d/o  Late 2014: Fired from [employer] due to aggressive behavior 2015 Started attending [facility] in [place]      Schizophrenia [facility])    Shoulder weakness 02/23/2018   Tympanic membrane rupture    history of   History reviewed. No pertinent surgical history. 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   Back pain 06/05/2017   Encounter for immunization 07/08/2016   Adjustment disorder with emotional disturbance 06/28/2014   Health care maintenance 01/03/2013   History  of alcohol use 01/03/2013   Schizoaffective disorder, bipolar type (HCC) 03/16/2007    PCP: Vicente Graham, DO  REFERRING PROVIDER: Sherol Dixie, MD  REFERRING DIAG:  Pes anserine bursitis [M70.50]   Rationale for Evaluation and Treatment: Rehabilitation  THERAPY DIAG:  Other low back pain  Unspecified soft tissue disorder related to use, overuse and pressure multiple sites  Muscle weakness (generalized)  PERTINENT HISTORY: Schizoaffective disorder bipolar type, prediabetes,  WEIGHT BEARING RESTRICTIONS: No  FALLS:  Has patient fallen in last 6 months? 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 Feels like a 3/10 currently. Hurts most, when bending forward for a long time, can stand all day as long as he gets intermittent rest. Has intermittent N/T going down R leg.  RED FLAGS: None   PLOF: Independent  PATIENT GOALS: make the back feel better.  NEXT MD VISIT: need to schedule ---------------------------------------------------------------------------------------------  OBJECTIVE:  Note: Objective measures were completed at Evaluation unless otherwise  noted.  DIAGNOSTIC FINDINGS: no recent imaging  PATIENT SURVEYS:  LEFS 76/80  COGNITION: Overall cognitive status: Within functional limits for tasks assessed     SENSATION: WFL  EDEMA:  No notable edema  MUSCLE LENGTH: Hamstrings:B imited Thomas test: B limited in rec  fem  POSTURE: increased lumbar lordosis  PALPATION: Tenderness to B piriformis   LOWER EXTREMITY ROM:  Passive ROM Right eval Left eval  Hip flexion [facility]  Hip extension Limited  limited  Hip abduction    Hip adduction    Hip internal rotation limited limited  Hip external rotation [facility]! [facility]  Knee flexion    Knee extension    Ankle dorsiflexion    Ankle plantarflexion    Ankle inversion    Ankle eversion     (Blank rows = not tested)  ! Indicates pain with testing  LOWER EXTREMITY MMT:  MMT Right eval Left eval  Hip flexion 4- 4-  Hip extension    Hip abduction    Hip adduction    Hip internal rotation    Hip external rotation    Knee flexion 4+ 4  Knee extension 4- 4  Ankle dorsiflexion    Ankle plantarflexion    Ankle inversion    Ankle eversion     (Blank rows = not tested)  ! Indicates pain with testing  LOWER EXTREMITY SPECIAL TESTS:  Hip special tests: Portia Brittle (FABER) test: positive   90/90 hold: 15s, notable shaking  GAIT: Distance walked: 122ft Assistive device utilized: None Level of assistance: Complete Independence Comments: gait [employer]                                                                                                                                [facility] Adult PT Treatment:                                                DATE: 02/28/2024  Self Care: Pt education, detailed below POC discussion    PATIENT EDUCATION:  Education details: Pt received education regarding HEP performance, ADL performance, functional activity tolerance, impairment education, appropriate performance of therapeutic activities. Person educated: Patient Education method: Explanation, Demonstration, Tactile cues, Verbal cues, and Handouts Education comprehension: verbalized understanding and returned demonstration  HOME EXERCISE PROGRAM: Access Code: [facility] URL: [email or web address] Date: 02/28/2024 Prepared by: Albesa Huguenin  Exercises - Supine Lower Trunk Rotation  - 1 x daily - 5 x weekly - 2 sets - 12 reps - 2s hold - Supine Piriformis Stretch with Foot on Ground  - 1 x daily - 5 x weekly - 2 sets - 1 reps - 108m hold - Hip Flexor Stretch at Edge of Bed  - 1 x daily - 5 x weekly - 2 sets - 1 reps - 64m hold - Supine  Bridge  - 1 x daily - 7 x weekly - 3 sets - 10 reps - 4s hold - Supine 90/90 Alternating Heel Touches with Posterior Pelvic Tilt  - 1 x daily - 5 x weekly - 3 sets - 10 reps ---------------------------------------------------------------------------------------------  ASSESSMENT:  CLINICAL IMPRESSION: Eval impression (02/28/2024): Pt. attended today's physical therapy session for evaluation of low back pain and RLE pain. Pt has complaints of occasionally R knee buckling however back feels much worse generally and wants to focus on the back pain, currently 3/10 and limits his ability to perform occupational tasks. Pt has notable deficits with global hip strength, hip flexor tightness, core activation patterns, and dynamic core stability.  Pt would benefit from therapeutic focus on posterior chain strengthening, dynamic core stability, global hip mobility/strengthening and postural education during functional motion.  Treatment performed today focused on pt education Pt demonstrated great understanding of education provided. required minimal verbal/tactile cues and no physical assistance for appropriate performance with today's activities. Pt requires the intervention of skilled outpatient physical therapy to address the aforementioned deficits and progress towards a functional level in line with therapeutic goals.   OBJECTIVE IMPAIRMENTS: decreased activity tolerance, decreased endurance, decreased mobility, decreased ROM, decreased strength, improper body mechanics, postural dysfunction, and pain.   ACTIVITY LIMITATIONS: carrying, lifting, bending, standing, locomotion level, and caring for  others  PARTICIPATION LIMITATIONS: meal prep, community activity, and occupation  PERSONAL FACTORS: Behavior pattern, Past/current experiences, Social background, and Time since onset of injury/illness/exacerbation are also affecting patient's functional outcome.   REHAB POTENTIAL: Good  CLINICAL DECISION MAKING: Stable/uncomplicated  EVALUATION COMPLEXITY: Low   GOALS: Goals reviewed with patient? YES  SHORT TERM GOALS: Target date: 03/20/2024   Pt will be independent with administered HEP to demonstrate the competency necessary for long term managemnet of symptoms at home.  Baseline: Goal status: INITIAL    LONG TERM GOALS: Target date: 04/10/2024  Pt. Will achieve a LEFS score of 80/80 as to demonstrate improvement in self-perceived functional ability with daily activities.  Baseline: 76/80 Goal status: INITIAL  2.  Pt will improve Global Hip/knee strength to a 4+/5 to demonstrate improvement in strength for quality of motion and activity performance.  Baseline: see obj chart Goal status: INITIAL  3.  Pt will report the ability to stand all day during work as to demonstrate improved tolerance to standing for prolonged time and improved ability to participate in occupational tasks.  Baseline: "sometimes can push through with 4/10 pain" Goal status: INITIAL  4.  Pt will hold a 90/90 with a posterior pelvic tilt for 45s to demonstrate improved core stability and endurance necessary for ADLs and occupational tasks. Baseline:  15s with shaking Goal status: INITIAL    --------------------------------------------------------------------------------------------- PLAN:  PT FREQUENCY: 1-2x/week  PT DURATION: 6 weeks  PLANNED INTERVENTIONS: 97110-Therapeutic exercises, 97530- Therapeutic activity, 97112- Neuromuscular re-education, 97535- Self Care, 16109- Manual therapy, [phone]- Gait training, Patient/Family education, Stair training, Taping, Dry Needling, Joint  mobilization, and Cryotherapy  PLAN FOR NEXT SESSION: Review HEP, Begin POC as detailed in the assessment   Sheliah Plane, PT, DPT 02/28/2024, 9:53 AM  For all possible CPT codes, reference the Planned Interventions line above.     Check all conditions that are expected to impact treatment: {Conditions expected to impact treatment:Neurological condition and/or seizures   If treatment provided at initial evaluation, no treatment charged due to lack of authorization.

## 2024-03-06 ENCOUNTER — Ambulatory Visit: Payer: MEDICAID

## 2024-03-06 DIAGNOSIS — M5459 Other low back pain: Secondary | ICD-10-CM | POA: Diagnosis not present

## 2024-03-06 DIAGNOSIS — M6281 Muscle weakness (generalized): Secondary | ICD-10-CM

## 2024-03-06 DIAGNOSIS — M7099 Unspecified soft tissue disorder related to use, overuse and pressure multiple sites: Secondary | ICD-10-CM

## 2024-03-06 NOTE — Therapy (Signed)
 OUTPATIENT PHYSICAL THERAPY LOWER EXTREMITY EVALUATION   Patient Name: Shawn Leach MRN: [id number] DOB:11-Nov-1981, 43 y.o., male Today's Date: 03/06/2024  END OF SESSION:  PT End of Session - 03/06/24 1743     Visit Number 2    Number of Visits 7    Date for PT Re-Evaluation 04/10/24    Authorization Type [employer]    PT Start Time 1740    Activity Tolerance Patient tolerated treatment well    Behavior During Therapy [facility] for tasks assessed/performed              Past Medical History:  Diagnosis Date   Abdominal spasms 12/05/2019   Bipolar 1 disorder (HCC)    Bradycardia    asymptomatic, normal EKG and BMET   Chronic diarrhea of unknown origin 08/25/2021   Gonococcal urethritis    hx in 5/03   High risk homosexual behavior    Hx of relationship with man 2002-2005, no known disease in the partnet   History of psychiatric disorder    unknown prior diagnosis   Schizoaffective disorder, unspecified type (HCC) 03/16/2007   Diagnosed since childhood - teenage years Has been on medications including Depakote  and Seroquel  since then  Symptoms are stable but on disability due to this mental d/o  Late 2014: Fired from [employer] due to aggressive behavior 2015 Started attending [employer] in [place]      Schizophrenia [facility])    Shoulder weakness 02/23/2018   Tympanic membrane rupture    history of   History reviewed. No pertinent surgical history. 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   Back pain 06/05/2017   Encounter for immunization 07/08/2016   Adjustment disorder with emotional disturbance 06/28/2014   Health care maintenance 01/03/2013   History of alcohol use 01/03/2013    Schizoaffective disorder, bipolar type (HCC) 03/16/2007    PCP: Vicente Graham, DO  REFERRING PROVIDER: Sherol Dixie, MD  REFERRING DIAG:  Pes anserine bursitis [M70.50]   Rationale for Evaluation and Treatment: Rehabilitation  THERAPY DIAG:  Unspecified soft tissue disorder related to use, overuse and pressure multiple sites  Muscle weakness (generalized)  PERTINENT HISTORY: Schizoaffective disorder bipolar type, prediabetes,  WEIGHT BEARING RESTRICTIONS: No  FALLS:  Has patient fallen in last 6 months? 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  Unable to relate distinct aggravating or relieving factors.  No pain reported on arrival to PT.  Eval statement 03/06/2024: pt currently in 0/10 pain in leg onset a few weeks ago, back has been hurting all the time for as long as he can remember. Feels like a 3/10 currently. Hurts most, when bending forward for a long time, can stand all day as long as he gets intermittent rest. Has intermittent N/T going down R leg.  RED FLAGS: None   PLOF: Independent  PATIENT GOALS: make the back feel better.  NEXT MD VISIT: need  to schedule ---------------------------------------------------------------------------------------------  OBJECTIVE:  Note: Objective measures were completed at Evaluation unless otherwise noted.  DIAGNOSTIC FINDINGS: no recent imaging  PATIENT SURVEYS:  LEFS 76/80  COGNITION: Overall cognitive status: Within functional limits for tasks  assessed     SENSATION: WFL  EDEMA:  No notable edema  MUSCLE LENGTH: Hamstrings:B imited Thomas test: B limited in rec fem  POSTURE: increased lumbar lordosis  PALPATION: Tenderness to B piriformis   LOWER EXTREMITY ROM:  Passive ROM Right eval Left eval  Hip flexion [facility]  Hip extension Limited  limited  Hip abduction    Hip adduction    Hip internal rotation limited limited  Hip external rotation [facility]! [facility]  Knee flexion    Knee extension    Ankle dorsiflexion    Ankle plantarflexion    Ankle inversion    Ankle eversion     (Blank rows = not tested)  ! Indicates pain with testing  LOWER EXTREMITY MMT:  MMT Right eval Left eval  Hip flexion 4- 4-  Hip extension    Hip abduction    Hip adduction    Hip internal rotation    Hip external rotation    Knee flexion 4+ 4  Knee extension 4- 4  Ankle dorsiflexion    Ankle plantarflexion    Ankle inversion    Ankle eversion     (Blank rows = not tested)  ! Indicates pain with testing  LOWER EXTREMITY SPECIAL TESTS:  Hip special tests: Portia Brittle (FABER) test: positive   90/90 hold: 15s, notable shaking  GAIT: Distance walked: 163ft Assistive device utilized: None Level of assistance: Complete Independence Comments: gait [facility]   [facility] Adult PT Treatment:                                                DATE: 03/06/24 Therapeutic Exercise: Nustep L4 8 min Seated hamstring stretch R 30s x2 R ITB stretch w/strap 30s x2 R PKB stretch 30s x2 Neuromuscular re-ed: Quad set 3s 15x R SLR 15x Stool walks 1 lap Therapeutic Activity: SAQs 5# 15x2 TKE GTB 15x Seated hamstring curls GTB 15x                                                                                                                         [facility] Adult PT Treatment:                                                DATE: 02/28/2024  Self Care: Pt education, detailed below POC discussion    PATIENT EDUCATION:  Education details: Pt received  education regarding HEP performance, ADL performance, functional activity tolerance, impairment education, appropriate performance of therapeutic activities. Person educated:  Patient Education method: Explanation, Demonstration, Tactile cues, Verbal cues, and Handouts Education comprehension: verbalized understanding and returned demonstration  HOME EXERCISE PROGRAM: Access Code: [facility] URL: [email or web address] Date: 02/28/2024 Prepared by: Albesa Huguenin  Exercises - Supine Lower Trunk Rotation  - 1 x daily - 5 x weekly - 2 sets - 12 reps - 2s hold - Supine Piriformis Stretch with Foot on Ground  - 1 x daily - 5 x weekly - 2 sets - 1 reps - 78m hold - Hip Flexor Stretch at Edge of Bed  - 1 x daily - 5 x weekly - 2 sets - 1 reps - 15m hold - Supine Bridge  - 1 x daily - 7 x weekly - 3 sets - 10 reps - 4s hold - Supine 90/90 Alternating Heel Touches with Posterior Pelvic Tilt  - 1 x daily - 5 x weekly - 3 sets - 10 reps ---------------------------------------------------------------------------------------------  ASSESSMENT:  CLINICAL IMPRESSION: Patient arrives w/o knee pain today.  Unable to describe aggravating or relieving factors.  Session consisted of aerobic w/u followed by quad strengthening emphasizing TKE and flexibility.  Some symptom reproduction initially with SAQs which improved with repetitions.  Eval impression (03/06/2024): Pt. attended today's physical therapy session for evaluation of low back pain and RLE pain. Pt has complaints of occasionally R knee buckling however back feels much worse generally and wants to focus on the back pain, currently 3/10 and limits his ability to perform occupational tasks. Pt has notable deficits with global hip strength, hip flexor tightness, core activation patterns, and dynamic core stability.  Pt would benefit from therapeutic focus on posterior chain strengthening, dynamic core stability, global hip mobility/strengthening  and postural education during functional motion.  Treatment performed today focused on pt education Pt demonstrated great understanding of education provided. required minimal verbal/tactile cues and no physical assistance for appropriate performance with today's activities. Pt requires the intervention of skilled outpatient physical therapy to address the aforementioned deficits and progress towards a functional level in line with therapeutic goals.   OBJECTIVE IMPAIRMENTS: decreased activity tolerance, decreased endurance, decreased mobility, decreased ROM, decreased strength, improper body mechanics, postural dysfunction, and pain.   ACTIVITY LIMITATIONS: carrying, lifting, bending, standing, locomotion level, and caring for others  PARTICIPATION LIMITATIONS: meal prep, community activity, and occupation  PERSONAL FACTORS: Behavior pattern, Past/current experiences, Social background, and Time since onset of injury/illness/exacerbation are also affecting patient's functional outcome.   REHAB POTENTIAL: Good  CLINICAL DECISION MAKING: Stable/uncomplicated  EVALUATION COMPLEXITY: Low   GOALS: Goals reviewed with patient? YES  SHORT TERM GOALS: Target date: 03/20/2024   Pt will be independent with administered HEP to demonstrate the competency necessary for long term managemnet of symptoms at home.  Baseline: Goal status: INITIAL    LONG TERM GOALS: Target date: 04/10/2024  Pt. Will achieve a LEFS score of 80/80 as to demonstrate improvement in self-perceived functional ability with daily activities.  Baseline: 76/80 Goal status: INITIAL  2.  Pt will improve Global Hip/knee strength to a 4+/5 to demonstrate improvement in strength for quality of motion and activity performance.  Baseline: see obj chart Goal status: INITIAL  3.  Pt will report the ability to stand all day during work as to demonstrate improved tolerance to standing for prolonged time and improved ability to  participate in occupational tasks.  Baseline: "sometimes can push through with 4/10 pain" Goal status: INITIAL  4.  Pt will hold a 90/90 with a posterior pelvic tilt for 45s to demonstrate improved  core stability and endurance necessary for ADLs and occupational tasks. Baseline:  15s with shaking Goal status: INITIAL    --------------------------------------------------------------------------------------------- PLAN:  PT FREQUENCY: 1-2x/week  PT DURATION: 6 weeks  PLANNED INTERVENTIONS: 97110-Therapeutic exercises, 97530- Therapeutic activity, 97112- Neuromuscular re-education, 97535- Self Care, 82423- Manual therapy, [phone]- Gait training, Patient/Family education, Stair training, Taping, Dry Needling, Joint mobilization, and Cryotherapy  PLAN FOR NEXT SESSION: Review HEP, Begin POC as detailed in the assessment   Albesa Huguenin, PT, DPT 03/06/2024, 6:22 PM  For all possible CPT codes, reference the Planned Interventions line above.     Check all conditions that are expected to impact treatment: {Conditions expected to impact treatment:Neurological condition and/or seizures   If treatment provided at initial evaluation, no treatment charged due to lack of authorization.

## 2024-03-13 ENCOUNTER — Ambulatory Visit: Payer: MEDICAID | Admitting: Physical Therapy

## 2024-03-13 ENCOUNTER — Encounter: Payer: Self-pay | Admitting: Physical Therapy

## 2024-03-13 DIAGNOSIS — M7099 Unspecified soft tissue disorder related to use, overuse and pressure multiple sites: Secondary | ICD-10-CM

## 2024-03-13 DIAGNOSIS — M6281 Muscle weakness (generalized): Secondary | ICD-10-CM

## 2024-03-13 DIAGNOSIS — M5459 Other low back pain: Secondary | ICD-10-CM

## 2024-03-13 NOTE — Therapy (Signed)
 OUTPATIENT PHYSICAL THERAPY TREATMENT NOTE    Patient Name: Shawn Leach MRN: [id number] DOB:1981-09-03, 43 y.o., male Today's Date: 03/13/2024  END OF SESSION:  PT End of Session - 03/13/24 0826     Visit Number 3    Number of Visits 7    Date for PT Re-Evaluation 04/10/24    Authorization - Visit Number 2    Authorization - Number of Visits 12    PT Start Time 0830    PT Stop Time 0910    PT Time Calculation (min) 40 min    Activity Tolerance Patient tolerated treatment well    Behavior During Therapy [facility] for tasks assessed/performed               Past Medical History:  Diagnosis Date   Abdominal spasms 12/05/2019   Bipolar 1 disorder (HCC)    Bradycardia    asymptomatic, normal EKG and BMET   Chronic diarrhea of unknown origin 08/25/2021   Gonococcal urethritis    hx in 5/03   High risk homosexual behavior    Hx of relationship with man 2002-2005, no known disease in the partnet   History of psychiatric disorder    unknown prior diagnosis   Schizoaffective disorder, unspecified type (HCC) 03/16/2007   Diagnosed since childhood - teenage years Has been on medications including Depakote  and Seroquel  since then  Symptoms are stable but on disability due to this mental d/o  Late 2014: Fired from [employer] due to aggressive behavior 2015 Started attending [facility] in [place]      Schizophrenia [facility])    Shoulder weakness 02/23/2018   Tympanic membrane rupture    history of   History reviewed. No pertinent surgical history. 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   Back pain 06/05/2017   Encounter for immunization 07/08/2016   Adjustment disorder with emotional  disturbance 06/28/2014   Health care maintenance 01/03/2013   History of alcohol use 01/03/2013   Schizoaffective disorder, bipolar type (HCC) 03/16/2007    PCP: Vicente Graham, DO  REFERRING PROVIDER: Sherol Dixie, MD  REFERRING DIAG:  Pes anserine bursitis [M70.50]   Rationale for Evaluation and Treatment: Rehabilitation  THERAPY DIAG:  Unspecified soft tissue disorder related to use, overuse and pressure multiple sites  Muscle weakness (generalized)  Other low back pain  PERTINENT HISTORY: Schizoaffective disorder bipolar type, prediabetes,  WEIGHT BEARING RESTRICTIONS: No  FALLS:  Has patient fallen in last 6 months? 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 Pt stated that they have maintained good compliance with current HEP.  Did a lot this weekend, a lot of cooking at a birthday party.   Eval statement 02/28/2024: pt currently in 0/10 pain in leg onset a few weeks ago, back has been hurting all the time for as long as he can remember. Feels like a 3/10 currently.  Hurts most, when bending forward for a long time, can stand all day as long as he gets intermittent rest. Has intermittent N/T going down R leg.  RED FLAGS: None   PLOF: Independent  PATIENT GOALS: make the back feel better.  NEXT MD VISIT: need to schedule ---------------------------------------------------------------------------------------------  OBJECTIVE:  Note: Objective measures were completed at Evaluation unless otherwise  noted.  DIAGNOSTIC FINDINGS: no recent imaging  PATIENT SURVEYS:  LEFS 76/80  COGNITION: Overall cognitive status: Within functional limits for tasks assessed     SENSATION: WFL  EDEMA:  No notable edema  MUSCLE LENGTH: Hamstrings:B imited Thomas test: B limited in rec fem  POSTURE: increased lumbar lordosis  PALPATION: Tenderness to B piriformis   LOWER EXTREMITY ROM:  Passive ROM Right eval Left eval  Hip flexion [facility]  Hip extension Limited  limited  Hip abduction    Hip adduction    Hip internal rotation limited limited  Hip external rotation [facility]  Knee flexion    Knee extension    Ankle dorsiflexion    Ankle plantarflexion    Ankle inversion    Ankle eversion     (Blank rows = not tested)  ! Indicates pain with testing  LOWER EXTREMITY MMT:  MMT Right eval Left eval  Hip flexion 4- 4-  Hip extension    Hip abduction    Hip adduction    Hip internal rotation    Hip external rotation    Knee flexion 4+ 4  Knee extension 4- 4  Ankle dorsiflexion    Ankle plantarflexion    Ankle inversion    Ankle eversion     (Blank rows = not tested)  ! Indicates pain with testing  LOWER EXTREMITY SPECIAL TESTS:  Hip special tests: Portia Brittle (FABER) test: positive   90/90 hold: 15s, notable shaking  GAIT: Distance walked: 110ft Assistive device utilized: None Level of assistance: Complete Independence Comments: gait [facility]  [facility] Adult PT Treatment:                                                DATE: 03/13/2024  Therapeutic Exercise: Nustep  L6 8' Seated hamstring stretch  2x1' B Supine quad/hip flexor stretch w/ strap 2x1' R Therapeutic Activity: Monster walks fwd/bkwd 2x20 feet, RTB Machine quad iso 3x1' RLE, 5lbs at end range for extension endurance Machine hamstring curls  2x12, 45lbs   [facility] Adult PT Treatment:                                                DATE: 03/06/24 Therapeutic Exercise: Nustep L4 8 min Seated hamstring  stretch R 30s x2 R ITB stretch w/strap 30s x2 R PKB stretch 30s x2 Neuromuscular re-ed: Quad set 3s 15x R SLR 15x Stool walks 1 lap Therapeutic Activity: SAQs 5# 15x2 TKE GTB 15x Seated hamstring curls GTB 15x  PATIENT EDUCATION:  Education details: Pt received education regarding HEP performance, ADL performance, functional activity tolerance, impairment education, appropriate performance of therapeutic activities. Person educated: Patient Education method: Explanation, Demonstration, Tactile cues, Verbal cues, and Handouts Education comprehension: verbalized understanding and returned demonstration  HOME EXERCISE PROGRAM: Access Code: [facility] URL: [email or web address] Date: 02/28/2024 Prepared by: Albesa Huguenin  Exercises - Supine Lower Trunk Rotation  - 1 x daily - 5 x weekly - 2 sets - 12 reps - 2s hold - Supine Piriformis Stretch with Foot on Ground  - 1 x daily - 5 x weekly - 2 sets - 1 reps - 71m hold - Hip Flexor Stretch at Edge of Bed  - 1 x daily - 5 x weekly - 2 sets - 1 reps - 55m hold - Supine Bridge  - 1 x daily - 7 x weekly - 3 sets - 10 reps - 4s hold - Supine 90/90 Alternating Heel Touches with Posterior Pelvic Tilt  - 1 x daily - 5 x weekly - 3 sets - 10 reps ---------------------------------------------------------------------------------------------  ASSESSMENT:  CLINICAL IMPRESSION:  Pt attended physical therapy session for continuation of treatment regarding low back pain and knee pain. Today's treatment focused on improvement of  hip flexor motility, global hip/knee strength, and dynamic core stability, . Pt showed  great tolerance to administered treatment with no adverse effects by the end of session. Pt displayed progression with ability to cook and stand for the day this past weekend, cooking al lday for a birthday party  with only moderate "tightness" in the R thigh by the end of the day, no knee pain otherwise noted for past 2 weeks. Pt required minimal verbal/tactile cuing alongside no physical assistance for safe and appropriate performance of today's activities. Continue with therapeutic focus on posterior chain strengthening, dynamic core stability, global hip mobility/strengthening and postural education during functional motion. .    Eval impression (02/28/2024): Pt. attended today's physical therapy session for evaluation of low back pain and RLE pain. Pt has complaints of occasionally R knee buckling however back feels much worse generally and wants to focus on the back pain, currently 3/10 and limits his ability to perform occupational tasks. Pt has notable deficits with global hip strength, hip flexor tightness, core activation patterns, and dynamic core stability.  Pt would benefit from therapeutic focus on posterior chain strengthening, dynamic core stability, global hip mobility/strengthening and postural education during functional motion.  Treatment performed today focused on pt education Pt demonstrated great understanding of education provided. required minimal verbal/tactile cues and no physical assistance for appropriate performance with today's activities. Pt requires the intervention of skilled outpatient physical therapy to address the aforementioned deficits and progress towards a functional level in line with therapeutic goals.   OBJECTIVE IMPAIRMENTS: decreased activity tolerance, decreased endurance, decreased mobility, decreased ROM, decreased strength, improper body mechanics, postural dysfunction, and pain.   ACTIVITY LIMITATIONS: carrying, lifting, bending, standing, locomotion level, and caring for others  PARTICIPATION LIMITATIONS: meal prep, community activity, and occupation  PERSONAL FACTORS: Behavior pattern, Past/current experiences, Social background, and Time since onset of  injury/illness/exacerbation are also affecting patient's functional outcome.   REHAB POTENTIAL: Good  CLINICAL DECISION MAKING: Stable/uncomplicated  EVALUATION COMPLEXITY: Low   GOALS: Goals reviewed with patient? YES  SHORT TERM GOALS: Target date: 03/20/2024   Pt will be independent with administered HEP to demonstrate the competency necessary for long term managemnet of symptoms at home.  Baseline: Goal status: INITIAL    LONG TERM GOALS:  Target date: 04/10/2024  Pt. Will achieve a LEFS score of 80/80 as to demonstrate improvement in self-perceived functional ability with daily activities.  Baseline: 76/80 Goal status: INITIAL  2.  Pt will improve Global Hip/knee strength to a 4+/5 to demonstrate improvement in strength for quality of motion and activity performance.  Baseline: see obj chart Goal status: INITIAL  3.  Pt will report the ability to stand all day during work as to demonstrate improved tolerance to standing for prolonged time and improved ability to participate in occupational tasks.  Baseline: "sometimes can push through with 4/10 pain" Goal status: INITIAL  4.  Pt will hold a 90/90 with a posterior pelvic tilt for 45s to demonstrate improved core stability and endurance necessary for ADLs and occupational tasks. Baseline:  15s with shaking Goal status: INITIAL    --------------------------------------------------------------------------------------------- PLAN:  PT FREQUENCY: 1-2x/week  PT DURATION: 6 weeks  PLANNED INTERVENTIONS: 97110-Therapeutic exercises, 97530- Therapeutic activity, 97112- Neuromuscular re-education, 97535- Self Care, 16109- Manual therapy, [phone]- Gait training, Patient/Family education, Stair training, Taping, Dry Needling, Joint mobilization, and Cryotherapy  PLAN FOR NEXT SESSION:  Continue with therapeutic focus on posterior chain strengthening, dynamic core stability, global hip mobility/strengthening and postural  education during functional motion. Albesa Huguenin, PT, DPT 03/13/2024, 9:11 AM  For all possible CPT codes, reference the Planned Interventions line above.     Check all conditions that are expected to impact treatment: {Conditions expected to impact treatment:Neurological condition and/or seizures   If treatment provided at initial evaluation, no treatment charged due to lack of authorization.

## 2024-03-20 ENCOUNTER — Ambulatory Visit: Payer: MEDICAID | Attending: Internal Medicine | Admitting: Physical Therapy

## 2024-03-20 ENCOUNTER — Encounter: Payer: Self-pay | Admitting: Physical Therapy

## 2024-03-20 DIAGNOSIS — M5459 Other low back pain: Secondary | ICD-10-CM | POA: Insufficient documentation

## 2024-03-20 DIAGNOSIS — M6281 Muscle weakness (generalized): Secondary | ICD-10-CM | POA: Insufficient documentation

## 2024-03-20 DIAGNOSIS — M7099 Unspecified soft tissue disorder related to use, overuse and pressure multiple sites: Secondary | ICD-10-CM | POA: Insufficient documentation

## 2024-03-20 NOTE — Therapy (Signed)
 OUTPATIENT PHYSICAL THERAPY TREATMENT NOTE    Patient Name: Shawn Leach MRN: [id number] DOB:1981-08-10, 43 y.o., male Today's Date: 03/20/2024  END OF SESSION:  PT End of Session - 03/20/24 0832     Visit Number 4    Number of Visits 7    Date for PT Re-Evaluation 04/10/24    Authorization Type [employer]    Authorization - Visit Number 3    Authorization - Number of Visits 12    PT Start Time 0831    PT Stop Time 0911    PT Time Calculation (min) 40 min    Activity Tolerance Patient tolerated treatment well    Behavior During Therapy [facility] for tasks assessed/performed                Past Medical History:  Diagnosis Date   Abdominal spasms 12/05/2019   Bipolar 1 disorder (HCC)    Bradycardia    asymptomatic, normal EKG and BMET   Chronic diarrhea of unknown origin 08/25/2021   Gonococcal urethritis    hx in 5/03   High risk homosexual behavior    Hx of relationship with man 2002-2005, no known disease in the partnet   History of psychiatric disorder    unknown prior diagnosis   Schizoaffective disorder, unspecified type (HCC) 03/16/2007   Diagnosed since childhood - teenage years Has been on medications including Depakote  and Seroquel  since then  Symptoms are stable but on disability due to this mental d/o  Late 2014: Fired from [employer] due to aggressive behavior 2015 Started attending [facility] in [place]      Schizophrenia [facility])    Shoulder weakness 02/23/2018   Tympanic membrane rupture    history of   History reviewed. No pertinent surgical history. 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   Back pain 06/05/2017   Encounter for immunization 07/08/2016    Adjustment disorder with emotional disturbance 06/28/2014   Health care maintenance 01/03/2013   History of alcohol use 01/03/2013   Schizoaffective disorder, bipolar type (HCC) 03/16/2007    PCP: Vicente Graham, DO  REFERRING PROVIDER: Sherol Dixie, MD  REFERRING DIAG:  Pes anserine bursitis [M70.50]   Rationale for Evaluation and Treatment: Rehabilitation  THERAPY DIAG:  Unspecified soft tissue disorder related to use, overuse and pressure multiple sites  Muscle weakness (generalized)  Other low back pain  PERTINENT HISTORY: Schizoaffective disorder bipolar type, prediabetes,  WEIGHT BEARING RESTRICTIONS: No  FALLS:  Has patient fallen in last 6 months? 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  Stated that he was cutting his grass yesterday, isnt having much functional issue, just a constant nagging low level pain in the back.   Eval statement 02/28/2024: pt currently in 0/10 pain in leg onset a few weeks ago, back has been hurting all the time for  as long as he can remember. Feels like a 3/10 currently. Hurts most, when bending forward for a long time, can stand all day as long as he gets intermittent rest. Has intermittent N/T going down R leg.  RED FLAGS: None   PLOF: Independent  PATIENT GOALS: make the back feel better.  NEXT MD VISIT: need to  schedule ---------------------------------------------------------------------------------------------  OBJECTIVE:  Note: Objective measures were completed at Evaluation unless otherwise noted.  DIAGNOSTIC FINDINGS: no recent imaging  PATIENT SURVEYS:  LEFS 76/80  COGNITION: Overall cognitive status: Within functional limits for tasks assessed     SENSATION: WFL  EDEMA:  No notable edema  MUSCLE LENGTH: Hamstrings:B imited Thomas test: B limited in rec fem  POSTURE: increased lumbar lordosis  PALPATION: Tenderness to B piriformis   LOWER EXTREMITY ROM:  Passive ROM Right eval Left eval  Hip flexion [facility]  Hip extension Limited  limited  Hip abduction    Hip adduction    Hip internal rotation limited limited  Hip external rotation [facility]! [facility]  Knee flexion    Knee extension    Ankle dorsiflexion    Ankle plantarflexion    Ankle inversion    Ankle eversion     (Blank rows = not tested)  ! Indicates pain with testing  LOWER EXTREMITY MMT:  MMT Right eval Left eval  Hip flexion 4- 4-  Hip extension    Hip abduction    Hip adduction    Hip internal rotation    Hip external rotation    Knee flexion 4+ 4  Knee extension 4- 4  Ankle dorsiflexion    Ankle plantarflexion    Ankle inversion    Ankle eversion     (Blank rows = not tested)  ! Indicates pain with testing  LOWER EXTREMITY SPECIAL TESTS:  Hip special tests: Portia Brittle (FABER) test: positive   90/90 hold: 15s, notable shaking  GAIT: Distance walked: 116ft Assistive device utilized: None Level of assistance: Complete Independence Comments: gait [facility]   [facility] Adult PT Treatment:                                                DATE: 03/13/2024  Therapeutic Exercise: Nustep  L6 8' Standing quad/hip flexor stretch 2x1' R Therapeutic Activity: PPT 1x10, hold 3s Supine bridge w/PPT, GTB 2x12, hold 5s  Hip hinge RDL practice  1x12, hold 2s, no weight 90/90 hold w/PPT 3x30s  [facility] Adult  PT Treatment:                                                DATE: 03/13/2024  Therapeutic Exercise: Nustep  L6 8' Seated hamstring stretch  2x1' B Supine quad/hip flexor stretch w/ strap 2x1' R Therapeutic Activity: Monster walks fwd/bkwd 2x20 feet, RTB Machine quad iso 3x1' RLE, 5lbs at end range for extension endurance Machine hamstring curls  2x12, 45lbs  PATIENT EDUCATION:  Education details: Pt received education regarding HEP performance, ADL performance, functional activity tolerance, impairment education, appropriate performance of therapeutic activities. Person educated: Patient Education method: Explanation, Demonstration, Tactile cues, Verbal cues, and Handouts Education comprehension: verbalized understanding and returned demonstration  HOME EXERCISE PROGRAM: Access Code: [facility] URL: [email or web address] Date: 02/28/2024 Prepared by: Albesa Huguenin  Exercises - Supine Lower Trunk Rotation  - 1 x daily - 5 x weekly - 2 sets - 12 reps - 2s hold - Supine Piriformis Stretch with Foot on Ground  - 1 x daily - 5 x weekly - 2 sets - 1 reps - 85m hold - Hip Flexor Stretch at Edge of Bed  - 1 x daily - 5 x weekly - 2 sets - 1 reps - 46m hold - Supine Bridge  - 1 x daily - 7 x weekly - 3 sets - 10 reps - 4s hold - Supine 90/90 Alternating Heel Touches with Posterior Pelvic Tilt  - 1 x daily - 5 x weekly - 3 sets - 10 reps ---------------------------------------------------------------------------------------------  ASSESSMENT:  CLINICAL IMPRESSION:  Pt attended physical therapy session for continuation of treatment regarding low back pain. Today's treatment focused on improvement of  dynamic core activation, posterior chain strength/stability, and functional activity tolerance. Pt showed  great tolerance to administered treatment with no  adverse effects by the end of session. Pt required moderate verbal/tactile cuing alongside no physical assistance for safe and appropriate performance of today's activities, with focus on PPT and hip hinge technique. Continue with therapeutic focus on functional activity tolerance, dynamic core activation, and begin transitioning to a long term HEP for self-management of symptoms.   Eval impression (02/28/2024): Pt. attended today's physical therapy session for evaluation of low back pain and RLE pain. Pt has complaints of occasionally R knee buckling however back feels much worse generally and wants to focus on the back pain, currently 3/10 and limits his ability to perform occupational tasks. Pt has notable deficits with global hip strength, hip flexor tightness, core activation patterns, and dynamic core stability.  Pt would benefit from therapeutic focus on posterior chain strengthening, dynamic core stability, global hip mobility/strengthening and postural education during functional motion.  Treatment performed today focused on pt education Pt demonstrated great understanding of education provided. required minimal verbal/tactile cues and no physical assistance for appropriate performance with today's activities. Pt requires the intervention of skilled outpatient physical therapy to address the aforementioned deficits and progress towards a functional level in line with therapeutic goals.   OBJECTIVE IMPAIRMENTS: decreased activity tolerance, decreased endurance, decreased mobility, decreased ROM, decreased strength, improper body mechanics, postural dysfunction, and pain.   ACTIVITY LIMITATIONS: carrying, lifting, bending, standing, locomotion level, and caring for others  PARTICIPATION LIMITATIONS: meal prep, community activity, and occupation  PERSONAL FACTORS: Behavior pattern, Past/current experiences, Social background, and Time since onset of injury/illness/exacerbation are also affecting  patient's functional outcome.   REHAB POTENTIAL: Good  CLINICAL DECISION MAKING: Stable/uncomplicated  EVALUATION COMPLEXITY: Low   GOALS: Goals reviewed with patient? YES  SHORT TERM GOALS: Target date: 03/20/2024   Pt will be independent with administered HEP to demonstrate the competency necessary for long term managemnet of symptoms at home.  Baseline: Goal status: INITIAL    LONG TERM GOALS: Target date: 04/10/2024  Pt. Will achieve a LEFS score of 80/80 as to demonstrate improvement in self-perceived functional ability with daily activities.  Baseline: 76/80 Goal status: INITIAL  2.  Pt will improve Global Hip/knee strength to a  4+/5 to demonstrate improvement in strength for quality of motion and activity performance.  Baseline: see obj chart Goal status: INITIAL  3.  Pt will report the ability to stand all day during work as to demonstrate improved tolerance to standing for prolonged time and improved ability to participate in occupational tasks.  Baseline: "sometimes can push through with 4/10 pain" Goal status: INITIAL  4.  Pt will hold a 90/90 with a posterior pelvic tilt for 45s to demonstrate improved core stability and endurance necessary for ADLs and occupational tasks. Baseline:  15s with shaking Goal status: INITIAL    --------------------------------------------------------------------------------------------- PLAN:  PT FREQUENCY: 1-2x/week  PT DURATION: 6 weeks  PLANNED INTERVENTIONS: 97110-Therapeutic exercises, 97530- Therapeutic activity, 97112- Neuromuscular re-education, 97535- Self Care, 16109- Manual therapy, [phone]- Gait training, Patient/Family education, Stair training, Taping, Dry Needling, Joint mobilization, and Cryotherapy  PLAN FOR NEXT SESSION:  Continue with therapeutic focus on functional activity tolerance, dynamic core activation, and begin transitioning to a long term HEP for self-management of symptoms.   Albesa Huguenin, PT,  DPT 03/20/2024, 9:12 AM  For all possible CPT codes, reference the Planned Interventions line above.     Check all conditions that are expected to impact treatment: {Conditions expected to impact treatment:Neurological condition and/or seizures   If treatment provided at initial evaluation, no treatment charged due to lack of authorization.

## 2024-03-27 ENCOUNTER — Ambulatory Visit: Payer: MEDICAID | Admitting: Physical Therapy

## 2024-03-27 NOTE — Therapy (Incomplete)
 OUTPATIENT PHYSICAL THERAPY TREATMENT & PROGRESS NOTE    Patient Name: Shawn Leach MRN: [id number] DOB:05-23-1981, 43 y.o., male Today's Date: 03/27/2024  END OF SESSION:       Past Medical History:  Diagnosis Date   Abdominal spasms 12/05/2019   Bipolar 1 disorder (HCC)    Bradycardia    asymptomatic, normal EKG and BMET   Chronic diarrhea of unknown origin 08/25/2021   Gonococcal urethritis    hx in 5/03   High risk homosexual behavior    Hx of relationship with man 2002-2005, no known disease in the partnet   History of psychiatric disorder    unknown prior diagnosis   Schizoaffective disorder, unspecified type (HCC) 03/16/2007   Diagnosed since childhood - teenage years Has been on medications including Depakote  and Seroquel  since then  Symptoms are stable but on disability due to this mental d/o  Late 2014: Fired from [employer] due to aggressive behavior 2015 Started attending [facility] in [place]      Schizophrenia [facility])    Shoulder weakness 02/23/2018   Tympanic membrane rupture    history of   No past surgical history on file. 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  PERTINENT  HISTORY: Schizoaffective disorder bipolar type, prediabetes,  WEIGHT BEARING RESTRICTIONS: No  FALLS:  Has patient fallen in last 6 months? 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 Pt stated that they have maintained *** compliance with current HEP.  ***  Eval statement 02/28/2024: pt currently in 0/10 pain in leg onset a few weeks ago, back has been hurting all the time for as long as he can remember. Feels like a 3/10 currently. Hurts most, when bending forward for a long time, can stand all day as long as he gets intermittent rest. Has intermittent N/T going down R leg.  RED FLAGS: None   PLOF: Independent  PATIENT GOALS: make the back feel better.  NEXT MD VISIT: need to schedule ---------------------------------------------------------------------------------------------  OBJECTIVE:  Note: Objective measures were completed at Evaluation unless otherwise noted.  DIAGNOSTIC FINDINGS: no recent imaging  PATIENT SURVEYS:  LEFS 76/80  COGNITION: Overall cognitive status: Within functional limits for tasks assessed     SENSATION: WFL  EDEMA:  No notable edema  MUSCLE LENGTH: Hamstrings:B imited Thomas test: B limited in rec fem  POSTURE: increased lumbar lordosis  PALPATION: Tenderness to B piriformis   LOWER EXTREMITY ROM:  Passive ROM Right eval Left  eval  Hip flexion [facility]  Hip extension Limited  limited  Hip abduction    Hip adduction    Hip internal rotation limited limited  Hip external rotation [facility]  Knee  flexion    Knee extension    Ankle dorsiflexion    Ankle plantarflexion    Ankle inversion    Ankle eversion     (Blank rows = not tested)  ! Indicates pain with testing  LOWER EXTREMITY MMT:  MMT Right eval Left eval  Hip flexion 4- 4-  Hip extension    Hip abduction    Hip adduction    Hip internal rotation    Hip external rotation    Knee flexion 4+ 4  Knee extension 4- 4  Ankle dorsiflexion    Ankle plantarflexion    Ankle inversion    Ankle eversion     (Blank rows = not tested)  ! Indicates pain with testing  LOWER EXTREMITY SPECIAL TESTS:  Hip special tests: Portia Brittle (FABER) test: positive   90/90 hold: 15s, notable shaking  GAIT: Distance walked: 173ft Assistive device utilized: None Level of assistance: Complete Independence Comments: gait [facility]   [facility] Adult PT Treatment:                                                DATE: 03/27/2024  Therapeutic Exercise: *** Manual Therapy: *** Neuromuscular re-ed: *** Therapeutic Activity: *** Modalities: *** Self Care: ***   [facility] Adult PT Treatment:                                                DATE: 03/13/2024  Therapeutic Exercise: Nustep  L6 8' Standing quad/hip flexor stretch 2x1' R Therapeutic Activity: PPT 1x10, hold 3s Supine bridge w/PPT, GTB 2x12, hold 5s  Hip hinge RDL practice  1x12, hold 2s, no weight 90/90 hold w/PPT 3x30s                                                                                                                         PATIENT EDUCATION:  Education details: Pt received education regarding HEP performance, ADL performance, functional activity tolerance, impairment education, appropriate performance of therapeutic activities. Person educated: Patient Education method: Explanation, Demonstration, Tactile cues, Verbal cues, and Handouts Education comprehension: verbalized understanding and returned demonstration  HOME EXERCISE PROGRAM: Access Code:  [facility] URL: [email or web address] Date: 02/28/2024 Prepared by: Albesa Huguenin  Exercises - Supine Lower Trunk Rotation  - 1 x daily - 5 x weekly - 2 sets - 12 reps - 2s hold - Supine Piriformis Stretch with Foot on Ground  - 1 x daily - 5 x weekly - 2 sets - 1 reps - 43m hold -  Hip Flexor Stretch at Edge of Bed  - 1 x daily - 5 x weekly - 2 sets - 1 reps - 62m hold - Supine Bridge  - 1 x daily - 7 x weekly - 3 sets - 10 reps - 4s hold - Supine 90/90 Alternating Heel Touches with Posterior Pelvic Tilt  - 1 x daily - 5 x weekly - 3 sets - 10 reps ---------------------------------------------------------------------------------------------  ASSESSMENT:  CLINICAL IMPRESSION:  Pt attended physical therapy session for re-evaluation of ***. Pt has made *** progress in *** areas of current POC. Pt has met  *** goals and continues to work towards *** others. Difficulties continue with ***. Pt required *** cuing as well as *** assistance for safe and appropriate performance of ***. Continue with therapeutic focus on ***.   Eval impression (02/28/2024): Pt. attended today's physical therapy session for evaluation of low back pain and RLE pain. Pt has complaints of occasionally R knee buckling however back feels much worse generally and wants to focus on the back pain, currently 3/10 and limits his ability to perform occupational tasks. Pt has notable deficits with global hip strength, hip flexor tightness, core activation patterns, and dynamic core stability.  Pt would benefit from therapeutic focus on posterior chain strengthening, dynamic core stability, global hip mobility/strengthening and postural education during functional motion.  Treatment performed today focused on pt education Pt demonstrated great understanding of education provided. required minimal verbal/tactile cues and no physical assistance for appropriate performance with today's activities. Pt requires the intervention of  skilled outpatient physical therapy to address the aforementioned deficits and progress towards a functional level in line with therapeutic goals.   OBJECTIVE IMPAIRMENTS: decreased activity tolerance, decreased endurance, decreased mobility, decreased ROM, decreased strength, improper body mechanics, postural dysfunction, and pain.   ACTIVITY LIMITATIONS: carrying, lifting, bending, standing, locomotion level, and caring for others  PARTICIPATION LIMITATIONS: meal prep, community activity, and occupation  PERSONAL FACTORS: Behavior pattern, Past/current experiences, Social background, and Time since onset of injury/illness/exacerbation are also affecting patient's functional outcome.   REHAB POTENTIAL: Good  CLINICAL DECISION MAKING: Stable/uncomplicated  EVALUATION COMPLEXITY: Low   GOALS: Goals reviewed with patient? YES  SHORT TERM GOALS: Target date: 03/20/2024   Pt will be independent with administered HEP to demonstrate the competency necessary for long term managemnet of symptoms at home.  Baseline: Goal status: INITIAL    LONG TERM GOALS: Target date: 04/10/2024  Pt. Will achieve a LEFS score of 80/80 as to demonstrate improvement in self-perceived functional ability with daily activities.  Baseline: 76/80 Goal status: INITIAL  2.  Pt will improve Global Hip/knee strength to a 4+/5 to demonstrate improvement in strength for quality of motion and activity performance.  Baseline: see obj chart Goal status: INITIAL  3.  Pt will report the ability to stand all day during work as to demonstrate improved tolerance to standing for prolonged time and improved ability to participate in occupational tasks.  Baseline: "sometimes can push through with 4/10 pain" Goal status: INITIAL  4.  Pt will hold a 90/90 with a posterior pelvic tilt for 45s to demonstrate improved core stability and endurance necessary for ADLs and occupational tasks. Baseline:  15s with shaking Goal  status: INITIAL    --------------------------------------------------------------------------------------------- PLAN:  PT FREQUENCY: 1-2x/week  PT DURATION: 6 weeks  PLANNED INTERVENTIONS: 97110-Therapeutic exercises, 97530- Therapeutic activity, [id number]- Neuromuscular re-education, 97535- Self Care, 40981- Manual therapy, [phone]- Gait training, Patient/Family education, Stair training, Taping, Dry Needling, Joint mobilization, and Cryotherapy  PLAN FOR NEXT SESSION:  Continue with therapeutic focus on functional activity tolerance, dynamic core activation, and begin transitioning to a long term HEP for self-management of symptoms.   Albesa Huguenin, PT, DPT 03/27/2024, 8:19 AM  For all possible CPT codes, reference the Planned Interventions line above.     Check all conditions that are expected to impact treatment: {Conditions expected to impact treatment:Neurological condition and/or seizures   If treatment provided at initial evaluation, no treatment charged due to lack of authorization.

## 2024-04-11 ENCOUNTER — Ambulatory Visit: Payer: MEDICAID | Admitting: Physical Therapy

## 2024-04-11 ENCOUNTER — Encounter: Payer: Self-pay | Admitting: Physical Therapy

## 2024-04-11 DIAGNOSIS — M5459 Other low back pain: Secondary | ICD-10-CM

## 2024-04-11 DIAGNOSIS — M7099 Unspecified soft tissue disorder related to use, overuse and pressure multiple sites: Secondary | ICD-10-CM

## 2024-04-11 DIAGNOSIS — M6281 Muscle weakness (generalized): Secondary | ICD-10-CM

## 2024-04-11 NOTE — Therapy (Signed)
 OUTPATIENT PHYSICAL THERAPY TREATMENT & PROGRESS NOTE    Patient Name: Shawn Leach MRN: [id number] DOB:April 21, 1981, 43 y.o., male Today's Date: 04/11/2024  END OF SESSION:  PT End of Session - 04/11/24 0826     Visit Number 5    Number of Visits 7    Date for PT Re-Evaluation 04/10/24    Authorization Type [employer]    PT Start Time 0827    PT Stop Time 0852    PT Time Calculation (min) 25 min    Activity Tolerance Patient tolerated treatment well    Behavior During Therapy [facility] for tasks assessed/performed                 Past Medical History:  Diagnosis Date   Abdominal spasms 12/05/2019   Bipolar 1 disorder (HCC)    Bradycardia    asymptomatic, normal EKG and BMET   Chronic diarrhea of unknown origin 08/25/2021   Gonococcal urethritis    hx in 5/03   High risk homosexual behavior    Hx of relationship with man 2002-2005, no known disease in the partnet   History of psychiatric disorder    unknown prior diagnosis   Schizoaffective disorder, unspecified type (HCC) 03/16/2007   Diagnosed since childhood - teenage years Has been on medications including Depakote  and Seroquel  since then  Symptoms are stable but on disability due to this mental d/o  Late 2014: Fired from [employer] due to aggressive behavior 2015 Started attending [facility] in [place]      Schizophrenia [facility])    Shoulder weakness 02/23/2018   Tympanic membrane rupture    history of   History reviewed. No pertinent surgical history. 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   Back pain 06/05/2017   Encounter for immunization 07/08/2016   Adjustment disorder with emotional disturbance 06/28/2014   Health  care maintenance 01/03/2013   History of alcohol use 01/03/2013   Schizoaffective disorder, bipolar type (HCC) 03/16/2007    PCP: Vicente Graham, DO  REFERRING PROVIDER: Sherol Dixie, MD  REFERRING DIAG:  Pes anserine bursitis [M70.50]   Rationale for Evaluation and Treatment: Rehabilitation  THERAPY DIAG:  Unspecified soft tissue disorder related to use, overuse and pressure multiple sites  Muscle weakness (generalized)  Other low back pain  PERTINENT HISTORY: Schizoaffective disorder bipolar type, prediabetes,  WEIGHT BEARING RESTRICTIONS: No  FALLS:  Has patient fallen in last 6 months? 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 Feels tired today as he has been working with the food truck all week. Was able to work all week without any severe increase in pain like before.  Eval statement 02/28/2024: pt currently in 0/10 pain in leg onset a few weeks ago, back has been hurting all the time for as long as he can remember. Feels like  a 3/10 currently. Hurts most, when bending forward for a long time, can stand all day as long as he gets intermittent rest. Has intermittent N/T going down R leg.  RED FLAGS: None   PLOF: Independent  PATIENT GOALS: make the back feel better.  NEXT MD VISIT: need to schedule ---------------------------------------------------------------------------------------------  OBJECTIVE:  Note: Objective measures were completed at  Evaluation unless otherwise noted.  DIAGNOSTIC FINDINGS: no recent imaging  PATIENT SURVEYS:  LEFS 76/80 04/11/2024:LEFS 80/80, LEFS: 1/50  COGNITION: Overall cognitive status: Within functional limits for tasks assessed     SENSATION: WFL  EDEMA:  No notable edema  MUSCLE LENGTH: Hamstrings:B imited Thomas test: B limited in rec fem  POSTURE: increased lumbar lordosis  PALPATION: Tenderness to B piriformis   LOWER EXTREMITY ROM:  Passive ROM Right eval Left eval  Hip flexion [facility]  Hip extension Limited  limited  Hip abduction    Hip adduction    Hip internal rotation limited limited  Hip external rotation [facility]  Knee flexion    Knee extension    Ankle dorsiflexion    Ankle plantarflexion    Ankle inversion    Ankle eversion     (Blank rows = not tested)  ! Indicates pain with testing  LOWER EXTREMITY MMT:  MMT Right eval 04/11/2024  Left eval 04/11/2024   Hip flexion 4- 5 4- 5  Hip extension      Hip abduction      Hip adduction      Hip internal rotation      Hip external rotation      Knee flexion 4+ 5 4 5   Knee extension 4- 4+ 4 4+  Ankle dorsiflexion      Ankle plantarflexion      Ankle inversion      Ankle eversion       (Blank rows = not tested)  ! Indicates pain with testing  LOWER EXTREMITY SPECIAL TESTS:  Hip special tests: Portia Brittle (FABER) test: positive   90/90 hold: 15s, notable shaking  GAIT: Distance walked: 140ft Assistive device utilized: None Level of assistance: Complete Independence Comments: gait [facility]   [facility] Adult PT Treatment:                                                DATE: 03/27/2024 Therapeutic Activity: Re-evaluative measures Self Care: POC discussion Pt education   [facility] Adult PT Treatment:                                                DATE: 03/13/2024  Therapeutic Exercise: Nustep  L6 8' Standing quad/hip flexor stretch 2x1' R Therapeutic Activity: PPT 1x10, hold 3s Supine bridge w/PPT,  GTB 2x12, hold 5s  Hip hinge RDL practice  1x12, hold 2s, no weight 90/90 hold w/PPT 3x30s  PATIENT EDUCATION:  Education details: Pt received education regarding HEP performance, ADL performance, functional activity tolerance, impairment education, appropriate performance of therapeutic activities. Person educated: Patient Education method: Explanation, Demonstration, Tactile cues, Verbal cues, and Handouts Education comprehension: verbalized understanding and returned demonstration  HOME EXERCISE PROGRAM: Access Code: [facility] URL: [email or web address] Date: 02/28/2024 Prepared by: Albesa Huguenin  Exercises - Supine Lower Trunk Rotation  - 1 x daily - 5 x weekly - 2 sets - 12 reps - 2s hold - Supine Piriformis Stretch with Foot on Ground  - 1 x daily - 5 x weekly - 2 sets - 1 reps - 39m hold - Hip Flexor Stretch at Edge of Bed  - 1 x daily - 5 x weekly - 2 sets - 1 reps - 71m hold - Supine Bridge  - 1 x daily - 7 x weekly - 3 sets - 10 reps - 4s hold - Supine 90/90 Alternating Heel Touches with Posterior Pelvic Tilt  - 1 x daily - 5 x weekly - 3 sets - 10 reps ---------------------------------------------------------------------------------------------  ASSESSMENT:  CLINICAL IMPRESSION:  Pt attended physical therapy session for re-evaluation of LBP. Pt has met All therapeutic goals and reports feeling content with current functional level as he can complete a heavy week of work with no direct increase in pain, just general muscle soreness. Pt required moderate verbal/tactile cuing as well as no physical assistance for safe and appropriate performance of today's activities. Education was given to continue applying education provided throughout this course of therapy as well as performing HEP as prescribed with freedom to progress as tolerated using  previous education on modification and exercise dosage. Pt has displayed and verbalized competence regarding this education. Pt is to be d/c at completion of today's session d/t meeting stated rehab goals and content with current functional level.   Eval impression (02/28/2024): Pt. attended today's physical therapy session for evaluation of low back pain and RLE pain. Pt has complaints of occasionally R knee buckling however back feels much worse generally and wants to focus on the back pain, currently 3/10 and limits his ability to perform occupational tasks. Pt has notable deficits with global hip strength, hip flexor tightness, core activation patterns, and dynamic core stability.  Pt would benefit from therapeutic focus on posterior chain strengthening, dynamic core stability, global hip mobility/strengthening and postural education during functional motion.  Treatment performed today focused on pt education Pt demonstrated great understanding of education provided. required minimal verbal/tactile cues and no physical assistance for appropriate performance with today's activities. Pt requires the intervention of skilled outpatient physical therapy to address the aforementioned deficits and progress towards a functional level in line with therapeutic goals.   OBJECTIVE IMPAIRMENTS: decreased activity tolerance, decreased endurance, decreased mobility, decreased ROM, decreased strength, improper body mechanics, postural dysfunction, and pain.   ACTIVITY LIMITATIONS: carrying, lifting, bending, standing, locomotion level, and caring for others  PARTICIPATION LIMITATIONS: meal prep, community activity, and occupation  PERSONAL FACTORS: Behavior pattern, Past/current experiences, Social background, and Time since onset of injury/illness/exacerbation are also affecting patient's functional outcome.   REHAB POTENTIAL: Good  CLINICAL DECISION MAKING: Stable/uncomplicated  EVALUATION COMPLEXITY:  Low   GOALS: Goals reviewed with patient? YES  SHORT TERM GOALS: Target date: 03/20/2024   Pt will be independent with administered HEP to demonstrate the competency necessary for long term managemnet of symptoms at home.  Baseline: Goal status: MET 04/11/2024    LONG TERM GOALS: Target date: 04/10/2024  Pt. Will achieve a LEFS  score of 80/80 as to demonstrate improvement in self-perceived functional ability with daily activities.  Baseline: 76/80 Goal status: MET 04/11/2024   2.  Pt will improve Global Hip/knee strength to a 4+/5 to demonstrate improvement in strength for quality of motion and activity performance.  Baseline: see obj chart Goal status: MET 04/11/2024  3.  Pt will report the ability to stand all day during work as to demonstrate improved tolerance to standing for prolonged time and improved ability to participate in occupational tasks.  Baseline: "sometimes can push through with 4/10 pain" Goal status: MET 04/11/2024   4.  Pt will hold a 90/90 with a posterior pelvic tilt for 45s to demonstrate improved core stability and endurance necessary for ADLs and occupational tasks. Baseline:  15s with shaking Goal status: MET 04/11/2024    --------------------------------------------------------------------------------------------- PLAN:  PT FREQUENCY: 1-2x/week  PT DURATION: 6 weeks  PLANNED INTERVENTIONS: 97110-Therapeutic exercises, 97530- Therapeutic activity, 97112- Neuromuscular re-education, 97535- Self Care, 45409- Manual therapy, [phone]- Gait training, Patient/Family education, Stair training, Taping, Dry Needling, Joint mobilization, and Cryotherapy  PLAN FOR NEXT SESSION:  D/C  PHYSICAL THERAPY DISCHARGE SUMMARY  Visits from Start of Care: 5  Current functional level related to goals / functional outcomes: See assessment   Remaining deficits: See assessment   Education / Equipment: See assessment   Patient agrees to discharge. Patient goals  were met. Patient is being discharged due to meeting the stated rehab goals.   Albesa Huguenin, PT, DPT 04/11/2024, 8:55 AM  For all possible CPT codes, reference the Planned Interventions line above.     Check all conditions that are expected to impact treatment: {Conditions expected to impact treatment:Neurological condition and/or seizures   If treatment provided at initial evaluation, no treatment charged due to lack of authorization.

## 2024-05-15 ENCOUNTER — Telehealth (HOSPITAL_BASED_OUTPATIENT_CLINIC_OR_DEPARTMENT_OTHER): Payer: MEDICAID | Admitting: Psychiatry

## 2024-05-15 ENCOUNTER — Encounter (HOSPITAL_COMMUNITY): Payer: Self-pay | Admitting: Psychiatry

## 2024-05-15 VITALS — Wt 135.0 lb

## 2024-05-15 DIAGNOSIS — F1210 Cannabis abuse, uncomplicated: Secondary | ICD-10-CM

## 2024-05-15 DIAGNOSIS — F250 Schizoaffective disorder, bipolar type: Secondary | ICD-10-CM | POA: Diagnosis not present

## 2024-05-15 MED ORDER — QUETIAPINE FUMARATE 50 MG PO TABS: 50.0000 mg | ORAL_TABLET | Freq: Every day | ORAL | 0 refills | Status: DC

## 2024-05-15 MED ORDER — DIVALPROEX SODIUM ER 500 MG PO TB24
ORAL_TABLET | ORAL | 2 refills | Status: DC
Start: 2024-05-15 — End: 2024-08-15

## 2024-05-15 NOTE — Progress Notes (Signed)
 [facility] MD Virtual Progress Note   Patient Location: Home Provider Location: Home office  I connect with patient by video and verified that I am speaking with correct person by using two identifiers. I discussed the limitations of evaluation and management by telemedicine and the availability of in person appointments. I also discussed with the patient that there may be a patient responsible charge related to this service. The patient expressed understanding and agreed to proceed.  Shawn Leach [id number] 43 y.o.  05/15/2024 9:29 AM  History of Present Illness:  Patient is evaluated by video session.  He reported medicine working and helping but still some time irritability and mood swings depending on the day but denies any paranoia, hallucination or any active or passive suicidal thoughts or homicidal thoughts.  He is taking Depakote  1500 mg at bedtime and Seroquel  50 mg at bedtime.  He admitted sometime do not take the medicine when he keeps the children with him.  Patient has 69 year old 41 year old 41 year old and 41 year old.  He does not want to be too sedated.  He admitted continue to smoke marijuana but overall intake has cut down.  His appetite is okay.  His weight is stable.  He does physical therapy for chronic shoulder pain.  He has no tremors, shakes or any EPS.  Has not seen PCP in a while but going to schedule appointment for physical and annual checkup.  His last Depakote  level was 12.5.  He admitted at the time not taking the medication he supposed to.  He likely combination of Depakote  and Seroquel  which is keeping him stable and he has no major side effects.  I have discussed stopping the Depakote  since level is subtherapeutic but patient admitted he was not taking at that time but now he is taking regularly and it is helping.  Past Psychiatric History: H/O psychiatric illness since young age.  H/O suicidal thoughts, severe anger, mania and hallucination.  Saw psychiatrist at the local mental health agency which closed. H/O one brief stay in the emergency room when intoxicated with alcohol. Took Seroquel  (groggy and tired).  No h/o suicidal attempt, nightmares flashback or OCD symptoms.     Outpatient Encounter Medications as of 05/15/2024  Medication Sig   divalproex  (DEPAKOTE  ER) 500 MG 24 hr tablet Take 3 tablets by mouth every night   naproxen  (NAPROSYN ) 500 MG tablet Take 1 tablet (500 mg total) by mouth 2 (two) times daily with a meal.   QUEtiapine  (SEROQUEL ) 50 MG tablet Take 1 tablet (50 mg total) by mouth at bedtime.   No facility-administered encounter medications on file as of 05/15/2024.    No results found for this or any previous visit (from the past 2160 hours).   Psychiatric Specialty Exam: Physical Exam  Review of Systems  Weight 135 lb (61.2 kg).There is no height or weight on file to calculate BMI.  General Appearance: Casual  Eye Contact:  Fair  Speech:  fast  Volume:  Normal  Mood:  I am doing ok  Affect:  Labile  Thought Process:  Descriptions of Associations: Intact  Orientation:  Full (Time, Place, and Person)  Thought Content:  WDL  Suicidal Thoughts:  No  Homicidal Thoughts:  No  Memory:  Immediate;   Fair Recent;   Fair Remote;   Fair  Judgement:  Fair  Insight:  Shallow  Psychomotor Activity:  Increased  Concentration:  Concentration: Fair and Attention Span: Fair  Recall:  [employer] of Knowledge:  Fair  Language:  Good  Akathisia:  No  Handed:  Right  AIMS (if indicated):     Assets:  Communication Skills Desire for Improvement Housing Transportation  ADL's:  Intact  Cognition:  WNL  Sleep:  good       10/04/2023    8:57 AM 05/17/2023    9:41 AM 02/10/2023    9:36 AM 02/02/2023    8:52 AM 08/26/2022    8:58 AM  Depression screen PHQ 2/9  Decreased Interest 0 0 0 0 0  Down, Depressed, Hopeless 0 0 0 0 0  PHQ - 2 Score 0 0 0 0 0  Altered sleeping  0   0  Tired, decreased energy  0   0   Change in appetite  0   0  Feeling bad or failure about yourself   0   0  Trouble concentrating  1   0  Moving slowly or fidgety/restless  0   0  Suicidal thoughts  0   0  PHQ-9 Score  1   0  Difficult doing work/chores     Not difficult at all    Assessment/Plan: Schizoaffective disorder, bipolar type (HCC) - Plan: divalproex  (DEPAKOTE  ER) 500 MG 24 hr tablet, QUEtiapine  (SEROQUEL ) 50 MG tablet  Tetrahydrocannabinol (THC) use disorder, mild, abuse - Plan: divalproex  (DEPAKOTE  ER) 500 MG 24 hr tablet  Patient is 43 year old man with history of alcohol use, possible SVT, cannabis use chronic pain currently on Seroquel  50 mg and Depakote  1500 mg at bedtime.  His symptoms are stable.  He has no side effects.  We talked about stopping his cannabis use which she had cut down from the past.  Encouraged to keep appoint with primary care.  Recommend to call us  back with any question or any concern.  Follow-up in 3 months.   Follow Up Instructions:     I discussed the assessment and treatment plan with the patient. The patient was provided an opportunity to ask questions and all were answered. The patient agreed with the plan and demonstrated an understanding of the instructions.   The patient was advised to call back or seek an in-person evaluation if the symptoms worsen or if the condition fails to improve as anticipated.    Collaboration of Care: Other provider involved in patient's care AEB notes are available in epic to review  Patient/Guardian was advised Release of Information must be obtained prior to any record release in order to collaborate their care with an outside provider. Patient/Guardian was advised if they have not already done so to contact the registration department to sign all necessary forms in order for us  to release information regarding their care.   Consent: Patient/Guardian gives verbal consent for treatment and assignment of benefits for services provided during  this visit. Patient/Guardian expressed understanding and agreed to proceed.     Total encounter time 18 minutes which includes face-to-face time, chart reviewed, care coordination, order entry and documentation during this encounter.   Note: This document was prepared by [employer] voice dictation technology and any errors that results from this process are unintentional.    Leni ONEIDA Client, MD 05/15/2024

## 2024-06-06 ENCOUNTER — Ambulatory Visit: Payer: MEDICAID

## 2024-06-06 ENCOUNTER — Other Ambulatory Visit: Payer: Self-pay

## 2024-06-06 VITALS — BP 118/71 | HR 64 | Ht 65.0 in | Wt 129.0 lb

## 2024-06-06 DIAGNOSIS — R002 Palpitations: Secondary | ICD-10-CM

## 2024-06-06 DIAGNOSIS — Z0000 Encounter for general adult medical examination without abnormal findings: Secondary | ICD-10-CM

## 2024-06-06 DIAGNOSIS — F250 Schizoaffective disorder, bipolar type: Secondary | ICD-10-CM

## 2024-06-06 DIAGNOSIS — R7303 Prediabetes: Secondary | ICD-10-CM

## 2024-06-06 DIAGNOSIS — F259 Schizoaffective disorder, unspecified: Secondary | ICD-10-CM

## 2024-06-06 LAB — POCT GLYCOSYLATED HEMOGLOBIN (HGB A1C): Hemoglobin A1C: 5.8 % — AB (ref 4.0–5.6)

## 2024-06-06 LAB — GLUCOSE, CAPILLARY: Glucose-Capillary: 92 mg/dL (ref 70–99)

## 2024-06-06 NOTE — Assessment & Plan Note (Signed)
 The patient presented for a physical exam with no specific concerns except for occasional episodes of dizziness and palpitations. On exam, heart and lung sounds were normal, and no swelling was noted. Psychiatric evaluation was normal. The last HbA1c was 5.9%. The last lipid panel was done 7 years ago and showed elevated triglycerides.  Plan: Order CMP, CBC, HbA1c, and lipid panel. Ensure follow-up with cardiology for further evaluation of dizziness and palpitations.

## 2024-06-06 NOTE — Progress Notes (Signed)
 CC: Annual Physical  HPI:  [name] is a 43 y.o. male living with a history stated below and presents today for Please see problem based assessment and plan for additional details.  Past Medical History:  Diagnosis Date   Abdominal spasms 12/05/2019   Bipolar 1 disorder (HCC)    Bradycardia    asymptomatic, normal EKG and BMET   Chronic diarrhea of unknown origin 08/25/2021   Gonococcal urethritis    hx in 5/03   High risk homosexual behavior    Hx of relationship with man 2002-2005, no known disease in the partnet   History of psychiatric disorder    unknown prior diagnosis   Schizoaffective disorder, unspecified type (HCC) 03/16/2007   Diagnosed since childhood - teenage years Has been on medications including Depakote  and Seroquel  since then  Symptoms are stable but on disability due to this mental d/o  Late 2014: Fired from [employer] due to aggressive behavior 2015 Started attending [facility] in [place]      Schizophrenia [facility])    Shoulder weakness 02/23/2018   Tympanic membrane rupture    history of    Current Outpatient Medications on File Prior to Visit  Medication Sig Dispense Refill   divalproex  (DEPAKOTE  ER) 500 MG 24 hr tablet Take 3 tablets by mouth every night 90 tablet 2   QUEtiapine  (SEROQUEL ) 50 MG tablet Take 1 tablet (50 mg total) by mouth at bedtime. 90 tablet 0   No current facility-administered medications on file prior to visit.    History reviewed. No pertinent family history.  Social History   Socioeconomic History   Marital status: Single    Spouse name: Not on file   Number of children: Not on file   Years of education: Not on file   Highest education level: Not on file  Occupational History   Not on file  Tobacco Use   Smoking status: Former    Types: Cigars    Quit date: 01/14/2011    Years since quitting: 13.4   Smokeless tobacco: Never  Vaping Use   Vaping status: Never Used  Substance and Sexual Activity   Alcohol  use: Yes    Comment: occ   Drug use: Yes    Frequency: 2.0 times per week    Types: Marijuana   Sexual activity: Yes    Partners: Female    Comment: disabled on SSI, single, has a girlfriend (now only heterosexual) and 2 daughters,   Other Topics Concern   Not on file  Social History Narrative      Are you right handed or left handed? Right handed    Are you currently employed ? 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 no   Who lives with you? Lives with aunts    What type of home do you live in: 1 story or 2 story? Has steps        Social Drivers of Health   Financial Resource Strain: Medium Risk (06/06/2024)   Overall Financial Resource Strain (CARDIA)    Difficulty of Paying Living Expenses: Somewhat hard  Food Insecurity: No Food Insecurity (02/06/2024)   Hunger Vital Sign    Worried About Running Out of Food in the Last Year: Never true    Ran Out of Food in the Last Year: Never true  Transportation Needs: No Transportation Needs (02/06/2024)   PRAPARE - Administrator, Civil Service (Medical):  No    Lack of Transportation (Non-Medical): No  Physical Activity: Sufficiently Active (06/06/2024)   Exercise Vital Sign    Days of Exercise per Week: 5 days    Minutes of Exercise per Session: 100 min  Stress: Stress Concern Present (06/06/2024)   [employer] of Occupational Health - Occupational Stress Questionnaire    Feeling of Stress: To some extent  Social Connections: Moderately Isolated (06/06/2024)   Social Connection and Isolation Panel    Frequency of Communication with Friends and Family: More than three times a week    Frequency of Social Gatherings with Friends and Family: Twice a week    Attends Religious Services: More than 4 times per year    Active Member of [employer] or Organizations: No    Attends Banker Meetings: Never    Marital Status: Never married  Intimate Partner Violence: Not At Risk  (02/06/2024)   Humiliation, Afraid, Rape, and Kick questionnaire    Fear of Current or Ex-Partner: No    Emotionally Abused: No    Physically Abused: No    Sexually Abused: No    Review of Systems: ROS negative except for what is noted on the assessment and plan.  Vitals:   06/06/24 0839  BP: 118/71  Pulse: 64  TempSrc: Oral  SpO2: 98%  Weight: 129 lb (58.5 kg)  Height: 5' 5 (1.651 m)    Physical Exam  Physical Exam: Constitutional: well-appearing in no acute distress HENT: normocephalic atraumatic, mucous membranes moist Eyes: conjunctiva non-erythematous Neck: supple Cardiovascular: regular rate and rhythm, no m/r/g Pulmonary/Chest: normal work of breathing on room air, lungs clear to auscultation bilaterally Abdominal: soft, non-tender, non-distended, negative for CVA tenderness Neurological: alert & oriented x 3  Assessment & Plan:   Annual physical exam The patient presented for a physical exam with no specific concerns except for occasional episodes of dizziness and palpitations. On exam, heart and lung sounds were normal, and no swelling was noted. Psychiatric evaluation was normal. The last HbA1c was 5.9%. The last lipid panel was done 7 years ago and showed elevated triglycerides.  Plan: Order CMP, CBC, HbA1c, and lipid panel. Ensure follow-up with cardiology for further evaluation of dizziness and palpitations.  Schizoaffective disorder, bipolar type [facility])  The patient came for an annual physical exam. She is currently taking quetiapine  and Depakote . Lab work including CBC and CMP was ordered to monitor for potential side effects.  Palpitations Patient here for annual physical, reports dizziness and palpitations when standing for long periods. No nausea, vomiting, or syncope. Episodes last minutes and resolve on their own.  Objective: Heart rate 55 bpm, regular rhythm with occasional extra beats. No murmurs. Prior cardiology workup including EKG and echo  normal. TSH and anemia screening normal.  Assessment: Intermittent tachycardia with episodes up to 160 bpm on a baseline of 50 bpm. Medication may cause bradycardia.  Plan:  Advise patient to track triggers like food and drinks before episodes. Continue cardiology follow-up.     Patient seen with Dr. Karna Armando Rossetti M.D [facility] Internal Medicine, PGY-1 Phone: [phone] Date 06/06/2024 Time 10:52 AM

## 2024-06-06 NOTE — Patient Instructions (Addendum)
 Dear Mr. Shawn Leach,  Thank you for allowing us  to provide your care today. During your visit, we reviewed your physical exam and ordered lab work, including blood sugar testing, a lipid panel, and a metabolic panel.  We also discussed the importance of quitting smoking and reducing or quitting alcohol use for your overall health. If you need support or resources to help with this, please let us  know, we're here to help.  Additionally, we encourage you to follow up with your cardiologist regarding your palpitations.  It was a pleasure seeing you today. If you have any questions or concerns, please don't hesitate to reach out to us .  I have ordered the following labs for you:   Lab Orders         CBC no Diff         Comprehensive metabolic panel with GFR         Lipid Profile         POC Hbg A1C      Tests ordered today:   Referrals ordered today:   Referral Orders  No referral(s) requested today     I have ordered the following medication/changed the following medications:   Stop the following medications: There are no discontinued medications.   Start the following medications: No orders of the defined types were placed in this encounter.    Follow up: 1 year  Remember:   Should you have any questions or concerns please call the internal medicine clinic at [phone].    Armando Rossetti, M.D [facility]

## 2024-06-06 NOTE — Assessment & Plan Note (Addendum)
 Patient here for annual physical, reports dizziness and palpitations when standing for long periods. No nausea, vomiting, or syncope. Episodes last minutes and resolve on their own.  Objective: Heart rate 55 bpm, regular rhythm with occasional extra beats. No murmurs. Prior cardiology workup including EKG and echo normal. TSH and anemia screening normal.  Assessment: Intermittent tachycardia with episodes up to 160 bpm on a baseline of 50 bpm. Medication may cause bradycardia.  Plan:  Advise patient to track triggers like food and drinks before episodes. Continue cardiology follow-up.

## 2024-06-06 NOTE — Assessment & Plan Note (Deleted)
 Patient was here for annual physical exam today, as he takes depakote  and quetiapine , I send cbc and cmp to evaluate.

## 2024-06-06 NOTE — Assessment & Plan Note (Signed)
  The patient came for an annual physical exam. She is currently taking quetiapine  and Depakote . Lab work including CBC and CMP was ordered to monitor for potential side effects.

## 2024-06-07 ENCOUNTER — Telehealth: Payer: Self-pay | Admitting: *Deleted

## 2024-06-07 LAB — COMPREHENSIVE METABOLIC PANEL WITH GFR
ALT: 16 IU/L (ref 0–44)
AST: 28 IU/L (ref 0–40)
Albumin: 4.6 g/dL (ref 4.1–5.1)
Alkaline Phosphatase: 43 IU/L — ABNORMAL LOW (ref 44–121)
BUN/Creatinine Ratio: 21 — ABNORMAL HIGH (ref 9–20)
BUN: 23 mg/dL (ref 6–24)
Bilirubin Total: 0.7 mg/dL (ref 0.0–1.2)
CO2: 21 mmol/L (ref 20–29)
Calcium: 9.8 mg/dL (ref 8.7–10.2)
Chloride: 98 mmol/L (ref 96–106)
Creatinine, Ser: 1.08 mg/dL (ref 0.76–1.27)
Globulin, Total: 2.9 g/dL (ref 1.5–4.5)
Glucose: 83 mg/dL (ref 70–99)
Potassium: 4.4 mmol/L (ref 3.5–5.2)
Sodium: 135 mmol/L (ref 134–144)
Total Protein: 7.5 g/dL (ref 6.0–8.5)
eGFR: 88 mL/min/1.73 (ref >59)

## 2024-06-07 LAB — LIPID PANEL
Chol/HDL Ratio: 2.1 ratio (ref 0.0–5.0)
Cholesterol, Total: 194 mg/dL (ref 100–199)
HDL: 94 mg/dL (ref >39)
LDL Chol Calc (NIH): 88 mg/dL (ref 0–99)
Triglycerides: 68 mg/dL (ref 0–149)
VLDL Cholesterol Cal: 12 mg/dL (ref 5–40)

## 2024-06-07 LAB — CBC
Hematocrit: 45.3 % (ref 37.5–51.0)
Hemoglobin: 14.6 g/dL (ref 13.0–17.7)
MCH: 29.6 pg (ref 26.6–33.0)
MCHC: 32.2 g/dL (ref 31.5–35.7)
MCV: 92 fL (ref 79–97)
Platelets: 244 x10E3/uL (ref 150–450)
RBC: 4.94 x10E6/uL (ref 4.14–5.80)
RDW: 12.9 % (ref 11.6–15.4)
WBC: 5 x10E3/uL (ref 3.4–10.8)

## 2024-06-07 NOTE — Telephone Encounter (Signed)
 Copied from CRM [id number]. Topic: Clinical - Lab/Test Results >> Jun 07, 2024 10:20 AM [name] wrote: Reason for CRM: Patient is calling because he is wanting to speak to the provider about his lab because he has some questions and concerns, could you please assist? Callback number is [phone].

## 2024-06-08 ENCOUNTER — Ambulatory Visit: Payer: Self-pay

## 2024-06-08 DIAGNOSIS — Z0000 Encounter for general adult medical examination without abnormal findings: Secondary | ICD-10-CM

## 2024-06-08 NOTE — Progress Notes (Signed)
 Internal Medicine Clinic Attending  I was physically present during the key portions of the resident provided service and participated in the medical decision making of patient's management care. I reviewed pertinent patient test results.  The assessment, diagnosis, and plan were formulated together and I agree with the documentation in the resident's note. Discussed with Mr. Shawn Leach the need to schedule 3 month cardiology f/u, as well as discontinue alcohol use.  Karna Fellows, MD

## 2024-08-15 ENCOUNTER — Telehealth (HOSPITAL_COMMUNITY): Payer: MEDICAID | Admitting: Psychiatry

## 2024-08-15 ENCOUNTER — Other Ambulatory Visit (HOSPITAL_COMMUNITY): Payer: Self-pay | Admitting: *Deleted

## 2024-08-15 ENCOUNTER — Encounter (HOSPITAL_COMMUNITY): Payer: Self-pay | Admitting: Psychiatry

## 2024-08-15 VITALS — Wt 129.0 lb

## 2024-08-15 DIAGNOSIS — F250 Schizoaffective disorder, bipolar type: Secondary | ICD-10-CM

## 2024-08-15 DIAGNOSIS — F1210 Cannabis abuse, uncomplicated: Secondary | ICD-10-CM | POA: Diagnosis not present

## 2024-08-15 DIAGNOSIS — Z5181 Encounter for therapeutic drug level monitoring: Secondary | ICD-10-CM

## 2024-08-15 MED ORDER — DIVALPROEX SODIUM ER 500 MG PO TB24: ORAL_TABLET | ORAL | 2 refills | Status: DC

## 2024-08-15 MED ORDER — QUETIAPINE FUMARATE 50 MG PO TABS: 50.0000 mg | ORAL_TABLET | Freq: Every day | ORAL | 0 refills | Status: DC

## 2024-08-15 NOTE — Progress Notes (Signed)
 [facility] MD Virtual Progress Note   Patient Location: At friend`s house Provider Location: Home Office  I connect with patient by video and verified that I am speaking with correct person by using two identifiers. I discussed the limitations of evaluation and management by telemedicine and the availability of in person appointments. I also discussed with the patient that there may be a patient responsible charge related to this service. The patient expressed understanding and agreed to proceed.  Shawn Leach [id number] 43 y.o.  08/15/2024 10:22 AM  History of Present Illness:  Patient is evaluated by video session.  He is taking Depakote  and Seroquel .  He reported it is keeping her mood stable and denies any irritability, mania, hallucination or any paranoia.  He continues to smoke marijuana and sometime drinks liquor but has not had a beer in 3 years.  We have discussion in the past to stop the cannabis but patient feel it does calm him down.  Patient reported since school started he is busy in his family.  He has 41 year old, 41 year old, 41 year old and 42 year old.  Denies any active or passive suicidal thoughts or homicidal thoughts.  He has blood work on July 23 and hemoglobin A1c 5.9 which is stable.  His PCP did not do Depakote  level.  Patient denies any tremors shakes or any EPS.  He denies any other concern from the medication.  He like to keep the Depakote  and Seroquel  which is keeping him stable.  Past Psychiatric History: H/O psychiatric illness since young age.  H/O suicidal thoughts, severe anger, mania and hallucination. Saw psychiatrist at the local mental health agency which closed. H/O one brief stay in the emergency room when intoxicated with alcohol. Took Seroquel  (groggy and tired).  No h/o suicidal attempt, nightmares flashback or OCD symptoms.      Past Medical History:  Diagnosis Date   Abdominal spasms 12/05/2019   Bipolar 1 disorder (HCC)     Bradycardia    asymptomatic, normal EKG and BMET   Chronic diarrhea of unknown origin 08/25/2021   Gonococcal urethritis    hx in 5/03   High risk homosexual behavior    Hx of relationship with man 2002-2005, no known disease in the partnet   History of psychiatric disorder    unknown prior diagnosis   Schizoaffective disorder, unspecified type (HCC) 03/16/2007   Diagnosed since childhood - teenage years Has been on medications including Depakote  and Seroquel  since then  Symptoms are stable but on disability due to this mental d/o  Late 2014: Fired from [employer] due to aggressive behavior 2015 Started attending [facility] in [place]      Schizophrenia [facility])    Shoulder weakness 02/23/2018   Tympanic membrane rupture    history of    Outpatient Encounter Medications as of 08/15/2024  Medication Sig   divalproex  (DEPAKOTE  ER) 500 MG 24 hr tablet Take 3 tablets by mouth every night   QUEtiapine  (SEROQUEL ) 50 MG tablet Take 1 tablet (50 mg total) by mouth at bedtime.   No facility-administered encounter medications on file as of 08/15/2024.    Recent Results (from the past 2160 hours)  CBC no Diff     Status: None   Collection Time: 06/06/24  9:33 AM  Result Value Ref Range   WBC 5.0 3.4 - 10.8 x10E3/uL   RBC 4.94 4.14 - 5.80 x10E6/uL   Hemoglobin 14.6 13.0 - 17.7 g/dL   Hematocrit 54.6 62.4 - 51.0 %   MCV 92 79 -  97 fL   MCH 29.6 26.6 - 33.0 pg   MCHC 32.2 31.5 - 35.7 g/dL   RDW 87.0 88.3 - 84.5 %   Platelets 244 150 - 450 x10E3/uL  Comprehensive metabolic panel with GFR     Status: Abnormal   Collection Time: 06/06/24  9:33 AM  Result Value Ref Range   Glucose 83 70 - 99 mg/dL   BUN 23 6 - 24 mg/dL   Creatinine, Ser 8.91 0.76 - 1.27 mg/dL   eGFR 88 >40 fO/fpw/8.26   BUN/Creatinine Ratio 21 (H) 9 - 20   Sodium 135 134 - 144 mmol/L   Potassium 4.4 3.5 - 5.2 mmol/L   Chloride 98 96 - 106 mmol/L   CO2 21 20 - 29 mmol/L   Calcium 9.8 8.7 - 10.2 mg/dL   Total Protein 7.5 6.0 -  8.5 g/dL   Albumin 4.6 4.1 - 5.1 g/dL   Globulin, Total 2.9 1.5 - 4.5 g/dL   Bilirubin Total 0.7 0.0 - 1.2 mg/dL   Alkaline Phosphatase 43 (L) 44 - 121 IU/L   AST 28 0 - 40 IU/L   ALT 16 0 - 44 IU/L  Lipid Profile     Status: None   Collection Time: 06/06/24  9:33 AM  Result Value Ref Range   Cholesterol, Total 194 100 - 199 mg/dL   Triglycerides 68 0 - 149 mg/dL   HDL 94 >60 mg/dL   VLDL Cholesterol Cal 12 5 - 40 mg/dL   LDL Chol Calc (NIH) 88 0 - 99 mg/dL   Chol/HDL Ratio 2.1 0.0 - 5.0 ratio    Comment:                                   T. Chol/HDL Ratio                                             Men  Women                               1/2 Avg.Risk  3.4    3.3                                   Avg.Risk  5.0    4.4                                2X Avg.Risk  9.6    7.1                                3X Avg.Risk 23.4   11.0   POC Hbg A1C     Status: Abnormal   Collection Time: 06/06/24  9:33 AM  Result Value Ref Range   Hemoglobin A1C 5.8 (A) 4.0 - 5.6 %   HbA1c POC (<> result, manual entry)     HbA1c, POC (prediabetic range)     HbA1c, POC (controlled diabetic range)    Glucose, capillary     Status: None   Collection Time: 06/06/24  9:33 AM  Result Value  Ref Range   Glucose-Capillary 92 70 - 99 mg/dL    Comment: Glucose reference range applies only to samples taken after fasting for at least 8 hours.     Psychiatric Specialty Exam: Physical Exam  Review of Systems  Weight 129 lb (58.5 kg).There is no height or weight on file to calculate BMI.  General Appearance: Casual and long hair  Eye Contact:  Fair  Speech:  Slow  Volume:  Decreased  Mood:  Euthymic  Affect:  Congruent  Thought Process:  Descriptions of Associations: Intact  Orientation:  Full (Time, Place, and Person)  Thought Content:  WDL  Suicidal Thoughts:  No  Homicidal Thoughts:  No  Memory:  Immediate;   Fair Recent;   Fair Remote;   Fair  Judgement:  Fair  Insight:  Shallow  Psychomotor  Activity:  Decreased  Concentration:  Concentration: Fair and Attention Span: Fair  Recall:  [employer] of Knowledge:  Fair  Language:  Good  Akathisia:  No  Handed:  Right  AIMS (if indicated):     Assets:  Communication Skills Desire for Improvement Social Support Transportation  ADL's:  Intact  Cognition:  WNL  Sleep:  ok       06/06/2024    8:49 AM 10/04/2023    8:57 AM 05/17/2023    9:41 AM 02/10/2023    9:36 AM 02/02/2023    8:52 AM  Depression screen PHQ 2/9  Decreased Interest 0 0 0 0 0  Down, Depressed, Hopeless 0 0 0 0 0  PHQ - 2 Score 0 0 0 0 0  Altered sleeping   0    Tired, decreased energy   0    Change in appetite   0    Feeling bad or failure about yourself    0    Trouble concentrating   1    Moving slowly or fidgety/restless   0    Suicidal thoughts   0    PHQ-9 Score   1      Assessment/Plan: Schizoaffective disorder, bipolar type (HCC) - Plan: divalproex  (DEPAKOTE  ER) 500 MG 24 hr tablet, QUEtiapine  (SEROQUEL ) 50 MG tablet  Tetrahydrocannabinol (THC) use disorder, mild, abuse - Plan: divalproex  (DEPAKOTE  ER) 500 MG 24 hr tablet  Patient is 43 year old African-American man with history of schizo affective disorder, bipolar type, cannabis use, alcohol use, possible SVT and currently taking Seroquel  and Depakote .  Reviewed blood work results.  Emphasis given to discontinue cannabis use and alcohol due to interaction with psychotropic medication.  Patient shows limited insight as he feels it keeps him calm.  However he had cut down from the past.  We will do Depakote  level.  Reviewed blood work results, collect information and current medication from other provider.  Hemoglobin A1c 5.9 which is stable.  Recommend to call back with any question or any concern.  Follow-up in 3 months.   Follow Up Instructions:     I discussed the assessment and treatment plan with the patient. The patient was provided an opportunity to ask questions and all were answered. The  patient agreed with the plan and demonstrated an understanding of the instructions.   The patient was advised to call back or seek an in-person evaluation if the symptoms worsen or if the condition fails to improve as anticipated.    Collaboration of Care: Other provider involved in patient's care AEB notes are available in epic to review  Patient/Guardian was advised Release of Information must be  obtained prior to any record release in order to collaborate their care with an outside provider. Patient/Guardian was advised if they have not already done so to contact the registration department to sign all necessary forms in order for us  to release information regarding their care.   Consent: Patient/Guardian gives verbal consent for treatment and assignment of benefits for services provided during this visit. Patient/Guardian expressed understanding and agreed to proceed.     Total encounter time 70 minutes which includes face-to-face time, chart reviewed, care coordination, order entry and documentation during this encounter.   Note: This document was prepared by [employer] voice dictation technology and any errors that results from this process are unintentional.    Leni ONEIDA Client, MD 08/15/2024

## 2024-08-28 ENCOUNTER — Ambulatory Visit (INDEPENDENT_AMBULATORY_CARE_PROVIDER_SITE_OTHER): Payer: MEDICAID | Admitting: Student

## 2024-08-28 ENCOUNTER — Encounter: Payer: Self-pay | Admitting: Student

## 2024-08-28 VITALS — BP 116/77 | HR 65 | Temp 98.0°F | Ht 65.0 in | Wt 132.4 lb

## 2024-08-28 DIAGNOSIS — F1090 Alcohol use, unspecified, uncomplicated: Secondary | ICD-10-CM | POA: Diagnosis not present

## 2024-08-28 DIAGNOSIS — Z23 Encounter for immunization: Secondary | ICD-10-CM | POA: Diagnosis not present

## 2024-08-28 DIAGNOSIS — Z87898 Personal history of other specified conditions: Secondary | ICD-10-CM

## 2024-08-28 DIAGNOSIS — F250 Schizoaffective disorder, bipolar type: Secondary | ICD-10-CM

## 2024-08-28 DIAGNOSIS — Z79899 Other long term (current) drug therapy: Secondary | ICD-10-CM

## 2024-08-28 DIAGNOSIS — R7303 Prediabetes: Secondary | ICD-10-CM

## 2024-08-28 DIAGNOSIS — R002 Palpitations: Secondary | ICD-10-CM | POA: Diagnosis not present

## 2024-08-28 NOTE — Assessment & Plan Note (Signed)
 Patient with past medical history of schizoaffective disorder.  He is currently on Depakote  1500 mg nightly, and quetiapine  50 mg nightly.  No acute concerns for this today.  He recently seen his psychiatrist on August 15, 2024.  His psychiatrist wanted Depakote  levels.  Will obtain them today.  He takes his Depakote  nightly, so we will need to obtain levels in the afternoon.  Plan: - Continue to follow psychiatry - Continue Depakote  1500 mg nightly - Continue quetiapine  50 mg nightly - Follow-up Depakote  level, patient will come back for lab test

## 2024-08-28 NOTE — Patient Instructions (Addendum)
 Shawn Leach,Thank you for allowing me to take part in your care today.  Here are your instructions.  1.  Please come back in 6 months  2.  I have ordered a Depakote  level for you to take in the afternoon, you can come when you have a chance  3.  You have received your flu vaccine today   PLEASE BRING YOUR MEDICATIONS TO EVERY APPOINTMENT  Thank you, Dr. Tobie  If you have any other questions please contact the internal medicine clinic at [phone] If it is after hours, please call the [facility] at [phone] and then ask the person who picks up for the resident on call.

## 2024-08-28 NOTE — Assessment & Plan Note (Signed)
 Patient has a past medical history for prediabetes.  He has been prediabetic for 15 years.  Last A1c in July 2025 was 5.8.  Counseled on lifestyle modifications today.  Plan: - Counseled on lifestyle modification

## 2024-08-28 NOTE — Assessment & Plan Note (Signed)
 Patient with a past medical history of palpitations.  This has been diagnosed as SVT.  He continues to follow-up with cardiology.  His next appointment is on October 22.  Per charting, because he has bradycardia at times, he is not a good candidate for nodal blocker.  He is to continue to follow cardiology.  His last episode of SVT was on August 21, 2024.  He states it lasted about 30 minutes.  I counseled him today on vagal maneuvers.  Plan: - Continue to follow with cardiology - Counseled on vagal maneuvers

## 2024-08-28 NOTE — Progress Notes (Signed)
 CC: Follow-up appointment  HPI:  [name] is a 43 y.o. male with a past medical history of prediabetes, schizoaffective disorder bipolar type who presents for follow-up appointment.  Please see assessment and plan for full HPI.  Medications: Schizoaffective disorder: Quetiapine  50 mg nightly, Depakote  500 mg (3 tablets nightly)  Past Medical History:  Diagnosis Date   Abdominal spasms 12/05/2019   Bipolar 1 disorder (HCC)    Bradycardia    asymptomatic, normal EKG and BMET   Chronic diarrhea of unknown origin 08/25/2021   Gonococcal urethritis    hx in 5/03   High risk homosexual behavior    Hx of relationship with man 2002-2005, no known disease in the partnet   History of psychiatric disorder    unknown prior diagnosis   Schizoaffective disorder, unspecified type (HCC) 03/16/2007   Diagnosed since childhood - teenage years Has been on medications including Depakote  and Seroquel  since then  Symptoms are stable but on disability due to this mental d/o  Late 2014: Fired from [employer] due to aggressive behavior 2015 Started attending [facility] in [place]      Schizophrenia [facility])    Shoulder weakness 02/23/2018   Tympanic membrane rupture    history of     Current Outpatient Medications:    divalproex  (DEPAKOTE  ER) 500 MG 24 hr tablet, Take 3 tablets by mouth every night, Disp: 90 tablet, Rfl: 2   QUEtiapine  (SEROQUEL ) 50 MG tablet, Take 1 tablet (50 mg total) by mouth at bedtime., Disp: 90 tablet, Rfl: 0  Review of Systems:    Negative except for what is stated in HPI  Physical Exam:  Vitals:   08/28/24 0833  BP: 116/77  Pulse: 65  Temp: 98 F (36.7 C)  TempSrc: Oral  SpO2: 94%  Weight: 132 lb 6.4 oz (60.1 kg)  Height: 5' 5 (1.651 m)   General: Patient is sitting comfortably in the room  Head: Normocephalic, atraumatic  Cardio: Regular rate and rhythm, no murmurs, rubs or gallops Pulmonary: Clear to ausculation bilaterally with no rales, rhonchi,  and crackles    Assessment & Plan:   Assessment & Plan Encounter for immunization Flu vaccine administered today. Schizoaffective disorder, bipolar type [facility]) Patient with past medical history of schizoaffective disorder.  He is currently on Depakote  1500 mg nightly, and quetiapine  50 mg nightly.  No acute concerns for this today.  He recently seen his psychiatrist on August 15, 2024.  His psychiatrist wanted Depakote  levels.  Will obtain them today.  He takes his Depakote  nightly, so we will need to obtain levels in the afternoon.  Plan: - Continue to follow psychiatry - Continue Depakote  1500 mg nightly - Continue quetiapine  50 mg nightly - Follow-up Depakote  level, patient will come back for lab test Prediabetes Patient has a past medical history for prediabetes.  He has been prediabetic for 15 years.  Last A1c in July 2025 was 5.8.  Counseled on lifestyle modifications today.  Plan: - Counseled on lifestyle modification Palpitations Patient with a past medical history of palpitations.  This has been diagnosed as SVT.  He continues to follow-up with cardiology.  His next appointment is on October 22.  Per charting, because he has bradycardia at times, he is not a good candidate for nodal blocker.  He is to continue to follow cardiology.  His last episode of SVT was on August 21, 2024.  He states it lasted about 30 minutes.  I counseled him today on vagal maneuvers.  Plan: - Continue to follow with cardiology - Counseled on vagal maneuvers History of alcohol use Patient a past medical history of alcohol use.  He states he is occasionally using still.  He does not drink every day.  He has no concerns about his alcohol use today.  Did counsel him that sometimes alcohol use can cause his SVT to worsen.  Plan: - Counseled on alcohol cessation today   Patient discussed with Dr. Lovie Libby Blanch, DO Internal Medicine Resident PGY-3

## 2024-08-28 NOTE — Assessment & Plan Note (Signed)
 Patient a past medical history of alcohol use.  He states he is occasionally using still.  He does not drink every day.  He has no concerns about his alcohol use today.  Did counsel him that sometimes alcohol use can cause his SVT to worsen.  Plan: - Counseled on alcohol cessation today

## 2024-08-29 NOTE — Progress Notes (Signed)
 Internal Medicine Clinic Attending  Case discussed with the resident at the time of the visit.  We reviewed the resident's history and exam and pertinent patient test results.  I agree with the assessment, diagnosis, and plan of care documented in the resident's note.

## 2024-09-04 ENCOUNTER — Encounter: Payer: Self-pay | Admitting: *Deleted

## 2024-09-05 ENCOUNTER — Encounter: Payer: Self-pay | Admitting: Cardiology

## 2024-09-05 ENCOUNTER — Ambulatory Visit: Payer: MEDICAID | Attending: Cardiology | Admitting: Cardiology

## 2024-09-05 VITALS — BP 126/92 | HR 44 | Ht 65.0 in | Wt 135.8 lb

## 2024-09-05 DIAGNOSIS — R002 Palpitations: Secondary | ICD-10-CM | POA: Diagnosis not present

## 2024-09-05 DIAGNOSIS — I471 Supraventricular tachycardia, unspecified: Secondary | ICD-10-CM | POA: Insufficient documentation

## 2024-09-05 MED ORDER — PROPRANOLOL HCL 10 MG PO TABS: 10.0000 mg | ORAL_TABLET | Freq: Three times a day (TID) | ORAL | 2 refills | Status: AC | PRN

## 2024-09-05 NOTE — Patient Instructions (Addendum)
 Medication Instructions:  Your physician has recommended you make the following change in your medication:  START: Propranolol 10 mg (one tablet) every 8 hours as needed for heart rate greater than 150 bpm or palpations lasting 5 minutes or more  *If you need a refill on your cardiac medications before your next appointment, please call your pharmacy*   Follow-Up: At [facility], you and your health needs are our priority.  As part of our continuing mission to provide you with exceptional heart care, our providers are all part of one team.  This team includes your primary Cardiologist (physician) and Advanced Practice Providers or APPs (Physician Assistants and Nurse Practitioners) who all work together to provide you with the care you need, when you need it.  Your next appointment:   6 month(s)  Provider:   Kardie Tobb, DO

## 2024-09-06 NOTE — Progress Notes (Signed)
 Cardiology Office Note:    Date:  09/06/2024   ID:  Shawn Leach, DOB Aug 15, 1981, MRN [id number]  PCP:  Bernadine Manos, MD  Cardiologist:  Clarice Zulauf, DO  Electrophysiologist:  None   Referring MD: Bernadine Manos, MD    I am doing fine   History of Present Illness:    Shawn Leach is a 43 y.o. male with a hx of bipolar disorder, seizure affective disorder here today for follow-up visit.     Since his last visit he did wear a monitor which showed rare PSVT.   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 During the episode, he felt lightheaded and experienced a sensation of heat inside his body. He managed the episode by putting his arms up and bending over. 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 He can predict an episode by the onset of lightheadedness and rapid heartbeats.  He recalls a previous episode where his blood pressure was too low to start medication. He has increased his water intake, which he believes has been beneficial. He is currently taking Depakote  for seizures.  No chest pain is reported. Palpitations and lightheadedness occur during episodes.  Past Medical History:  Diagnosis Date   Abdominal spasms 12/05/2019   Bipolar 1 disorder (HCC)    Bradycardia    asymptomatic, normal EKG and BMET   Chronic diarrhea of unknown origin 08/25/2021   Gonococcal urethritis    hx in 5/03   High risk homosexual behavior    Hx of relationship with man 2002-2005, no known disease in the partnet   History of psychiatric disorder    unknown prior diagnosis   Schizoaffective disorder, unspecified type (HCC) 03/16/2007   Diagnosed since childhood - teenage years Has been on medications including Depakote  and Seroquel  since then  Symptoms are stable but on  disability due to this mental d/o  Late 2014: Fired from [employer] due to aggressive behavior 2015 Started attending [facility] in [place]      Schizophrenia [facility])    Shoulder weakness 02/23/2018   Tympanic membrane rupture    history of    No past surgical history on file.  Current Medications: Current Meds  Medication Sig   divalproex  (DEPAKOTE  ER) 500 MG 24 hr tablet Take 3 tablets by mouth every night   propranolol (INDERAL) 10 MG tablet Take 1 tablet (10 mg total) by mouth every 8 (eight) hours as needed (For heart rate greater than 150 bpm or palpations lasting 5 minutes or more).   QUEtiapine  (SEROQUEL ) 50 MG tablet Take 1 tablet (50 mg total) by mouth at bedtime.     Allergies:   Patient has no known allergies.   Social History   Socioeconomic History   Marital status: Single    Spouse name: Not on file   Number of children: Not on file   Years of education: Not on file   Highest education level: Not on file  Occupational History   Not on file  Tobacco Use   Smoking status: Former    Types: Cigars    Quit date: 01/14/2011    Years since quitting: 13.6   Smokeless tobacco: Never  Vaping Use   Vaping status: Never Used  Substance and Sexual Activity   Alcohol use: Yes  Comment: occ   Drug use: Yes    Frequency: 2.0 times per week    Types: Marijuana   Sexual activity: Yes    Partners: Female    Comment: disabled on SSI, single, has a girlfriend (now only heterosexual) and 2 daughters,   Other Topics Concern   Not on file  Social History Narrative      Are you right handed or left handed? Right handed    Are you currently employed ? 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 no   Who lives with you? Lives with aunts    What type of home do you live in: 1 story or 2 story? Has steps        Social Drivers of Health   Financial Resource Strain: Medium Risk (06/06/2024)   Overall Financial Resource Strain (CARDIA)     Difficulty of Paying Living Expenses: Somewhat hard  Food Insecurity: No Food Insecurity (02/06/2024)   Hunger Vital Sign    Worried About Running Out of Food in the Last Year: Never true    Ran Out of Food in the Last Year: Never true  Transportation Needs: No Transportation Needs (02/06/2024)   PRAPARE - Administrator, Civil Service (Medical): No    Lack of Transportation (Non-Medical): No  Physical Activity: Sufficiently Active (06/06/2024)   Exercise Vital Sign    Days of Exercise per Week: 5 days    Minutes of Exercise per Session: 100 min  Stress: Stress Concern Present (06/06/2024)   [employer] of Occupational Health - Occupational Stress Questionnaire    Feeling of Stress: To some extent  Social Connections: Moderately Isolated (06/06/2024)   Social Connection and Isolation Panel    Frequency of Communication with Friends and Family: More than three times a week    Frequency of Social Gatherings with Friends and Family: Twice a week    Attends Religious Services: More than 4 times per year    Active Member of [employer] or Organizations: No    Attends Banker Meetings: Never    Marital Status: Never married     Family History: The patient's family history is not on file.  ROS:   Review of Systems  Constitution: Negative for decreased appetite, fever and weight gain.  HENT: Negative for congestion, ear discharge, hoarse voice and sore throat.   Eyes: Negative for discharge, redness, vision loss in right eye and visual halos.  Cardiovascular: Negative for chest pain, dyspnea on exertion, leg swelling, orthopnea and palpitations.  Respiratory: Negative for cough, hemoptysis, shortness of breath and snoring.   Endocrine: Negative for heat intolerance and polyphagia.  Hematologic/Lymphatic: Negative for bleeding problem. Does not bruise/bleed easily.  Skin: Negative for flushing, nail changes, rash and suspicious lesions.  Musculoskeletal: Negative for  arthritis, joint pain, muscle cramps, myalgias, neck pain and stiffness.  Gastrointestinal: Negative for abdominal pain, bowel incontinence, diarrhea and excessive appetite.  Genitourinary: Negative for decreased libido, genital sores and incomplete emptying.  Neurological: Negative for brief paralysis, focal weakness, headaches and loss of balance.  Psychiatric/Behavioral: Negative for altered mental status, depression and suicidal ideas.  Allergic/Immunologic: Negative for HIV exposure and persistent infections.    EKGs/Labs/Other Studies Reviewed:    The following studies were reviewed today:   EKG:  The ekg ordered today demonstrates   Recent Labs: 06/06/2024: ALT 16; BUN 23; Creatinine, Ser 1.08; Hemoglobin 14.6; Platelets 244; Potassium 4.4; Sodium 135  Recent Lipid Panel    Component Value Date/Time   CHOL 194 06/06/2024 0933   TRIG 68 06/06/2024 0933   HDL 94 06/06/2024 0933   CHOLHDL 2.1 06/06/2024 0933   LDLCALC 88 06/06/2024 0933    Physical Exam:    VS:  BP (!) 126/92 (BP Location: Right Arm, Patient Position: Sitting, Cuff Size: Normal)   Pulse (!) 44   Ht 5' 5 (1.651 m)   Wt 135 lb 12.8 oz (61.6 kg)   SpO2 99%   BMI 22.60 kg/m     Wt Readings from Last 3 Encounters:  09/05/24 135 lb 12.8 oz (61.6 kg)  08/28/24 132 lb 6.4 oz (60.1 kg)  06/06/24 129 lb (58.5 kg)     GEN: Well nourished, well developed in no acute distress HEENT: Normal NECK: No JVD; No carotid bruits LYMPHATICS: No lymphadenopathy CARDIAC: S1S2 noted,RRR, no murmurs, rubs, gallops RESPIRATORY:  Clear to auscultation without rales, wheezing or rhonchi  ABDOMEN: Soft, non-tender, non-distended, +bowel sounds, no guarding. EXTREMITIES: No edema, No cyanosis, no clubbing MUSCULOSKELETAL:  No deformity  SKIN: Warm and dry NEUROLOGIC:  Alert and oriented x 3, non-focal PSYCHIATRIC:  Normal affect, good insight  ASSESSMENT:    1. PSVT (paroxysmal supraventricular tachycardia)   2.  Palpitations     PLAN:    Paroxysmal supraventricular tachycardia (SVT) Intermittent SVT episodes with current blood pressure suitable for treatment. Prefers as-needed medication. - Prescribed propranolol 10 mg PRN for SVT episodes. - Instructed to take propranolol with water at symptom onset. - Scheduled follow-up in six months to evaluate episode frequency and medication efficacy.   The patient is in agreement with the above plan. The patient left the office in stable condition.  The patient will follow up in   Medication Adjustments/Labs and Tests Ordered: Current medicines are reviewed at length with the patient today.  Concerns regarding medicines are outlined above.  No orders of the defined types were placed in this encounter.  Meds ordered this encounter  Medications   propranolol (INDERAL) 10 MG tablet    Sig: Take 1 tablet (10 mg total) by mouth every 8 (eight) hours as needed (For heart rate greater than 150 bpm or palpations lasting 5 minutes or more).    Dispense:  270 tablet    Refill:  2    Patient Instructions  Medication Instructions:  Your physician has recommended you make the following change in your medication:  START: Propranolol 10 mg (one tablet) every 8 hours as needed for heart rate greater than 150 bpm or palpations lasting 5 minutes or more  *If you need a refill on your cardiac medications before your next appointment, please call your pharmacy*   Follow-Up: At [facility], you and your health needs are our priority.  As part of our continuing mission to provide you with exceptional heart care, our providers are all part of one team.  This team includes your primary Cardiologist (physician) and Advanced Practice Providers or APPs (Physician Assistants and Nurse Practitioners) who all work together to provide you with the care you need, when you need it.  Your next appointment:   6 month(s)  Provider:   Aster Eckrich, DO           Adopting a Healthy Lifestyle.  Know what a healthy weight is for you (roughly BMI <25) and aim to maintain this   Aim for 7+ servings of fruits and vegetables daily   65-80+ fluid ounces of water or unsweet  tea for healthy kidneys   Limit to max 1 drink of alcohol per day; avoid smoking/tobacco   Limit animal fats in diet for cholesterol and heart health - choose grass fed whenever available   Avoid highly processed foods, and foods high in saturated/trans fats   Aim for low stress - take time to unwind and care for your mental health   Aim for 150 min of moderate intensity exercise weekly for heart health, and weights twice weekly for bone health   Aim for 7-9 hours of sleep daily   When it comes to diets, agreement about the perfect plan isnt easy to find, even among the experts. Experts at the [facility] [employer] developed an idea known as the Healthy Eating Plate. Just imagine a plate divided into logical, healthy portions.   The emphasis is on diet quality:   Load up on vegetables and fruits - one-half of your plate: Aim for color and variety, and remember that potatoes dont count.   Go for whole grains - one-quarter of your plate: Whole wheat, barley, wheat berries, quinoa, oats, brown rice, and foods made with them. If you want pasta, go with whole wheat pasta.   Protein power - one-quarter of your plate: Fish, chicken, beans, and nuts are all healthy, versatile protein sources. Limit red meat.   The diet, however, does go beyond the plate, offering a few other suggestions.   Use healthy plant oils, such as olive, canola, soy, corn, sunflower and peanut. Check the labels, and avoid partially hydrogenated oil, which have unhealthy trans fats.   If youre thirsty, drink water. Coffee and tea are good in moderation, but skip sugary drinks and limit milk and dairy products to one or two daily servings.   The type of carbohydrate in the diet is more important  than the amount. Some sources of carbohydrates, such as vegetables, fruits, whole grains, and beans-are healthier than others.   Finally, stay active  Signed, Dub Huntsman, DO  09/06/2024 3:28 PM    [facility]

## 2024-11-01 ENCOUNTER — Encounter: Payer: Self-pay | Admitting: Student

## 2024-12-13 ENCOUNTER — Other Ambulatory Visit: Payer: Self-pay

## 2024-12-13 ENCOUNTER — Ambulatory Visit: Payer: Self-pay

## 2024-12-13 VITALS — BP 109/65 | HR 68 | Temp 97.7°F | Ht 65.0 in | Wt 137.0 lb

## 2024-12-13 DIAGNOSIS — Z0000 Encounter for general adult medical examination without abnormal findings: Secondary | ICD-10-CM

## 2024-12-13 DIAGNOSIS — F250 Schizoaffective disorder, bipolar type: Secondary | ICD-10-CM | POA: Diagnosis not present

## 2024-12-13 DIAGNOSIS — Z87898 Personal history of other specified conditions: Secondary | ICD-10-CM

## 2024-12-13 MED ORDER — COVID-19 MRNA VAC-TRIS(PFIZER) 30 MCG/0.3ML IM SUSY
0.3000 mL | PREFILLED_SYRINGE | Freq: Once | INTRAMUSCULAR | 0 refills | Status: AC
  Filled 2024-12-13: qty 0.3, 1d supply, fill #0

## 2024-12-13 NOTE — Patient Instructions (Signed)
 It was wonderful seeing you today!   1) Great job at reducing your alcohol intake. Keep up your healthy eating habits and physical activity routine.   2) Today you're getting your valproic acid  level checked. Make sure to go to your psychiatry appointment.   3) You're up to date on your labs and screening. We can see you back in 6 months for a check up appointment.   4) You are due for a COVID booster. You can get this at any pharmacy, including the one across the hall.   We will see you in 6 months, don't hesitate to reach out if you need something in the meantime.   If you have any questions please feel free to the call the clinic at anytime at [phone].  Have a blessed day,  Dr. Charmayne

## 2024-12-13 NOTE — Assessment & Plan Note (Signed)
 Last office visit in 08/2024.  Labs in 05/2024: CBC, lipid profile, and CMP WNL.  A1c was 5.8%, consistent with prediabetes.  Today he feels well, he eats a varied diet consisting of fruit, vegetables, meat and dairy.  His exercise consists of walking and weights.  He lives at home and is on disability for his mental illness.  He denies palpitations, changes in urination or defecation, abdominal pain, recent illness or trauma.  He has cut back a lot on his drinking and says that he could quit anytime, he states that he is not addicted to it.  Most of his vaccines are up-to-date, I recommended that he go across the hall to get his COVID booster.  Also discussed that we do not have documentation of his hepatitis B vaccination series.  I recommended that he reach out to his family to see if he had these vaccinations as a child.  He was born at a hospital and thinks he probably had these when he was young.  Discussed that if he has not had the vaccine or is unable to confirm, we can test him for immunity and/or help him get the vaccine or administer it here.  He has a history of a ruptured tympanic membrane and wanted me to make sure his ears were okay.  Bilateral tympanic membranes are in tact and translucent with visible landmarks.  Discussed the benign exam with patient.  I also reviewed his labs from July with him and recommended that we not repeat labs until 05/2025 unless necessary for a change in his health.  Patient agrees. Plan Repeat basic labs in 05/2025 Follow-up in 6 months or sooner as necessary Follow-up hepatitis B immunity

## 2024-12-13 NOTE — Progress Notes (Signed)
 "  Established Patient Office Visit  Subjective   Patient ID: Shawn Leach, male    DOB: 23-Feb-1981  Age: 44 y.o. MRN: [id number]  Patient presents for 72-month follow-up.  He is doing well and has no complaints.  He does want to make sure that he does not have any fluid in his ears.  See below for medical history as well as problem-based plan assessment.    Past Medical History:  Diagnosis Date   Abdominal spasms 12/05/2019   Bipolar 1 disorder (HCC)    Bradycardia    asymptomatic, normal EKG and BMET   Chronic diarrhea of unknown origin 08/25/2021   Gonococcal urethritis    hx in 5/03   High risk homosexual behavior    Hx of relationship with man 2002-2005, no known disease in the partnet   History of psychiatric disorder    unknown prior diagnosis   Schizoaffective disorder, unspecified type (HCC) 03/16/2007   Diagnosed since childhood - teenage years Has been on medications including Depakote  and Seroquel  since then  Symptoms are stable but on disability due to this mental d/o  Late 2014: Fired from [employer] due to aggressive behavior 2015 Started attending [facility] in [place]      Schizophrenia [facility])    Shoulder weakness 02/23/2018   Tympanic membrane rupture    history of   No past surgical history on file.      Objective:     BP 109/65 (BP Location: Left Arm, Patient Position: Sitting, Cuff Size: Small)   Pulse 68   Temp 97.7 F (36.5 C) (Oral)   Ht 5' 5 (1.651 m)   Wt 137 lb (62.1 kg)   SpO2 99%   BMI 22.80 kg/m  BP Readings from Last 3 Encounters:  12/13/24 109/65  09/05/24 (!) 126/92  08/28/24 116/77   Wt Readings from Last 3 Encounters:  12/13/24 137 lb (62.1 kg)  09/05/24 135 lb 12.8 oz (61.6 kg)  08/28/24 132 lb 6.4 oz (60.1 kg)      Physical Exam Vitals reviewed.  Constitutional:      Appearance: Normal appearance. He is normal weight.  HENT:     Right Ear: Tympanic membrane, ear canal and external ear normal. There is no impacted  cerumen.     Left Ear: Tympanic membrane, ear canal and external ear normal. There is no impacted cerumen.     Nose: Nose normal.     Mouth/Throat:     Mouth: Mucous membranes are moist.     Pharynx: Oropharynx is clear. No oropharyngeal exudate or posterior oropharyngeal erythema.  Eyes:     General: No scleral icterus.       Right eye: No discharge.        Left eye: No discharge.     Extraocular Movements: Extraocular movements intact.     Conjunctiva/sclera: Conjunctivae normal.     Pupils: Pupils are equal, round, and reactive to light.  Cardiovascular:     Rate and Rhythm: Normal rate and regular rhythm.     Pulses: Normal pulses.     Heart sounds: Normal heart sounds.  Pulmonary:     Effort: Pulmonary effort is normal.     Breath sounds: Normal breath sounds.  Abdominal:     General: Bowel sounds are normal. There is no distension.     Palpations: Abdomen is soft.     Tenderness: There is no abdominal tenderness.  Musculoskeletal:        General: Normal range of  motion.     Cervical back: Normal range of motion.  Skin:    General: Skin is warm.  Neurological:     General: No focal deficit present.     Mental Status: He is alert and oriented to person, place, and time.  Psychiatric:        Mood and Affect: Mood normal.        Behavior: Behavior normal.        Thought Content: Thought content normal.        Judgment: Judgment normal.      No results found for any visits on 12/13/24.  Last CBC Lab Results  Component Value Date   WBC 5.0 06/06/2024   HGB 14.6 06/06/2024   HCT 45.3 06/06/2024   MCV 92 06/06/2024   MCH 29.6 06/06/2024   RDW 12.9 06/06/2024   PLT 244 06/06/2024   Last metabolic panel Lab Results  Component Value Date   GLUCOSE 83 06/06/2024   NA 135 06/06/2024   K 4.4 06/06/2024   CL 98 06/06/2024   CO2 21 06/06/2024   BUN 23 06/06/2024   CREATININE 1.08 06/06/2024   EGFR 88 06/06/2024   CALCIUM 9.8 06/06/2024   PROT 7.5 06/06/2024    ALBUMIN 4.6 06/06/2024   LABGLOB 2.9 06/06/2024   AGRATIO 1.7 08/26/2022   BILITOT 0.7 06/06/2024   ALKPHOS 43 (L) 06/06/2024   AST 28 06/06/2024   ALT 16 06/06/2024   ANIONGAP 19 (H) 06/27/2014   Last lipids Lab Results  Component Value Date   CHOL 194 06/06/2024   HDL 94 06/06/2024   LDLCALC 88 06/06/2024   TRIG 68 06/06/2024   CHOLHDL 2.1 06/06/2024   Last hemoglobin A1c Lab Results  Component Value Date   HGBA1C 5.8 (A) 06/06/2024      The 10-year ASCVD risk score (Arnett DK, et al., 2019) is: 2%    Assessment & Plan:   Assessment & Plan Annual physical exam Last office visit in 08/2024.  Labs in 05/2024: CBC, lipid profile, and CMP WNL.  A1c was 5.8%, consistent with prediabetes.  Today he feels well, he eats a varied diet consisting of fruit, vegetables, meat and dairy.  His exercise consists of walking and weights.  He lives at home and is on disability for his mental illness.  He denies palpitations, changes in urination or defecation, abdominal pain, recent illness or trauma.  He has cut back a lot on his drinking and says that he could quit anytime, he states that he is not addicted to it.  Most of his vaccines are up-to-date, I recommended that he go across the hall to get his COVID booster.  Also discussed that we do not have documentation of his hepatitis B vaccination series.  I recommended that he reach out to his family to see if he had these vaccinations as a child.  He was born at a hospital and thinks he probably had these when he was young.  Discussed that if he has not had the vaccine or is unable to confirm, we can test him for immunity and/or help him get the vaccine or administer it here.  He has a history of a ruptured tympanic membrane and wanted me to make sure his ears were okay.  Bilateral tympanic membranes are in tact and translucent with visible landmarks.  Discussed the benign exam with patient.  I also reviewed his labs from July with him and  recommended that we not repeat labs until 05/2025  unless necessary for a change in his health.  Patient agrees. Plan Repeat basic labs in 05/2025 Follow-up in 6 months or sooner as necessary Follow-up hepatitis B immunity    Schizoaffective disorder, bipolar type (HCC) LOV with Dr.Arfeen 08/2024 with upcoming follow-up.  Currently managed on valproic acid  and quetiapine .  Today he is stable and feeling well.  He had an outstanding valproic acid  level ordered through psychiatry, we will have him complete this today in preparation for his upcoming appointment.  He has not taken his valproic acid  today. Plan Continue psychiatry follow-up Continue current medication regimen Orders:   Valproic Acid    Return in about 6 months (around 06/12/2025) for maintenance fu.    Viktoria King, DO "

## 2024-12-13 NOTE — Assessment & Plan Note (Signed)
 LOV with Dr.Arfeen 08/2024 with upcoming follow-up.  Currently managed on valproic acid  and quetiapine .  Today he is stable and feeling well.  He had an outstanding valproic acid  level ordered through psychiatry, we will have him complete this today in preparation for his upcoming appointment.  He has not taken his valproic acid  today. Plan Continue psychiatry follow-up Continue current medication regimen Orders:   Valproic Acid 

## 2024-12-14 ENCOUNTER — Ambulatory Visit: Payer: Self-pay

## 2024-12-14 LAB — VALPROIC ACID LEVEL: Valproic Acid Lvl: <4 ug/mL — ABNORMAL LOW (ref 50–100)

## 2024-12-14 NOTE — Progress Notes (Signed)
 Internal Medicine Clinic Attending  Case discussed with the resident at the time of the visit.  We reviewed the resident's history and exam and pertinent patient test results.  I agree with the assessment, diagnosis, and plan of care documented in the resident's note.

## 2024-12-17 ENCOUNTER — Telehealth (HOSPITAL_COMMUNITY): Payer: MEDICAID | Admitting: Psychiatry

## 2024-12-17 ENCOUNTER — Encounter (HOSPITAL_COMMUNITY): Payer: Self-pay | Admitting: Psychiatry

## 2024-12-17 VITALS — Wt 137.0 lb

## 2024-12-17 DIAGNOSIS — F1210 Cannabis abuse, uncomplicated: Secondary | ICD-10-CM

## 2024-12-17 DIAGNOSIS — F250 Schizoaffective disorder, bipolar type: Secondary | ICD-10-CM | POA: Diagnosis not present

## 2024-12-17 MED ORDER — DIVALPROEX SODIUM ER 500 MG PO TB24: ORAL_TABLET | ORAL | 2 refills | Status: AC

## 2024-12-17 MED ORDER — QUETIAPINE FUMARATE 50 MG PO TABS: 50.0000 mg | ORAL_TABLET | Freq: Every day | ORAL | 0 refills | Status: AC

## 2024-12-17 NOTE — Progress Notes (Signed)
 " [facility] MD Virtual Progress Note   Patient Location: Home Provider Location: Home Office  I connect with patient by video and verified that I am speaking with correct person by using two identifiers. I discussed the limitations of evaluation and management by telemedicine and the availability of in person appointments. I also discussed with the patient that there may be a patient responsible charge related to this service. The patient expressed understanding and agreed to proceed.  Shawn Leach [id number] 44 y.o.  12/17/2024 2:44 PM  History of Present Illness:  Patient is evaluated by video session.  He reported holidays are okay.  He denies any agitation, anger, irritability or severe mood swings.  He continues to smoke marijuana every day and sometime he admitted having a sip of whiskey but denies intoxication or binge.  He had blood work and Depakote  level less than 4.  His level is subtherapeutic past few times.  Though he adamant that he takes the medication every day but not sure why his level is below therapeutic.  He is trying to be as supportive as he can to his family.  He has 42 year old, 42 year old, 55 and 42 year old.  He is not getting into argument with the family members.  Recently had a blood work and seen by PCP.  He also had a visit with his cardiology for SVT and tachycardia.  He has no tremor or shakes or any EPS.  He sleeps good with the help of Seroquel .  Denies any active or passive suicidal thoughts or homicidal thoughts.  Past Psychiatric History: H/O psychiatric illness since young age.  H/O suicidal thoughts, severe anger, mania and hallucination. Saw psychiatrist at the local mental health agency which closed. H/O one brief stay in the emergency room when intoxicated with alcohol. Took Seroquel  (groggy and tired).  No h/o suicidal attempt, nightmares flashback or OCD symptoms.      Past Medical History:  Diagnosis Date   Abdominal spasms  12/05/2019   Bipolar 1 disorder (HCC)    Bradycardia    asymptomatic, normal EKG and BMET   Chronic diarrhea of unknown origin 08/25/2021   Gonococcal urethritis    hx in 5/03   High risk homosexual behavior    Hx of relationship with man 2002-2005, no known disease in the partnet   History of psychiatric disorder    unknown prior diagnosis   Schizoaffective disorder, unspecified type (HCC) 03/16/2007   Diagnosed since childhood - teenage years Has been on medications including Depakote  and Seroquel  since then  Symptoms are stable but on disability due to this mental d/o  Late 2014: Fired from [employer] due to aggressive behavior 2015 Started attending [facility] in [place]      Schizophrenia [facility])    Shoulder weakness 02/23/2018   Tympanic membrane rupture    history of    Outpatient Encounter Medications as of 12/17/2024  Medication Sig   divalproex  (DEPAKOTE  ER) 500 MG 24 hr tablet Take 3 tablets by mouth every night   propranolol  (INDERAL ) 10 MG tablet Take 1 tablet (10 mg total) by mouth every 8 (eight) hours as needed (For heart rate greater than 150 bpm or palpations lasting 5 minutes or more).   QUEtiapine  (SEROQUEL ) 50 MG tablet Take 1 tablet (50 mg total) by mouth at bedtime.   No facility-administered encounter medications on file as of 12/17/2024.    Recent Results (from the past 2160 hours)  Valproic Acid      Status: Abnormal  Collection Time: 12/13/24 11:04 AM  Result Value Ref Range   Valproic Acid  Lvl <4 (L) 50 - 100 ug/mL    Comment: **Verified by repeat analysis**                                 Detection Limit = 4                            <4 indicates None Detected Toxicity may occur at levels of 100-500. Measurements of free unbound valproic acid  may improve the assess- ment of clinical response.      Psychiatric Specialty Exam: Physical Exam  Review of Systems  Weight 137 lb (62.1 kg).There is no height or weight on file to calculate BMI.  General  Appearance: Casual and Fairly Groomed  Eye Contact:  Fair  Speech:  Slow  Volume:  Decreased  Mood:  Euthymic  Affect:  Congruent  Thought Process:  Descriptions of Associations: Intact  Orientation:  Full (Time, Place, and Person)  Thought Content:  WDL  Suicidal Thoughts:  No  Homicidal Thoughts:  No  Memory:  Immediate;   Fair Recent;   Fair Remote;   Fair  Judgement:  Fair  Insight:  Shallow  Psychomotor Activity:  Decreased  Concentration:  Concentration: Fair and Attention Span: Fair  Recall:  [employer] of Knowledge:  Fair  Language:  Good  Akathisia:  No  Handed:  Right  AIMS (if indicated):     Assets:  Communication Skills Desire for Improvement Social Support Transportation  ADL's:  Intact  Cognition:  WNL  Sleep:  ok       08/28/2024    8:34 AM 06/06/2024    8:49 AM 10/04/2023    8:57 AM 05/17/2023    9:41 AM 02/10/2023    9:36 AM  Depression screen PHQ 2/9  Decreased Interest 0 0 0 0 0  Down, Depressed, Hopeless 0 0 0 0 0  PHQ - 2 Score 0 0 0 0 0  Altered sleeping 0   0   Tired, decreased energy 0   0   Change in appetite 0   0   Feeling bad or failure about yourself  0   0   Trouble concentrating 0   1   Moving slowly or fidgety/restless 0   0   Suicidal thoughts 0   0   PHQ-9 Score 0    1    Difficult doing work/chores Not difficult at all         Data saved with a previous flowsheet row definition    Assessment/Plan: Schizoaffective disorder, bipolar type (HCC) - Plan: QUEtiapine  (SEROQUEL ) 50 MG tablet, divalproex  (DEPAKOTE  ER) 500 MG 24 hr tablet  Tetrahydrocannabinol (THC) use disorder, mild, abuse - Plan: divalproex  (DEPAKOTE  ER) 500 MG 24 hr tablet  Patient is 44 year old African-American man with schizoaffective disorder, bipolar type, cannabis use, alcohol use and SVT.  He is taking propranolol  10 mg as needed.  He had no more episodes of dizziness.  Reviewed blood work results.  Depakote  level less than 4.  He likes to keep the Depakote   even though level is subtherapeutic.  There is a questionable history of seizures but patient do not recall but it is noted in the chart.  He does not have a license but sometime he does drive short distance.  He continues smoke  marijuana.  I discussed that he should see neurology to rule out seizures.  If level continue to be subtherapeutic for Depakote  we may consider discontinue however given the questionable history of seizure I like him to follow-up with neurology.  I will forward my note to PCP for neuro referral.  Discussed to stop cannabis and alcohol.  The patient does not feel it is causing any problem but discussed underlying psychiatric illness and psychiatric medication may be counter therapeutic.  His appetite is good.  His weight is stable.  Follow-up in 3 months.  For now continue Depakote  1500 mg daily, Seroquel  50 mg at bedtime.    Follow Up Instructions:     I discussed the assessment and treatment plan with the patient. The patient was provided an opportunity to ask questions and all were answered. The patient agreed with the plan and demonstrated an understanding of the instructions.   The patient was advised to call back or seek an in-person evaluation if the symptoms worsen or if the condition fails to improve as anticipated.    Collaboration of Care: Other provider involved in patient's care AEB notes are available in epic to review  Patient/Guardian was advised Release of Information must be obtained prior to any record release in order to collaborate their care with an outside provider. Patient/Guardian was advised if they have not already done so to contact the registration department to sign all necessary forms in order for us  to release information regarding their care.   Consent: Patient/Guardian gives verbal consent for treatment and assignment of benefits for services provided during this visit. Patient/Guardian expressed understanding and agreed to proceed.      Total encounter time 31 minutes which includes face-to-face time, chart reviewed, care coordination, order entry and documentation during this encounter.   Note: This document was prepared by [employer] voice dictation technology and any errors that results from this process are unintentional.    Leni ONEIDA Client, MD 12/17/2024   "

## 2024-12-20 NOTE — Telephone Encounter (Signed)
 I called pt who stated he was calling to be sure he made the appt with the correct neurologist's office. I informed him he did, with [facility] (he has seen them before).

## 2024-12-20 NOTE — Telephone Encounter (Signed)
 Copied from CRM #[id number]. Topic: General - Other >> Dec 20, 2024  9:14 AM Miquel SAILOR wrote: Reason for CRM: PT needs call back on clarification if he set up right app with Neurology for 01/07/25. Instructed by PCP to call and do so. [phone]

## 2025-01-07 ENCOUNTER — Ambulatory Visit: Payer: Self-pay | Admitting: Neurology

## 2025-03-12 ENCOUNTER — Ambulatory Visit: Admitting: Cardiology
